# Patient Record
Sex: Male | Born: 1945 | Hispanic: No | Marital: Married | Smoking: Former smoker
Health system: Southern US, Community
[De-identification: ages and names within clinical notes are randomized; demographics above are authoritative.]

## PROBLEM LIST (undated history)

## (undated) DIAGNOSIS — K5792 Diverticulitis of intestine, part unspecified, without perforation or abscess without bleeding: Secondary | ICD-10-CM

## (undated) DIAGNOSIS — E785 Hyperlipidemia, unspecified: Secondary | ICD-10-CM

## (undated) DIAGNOSIS — M1711 Unilateral primary osteoarthritis, right knee: Secondary | ICD-10-CM

## (undated) DIAGNOSIS — Z974 Presence of external hearing-aid: Secondary | ICD-10-CM

## (undated) DIAGNOSIS — I10 Essential (primary) hypertension: Secondary | ICD-10-CM

## (undated) DIAGNOSIS — F17200 Nicotine dependence, unspecified, uncomplicated: Secondary | ICD-10-CM

## (undated) DIAGNOSIS — C801 Malignant (primary) neoplasm, unspecified: Secondary | ICD-10-CM

## (undated) DIAGNOSIS — Z973 Presence of spectacles and contact lenses: Secondary | ICD-10-CM

## (undated) DIAGNOSIS — H269 Unspecified cataract: Secondary | ICD-10-CM

## (undated) DIAGNOSIS — I4891 Unspecified atrial fibrillation: Secondary | ICD-10-CM

## (undated) DIAGNOSIS — S0291XA Unspecified fracture of skull, initial encounter for closed fracture: Secondary | ICD-10-CM

## (undated) HISTORY — PX: CATARACT EXTRACTION W/ INTRAOCULAR LENS  IMPLANT, BILATERAL: SHX1307

## (undated) HISTORY — DX: Unspecified cataract: H26.9

## (undated) HISTORY — DX: Hyperlipidemia, unspecified: E78.5

## (undated) HISTORY — DX: Malignant (primary) neoplasm, unspecified: C80.1

## (undated) HISTORY — DX: Essential (primary) hypertension: I10

## (undated) HISTORY — PX: COLONOSCOPY: SHX174

## (undated) HISTORY — PX: EYE SURGERY: SHX253

## (undated) HISTORY — DX: Nicotine dependence, unspecified, uncomplicated: F17.200

---

## 1999-04-29 ENCOUNTER — Observation Stay (HOSPITAL_COMMUNITY): Admission: EM | Admit: 1999-04-29 | Discharge: 1999-04-30 | Payer: Self-pay | Admitting: *Deleted

## 1999-04-29 ENCOUNTER — Encounter: Payer: Self-pay | Admitting: Ophthalmology

## 1999-05-29 ENCOUNTER — Ambulatory Visit (HOSPITAL_COMMUNITY): Admission: RE | Admit: 1999-05-29 | Discharge: 1999-05-30 | Payer: Self-pay | Admitting: Ophthalmology

## 2001-02-25 ENCOUNTER — Other Ambulatory Visit: Admission: RE | Admit: 2001-02-25 | Discharge: 2001-02-25 | Payer: Self-pay | Admitting: Gastroenterology

## 2007-02-05 ENCOUNTER — Ambulatory Visit: Payer: Self-pay | Admitting: Gastroenterology

## 2007-02-17 ENCOUNTER — Ambulatory Visit: Payer: Self-pay | Admitting: Gastroenterology

## 2012-01-23 ENCOUNTER — Other Ambulatory Visit: Payer: Self-pay | Admitting: Family Medicine

## 2012-01-23 ENCOUNTER — Ambulatory Visit
Admission: RE | Admit: 2012-01-23 | Discharge: 2012-01-23 | Disposition: A | Discharge status: Home / Self Care | Payer: 59 | Source: Ambulatory Visit | Attending: Family Medicine | Admitting: Family Medicine

## 2012-01-23 DIAGNOSIS — M25569 Pain in unspecified knee: Secondary | ICD-10-CM

## 2012-02-06 ENCOUNTER — Encounter: Payer: Self-pay | Admitting: Gastroenterology

## 2012-02-10 ENCOUNTER — Other Ambulatory Visit: Payer: Self-pay | Admitting: Family Medicine

## 2012-02-10 DIAGNOSIS — M25562 Pain in left knee: Secondary | ICD-10-CM

## 2012-02-12 ENCOUNTER — Ambulatory Visit
Admission: RE | Admit: 2012-02-12 | Discharge: 2012-02-12 | Disposition: A | Discharge status: Home / Self Care | Payer: Self-pay | Source: Ambulatory Visit | Attending: Family Medicine | Admitting: Family Medicine

## 2012-02-12 DIAGNOSIS — M25562 Pain in left knee: Secondary | ICD-10-CM

## 2012-02-27 DIAGNOSIS — M25569 Pain in unspecified knee: Secondary | ICD-10-CM | POA: Diagnosis not present

## 2012-02-27 DIAGNOSIS — IMO0002 Reserved for concepts with insufficient information to code with codable children: Secondary | ICD-10-CM | POA: Diagnosis not present

## 2012-03-09 HISTORY — PX: KNEE ARTHROSCOPY: SUR90

## 2012-03-18 DIAGNOSIS — M9420 Chondromalacia, unspecified site: Secondary | ICD-10-CM | POA: Diagnosis not present

## 2012-03-18 DIAGNOSIS — S83289A Other tear of lateral meniscus, current injury, unspecified knee, initial encounter: Secondary | ICD-10-CM | POA: Diagnosis not present

## 2012-03-18 DIAGNOSIS — M2240 Chondromalacia patellae, unspecified knee: Secondary | ICD-10-CM | POA: Diagnosis not present

## 2012-03-18 DIAGNOSIS — M2390 Unspecified internal derangement of unspecified knee: Secondary | ICD-10-CM | POA: Diagnosis not present

## 2012-03-18 DIAGNOSIS — X58XXXA Exposure to other specified factors, initial encounter: Secondary | ICD-10-CM | POA: Diagnosis not present

## 2012-03-18 DIAGNOSIS — IMO0002 Reserved for concepts with insufficient information to code with codable children: Secondary | ICD-10-CM | POA: Diagnosis not present

## 2012-03-18 DIAGNOSIS — Y929 Unspecified place or not applicable: Secondary | ICD-10-CM | POA: Diagnosis not present

## 2012-07-07 DIAGNOSIS — I10 Essential (primary) hypertension: Secondary | ICD-10-CM | POA: Diagnosis not present

## 2012-07-07 DIAGNOSIS — E785 Hyperlipidemia, unspecified: Secondary | ICD-10-CM | POA: Diagnosis not present

## 2012-07-08 DIAGNOSIS — Z125 Encounter for screening for malignant neoplasm of prostate: Secondary | ICD-10-CM | POA: Diagnosis not present

## 2012-07-08 DIAGNOSIS — I10 Essential (primary) hypertension: Secondary | ICD-10-CM | POA: Diagnosis not present

## 2012-07-08 DIAGNOSIS — E559 Vitamin D deficiency, unspecified: Secondary | ICD-10-CM | POA: Diagnosis not present

## 2012-07-08 DIAGNOSIS — E785 Hyperlipidemia, unspecified: Secondary | ICD-10-CM | POA: Diagnosis not present

## 2012-07-21 ENCOUNTER — Encounter: Payer: Self-pay | Admitting: Gastroenterology

## 2012-08-27 ENCOUNTER — Ambulatory Visit (AMBULATORY_SURGERY_CENTER): Payer: Medicare Other | Admitting: *Deleted

## 2012-08-27 VITALS — Ht 66.0 in | Wt 177.9 lb

## 2012-08-27 DIAGNOSIS — Z1211 Encounter for screening for malignant neoplasm of colon: Secondary | ICD-10-CM

## 2012-08-27 MED ORDER — MOVIPREP 100 G PO SOLR: ORAL | Status: DC

## 2012-09-10 ENCOUNTER — Ambulatory Visit (AMBULATORY_SURGERY_CENTER): Payer: Medicare Other | Admitting: Gastroenterology

## 2012-09-10 ENCOUNTER — Encounter: Payer: Self-pay | Admitting: Gastroenterology

## 2012-09-10 VITALS — BP 142/76 | HR 65 | Temp 97.3°F | Resp 18 | Ht 66.0 in | Wt 177.0 lb

## 2012-09-10 DIAGNOSIS — Z1211 Encounter for screening for malignant neoplasm of colon: Secondary | ICD-10-CM

## 2012-09-10 DIAGNOSIS — I10 Essential (primary) hypertension: Secondary | ICD-10-CM | POA: Diagnosis not present

## 2012-09-10 DIAGNOSIS — E785 Hyperlipidemia, unspecified: Secondary | ICD-10-CM | POA: Diagnosis not present

## 2012-09-10 DIAGNOSIS — D126 Benign neoplasm of colon, unspecified: Secondary | ICD-10-CM

## 2012-09-10 DIAGNOSIS — Z860100 Personal history of colon polyps, unspecified: Secondary | ICD-10-CM

## 2012-09-10 MED ORDER — SODIUM CHLORIDE 0.9 % IV SOLN: 500.0000 mL | INTRAVENOUS | Status: DC

## 2012-09-10 NOTE — Progress Notes (Signed)
B/p low once before procedure started.  Rechecked and back to baseline (malpositioning of cuff).

## 2012-09-10 NOTE — Patient Instructions (Addendum)

## 2012-09-10 NOTE — Op Note (Signed)
[facility] [address] [place], [address]   COLONOSCOPY PROCEDURE REPORT  PATIENT: Nicholas Huffman, Nicholas Huffman  MR#: [id number] BIRTHDATE: [birth year] , 66  yrs. old GENDER: Male ENDOSCOPIST: Meryl Dare, MD, [facility]  PROCEDURE DATE:  09/10/2012 PROCEDURE:   Colonoscopy with snare polypectomy ASA CLASS:   Class II INDICATIONS: patient's personal history of adenomatous colon polyps, 2002, 2008. MEDICATIONS: MAC sedation, administered by CRNA and propofol (Diprivan) 400mg  IV DESCRIPTION OF PROCEDURE:   After the risks benefits and alternatives of the procedure were thoroughly explained, informed consent was obtained.  A digital rectal exam revealed no abnormalities of the rectum.   The LB [id number]  endoscope was introduced through the anus and advanced to the cecum, which was identified by both the appendix and ileocecal valve. No adverse events experienced.   The quality of the prep was adequate, using MoviPrep  The instrument was then slowly withdrawn as the colon was fully examined.    COLON FINDINGS: A sessile polyp measuring 1.6 cm was found at the cecum.  A piecemeal polypectomy was performed using snare cautery. The resection appeared to be complete and the polyp tissue was completely retrieved.   A sessile polyp measuring 2 cm in size was found in the ascending colon.  A piecemeal polypectomy was performed using snare cautery.  The resection appeared to be complete and the polyp tissue was completely retrieved.   A sessile polyp measuring 1.5 cm in size was found in the proximal transverse colon.  A piecemeal polypectomy was performed using snare cautery. The resection appeared to be complete and the polyp tissue was completely retrieved.   A sessile polyp measuring 1.5 cm in size was found in the distal transverse colon.  A piecemeal polypectomy was performed using snare cautery. The resection appeared to be complete and the polyps tissue was  completely retrieved. 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   A small angiodysplastic lesion was found in the ascending colon. The colon was otherwise normal.  There was no diverticulosis, inflammation, polyps or cancers unless previously stated.  Mild diverticulosis was noted in the sigmoid colon.  Retroflexed views revealed no abnormalities. The time to cecum=3 minutes 57 seconds.  Withdrawal time=20 minutes 00 seconds.  The scope was withdrawn and the procedure completed.   COMPLICATIONS: There were no complications.  ENDOSCOPIC IMPRESSION: 1.   Sessile polyp was found at the cecum; polypectomy was performed using snare cautery 2.   Sessile polyp was found in the ascending colon; polypectomy was performed using snare cautery 3.   Sessile polyp was foundin the proximal transverse colon; polypectomy was performed using snare cautery 4.   Sessile polyp was found in the distal transverse colon; polypectomy was performed using snare cautery 5.   3 medium sized angiodysplastic lesion at the cecum 6.   Small sized angiodysplastic lesion in the ascending colon 7.   Mild diverticulosis was noted in the sigmoid colon  RECOMMENDATIONS: 1.  Hold aspirin, aspirin products, and anti-inflammatory medication for 2 weeks. 2.  Await pathology results 3.  Repeat Colonoscopy in 6 months pending pathology review.   eSigned:  Meryl Dare, MD, [facility] 09/10/2012 9:51 AM   cc: Lynnea Ferrier, MD   PATIENT NAME:  Nicholas Huffman, Nicholas Huffman MR#: [id number]

## 2012-09-11 ENCOUNTER — Telehealth: Payer: Self-pay | Admitting: *Deleted

## 2012-09-11 NOTE — Telephone Encounter (Signed)
  Follow up Call-  Call back number 09/10/2012  Post procedure Call Back phone  # [phone]  Permission to leave phone message Yes   Spoke with pt's wife  Patient questions:  Do you have a fever, pain , or abdominal swelling? no Pain Score  0 *  Have you tolerated food without any problems? yes  Have you been able to return to your normal activities? yes  Do you have any questions about your discharge instructions: Diet   no Medications  no Follow up visit  no  Do you have questions or concerns about your Care? no  Actions: * If pain score is 4 or above: No action needed, pain <4.

## 2012-09-16 ENCOUNTER — Encounter: Payer: Self-pay | Admitting: Gastroenterology

## 2012-10-20 DIAGNOSIS — M25569 Pain in unspecified knee: Secondary | ICD-10-CM | POA: Diagnosis not present

## 2012-11-10 ENCOUNTER — Other Ambulatory Visit: Payer: Self-pay | Admitting: Family Medicine

## 2012-11-10 DIAGNOSIS — M25561 Pain in right knee: Secondary | ICD-10-CM

## 2012-11-14 ENCOUNTER — Ambulatory Visit
Admission: RE | Admit: 2012-11-14 | Discharge: 2012-11-14 | Disposition: A | Discharge status: Home / Self Care | Payer: Medicare Other | Source: Ambulatory Visit | Attending: Family Medicine | Admitting: Family Medicine

## 2012-11-14 DIAGNOSIS — M25469 Effusion, unspecified knee: Secondary | ICD-10-CM | POA: Diagnosis not present

## 2012-11-14 DIAGNOSIS — M7120 Synovial cyst of popliteal space [Baker], unspecified knee: Secondary | ICD-10-CM | POA: Diagnosis not present

## 2012-11-14 DIAGNOSIS — M1710 Unilateral primary osteoarthritis, unspecified knee: Secondary | ICD-10-CM | POA: Diagnosis not present

## 2012-11-14 DIAGNOSIS — M25561 Pain in right knee: Secondary | ICD-10-CM

## 2012-12-03 DIAGNOSIS — M2390 Unspecified internal derangement of unspecified knee: Secondary | ICD-10-CM | POA: Diagnosis not present

## 2012-12-07 DIAGNOSIS — M9420 Chondromalacia, unspecified site: Secondary | ICD-10-CM | POA: Diagnosis not present

## 2012-12-07 DIAGNOSIS — M2390 Unspecified internal derangement of unspecified knee: Secondary | ICD-10-CM | POA: Diagnosis not present

## 2012-12-07 DIAGNOSIS — M23305 Other meniscus derangements, unspecified medial meniscus, unspecified knee: Secondary | ICD-10-CM | POA: Diagnosis not present

## 2012-12-07 DIAGNOSIS — M23302 Other meniscus derangements, unspecified lateral meniscus, unspecified knee: Secondary | ICD-10-CM | POA: Diagnosis not present

## 2012-12-07 DIAGNOSIS — M2240 Chondromalacia patellae, unspecified knee: Secondary | ICD-10-CM | POA: Diagnosis not present

## 2013-01-08 DIAGNOSIS — Z961 Presence of intraocular lens: Secondary | ICD-10-CM | POA: Diagnosis not present

## 2013-01-18 DIAGNOSIS — J40 Bronchitis, not specified as acute or chronic: Secondary | ICD-10-CM | POA: Diagnosis not present

## 2013-01-18 DIAGNOSIS — J069 Acute upper respiratory infection, unspecified: Secondary | ICD-10-CM | POA: Diagnosis not present

## 2013-04-01 ENCOUNTER — Encounter: Payer: Self-pay | Admitting: Gastroenterology

## 2013-04-19 ENCOUNTER — Encounter: Payer: Self-pay | Admitting: Family Medicine

## 2013-04-19 ENCOUNTER — Ambulatory Visit: Payer: Self-pay | Admitting: Family Medicine

## 2013-04-19 ENCOUNTER — Ambulatory Visit (INDEPENDENT_AMBULATORY_CARE_PROVIDER_SITE_OTHER): Payer: Medicare Other | Admitting: Family Medicine

## 2013-04-19 VITALS — BP 120/64 | HR 68 | Temp 98.2°F | Resp 14 | Wt 176.0 lb

## 2013-04-19 DIAGNOSIS — M25561 Pain in right knee: Secondary | ICD-10-CM

## 2013-04-19 DIAGNOSIS — M25569 Pain in unspecified knee: Secondary | ICD-10-CM

## 2013-04-19 DIAGNOSIS — E785 Hyperlipidemia, unspecified: Secondary | ICD-10-CM | POA: Insufficient documentation

## 2013-04-19 DIAGNOSIS — I10 Essential (primary) hypertension: Secondary | ICD-10-CM | POA: Insufficient documentation

## 2013-04-19 DIAGNOSIS — F17200 Nicotine dependence, unspecified, uncomplicated: Secondary | ICD-10-CM | POA: Insufficient documentation

## 2013-04-19 NOTE — Progress Notes (Signed)
  Subjective:    Patient ID: Nicholas Huffman, male    DOB: 1946-11-25, 67 y.o.   MRN: [id number]  HPI  Patient is here with one month of progressively worsening right knee pain. The pain is located over his medial joint line. He is a history of a medial meniscal tear. This was discovered on an MRI in November of 2013.  He underwent arthroscopy and repair. He's done well with pain in that knee for up until one month ago.  Now he complains of medial joint line pain and tenderness. He reports effusion and warmth around the knee. The pain is worse with twisting motions. It is worse with prolonged walking and standing. It is worse rising from a seated position. He denies any specific injury. 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 Past Medical History  Diagnosis Date  . Hyperlipidemia   . Hypertension   . Smoker    Current Outpatient Prescriptions on File Prior to Visit  Medication Sig Dispense Refill  . amLODipine (NORVASC) 10 MG tablet Take 10 mg by mouth daily.       Marland Kitchen atorvastatin (LIPITOR) 40 MG tablet Take 40 mg by mouth daily.       Marland Kitchen losartan-hydrochlorothiazide (HYZAAR) 100-25 MG per tablet Take 1 tablet by mouth daily.        No current facility-administered medications on file prior to visit.   No Known Allergies   Review of Systems Review of systems is otherwise negative    Objective:   Physical Exam  Cardiovascular: Normal rate, regular rhythm and normal heart sounds.   No murmur heard. Pulmonary/Chest: Effort normal and breath sounds normal. No respiratory distress. He has no wheezes. He has no rales.   right knee is warm to the touch. He has a slight effusion. He has no laxity to varus or valgus stress. Has a negative anterior posterior drawer sign. Has a negative Apley grind test. However he has tenderness over the medial joint line.        Assessment & Plan:  1. Knee pain, acute, right I explained to the  patient that I feel this is likely due to osteoarthritis and not a repeat injury to his meniscus. However he has an orthopedist. Rather than perform a cortisone injection here, I deferred this to his orthopedist's expert opinion. I recommended the patient contact his orthopedist and arrange a followup appointment.  He has tried ibuprofen over-the-counter without success. Patient states he will cause orthopedist and arrange his followup.

## 2013-04-27 DIAGNOSIS — M25569 Pain in unspecified knee: Secondary | ICD-10-CM | POA: Diagnosis not present

## 2013-07-25 ENCOUNTER — Other Ambulatory Visit: Payer: Self-pay | Admitting: Family Medicine

## 2013-09-08 ENCOUNTER — Encounter: Payer: Self-pay | Admitting: Gastroenterology

## 2013-11-09 ENCOUNTER — Ambulatory Visit (AMBULATORY_SURGERY_CENTER): Payer: Self-pay

## 2013-11-09 VITALS — Ht 66.0 in | Wt 183.4 lb

## 2013-11-09 DIAGNOSIS — Z86010 Personal history of colonic polyps: Secondary | ICD-10-CM

## 2013-11-09 MED ORDER — MOVIPREP 100 G PO SOLR: ORAL | Status: DC

## 2013-11-23 ENCOUNTER — Encounter: Payer: Self-pay | Admitting: Gastroenterology

## 2013-11-23 ENCOUNTER — Ambulatory Visit (AMBULATORY_SURGERY_CENTER): Payer: Medicare Other | Admitting: Gastroenterology

## 2013-11-23 VITALS — BP 123/66 | HR 65 | Temp 97.3°F | Resp 21 | Ht 66.0 in | Wt 183.0 lb

## 2013-11-23 DIAGNOSIS — Z1211 Encounter for screening for malignant neoplasm of colon: Secondary | ICD-10-CM | POA: Diagnosis not present

## 2013-11-23 DIAGNOSIS — D126 Benign neoplasm of colon, unspecified: Secondary | ICD-10-CM | POA: Diagnosis not present

## 2013-11-23 DIAGNOSIS — I10 Essential (primary) hypertension: Secondary | ICD-10-CM | POA: Diagnosis not present

## 2013-11-23 DIAGNOSIS — Z86010 Personal history of colonic polyps: Secondary | ICD-10-CM | POA: Diagnosis not present

## 2013-11-23 MED ORDER — SODIUM CHLORIDE 0.9 % IV SOLN: 500.0000 mL | INTRAVENOUS | Status: DC

## 2013-11-23 NOTE — Progress Notes (Signed)
Called to room to assist during endoscopic procedure.  Patient ID and intended procedure confirmed with present staff. Received instructions for my participation in the procedure from the performing physician.  

## 2013-11-23 NOTE — Progress Notes (Signed)
Patient did not have preoperative order for IV antibiotic SSI prophylaxis. (G8918)  Patient did not experience any of the following events: a burn prior to discharge; a fall within the facility; wrong site/side/patient/procedure/implant event; or a hospital transfer or hospital admission upon discharge from the facility. (G8907)  

## 2013-11-23 NOTE — Op Note (Signed)
[facility] [address] [place], [address]   COLONOSCOPY PROCEDURE REPORT PATIENT: [name], [name]  MR#: [id number] BIRTHDATE: [birth year] , 67  yrs. old GENDER: Male ENDOSCOPIST: Meryl Dare, MD, [facility] PROCEDURE DATE:  11/23/2013 PROCEDURE:   Colonoscopy with snare polypectomy First Screening Colonoscopy - Avg.  risk and is 50 yrs.  old or older - No.  Prior Negative Screening - Now for repeat screening. N/A  History of Adenoma - Now for follow-up colonoscopy & has been > or = to 3 yrs.  Yes hx of adenoma.  Has been 3 or more years since last colonoscopy.  Polyps Removed Today? Yes. ASA CLASS:   Class II INDICATIONS:Patient's personal history of adenomatous colon polyps.  MEDICATIONS: MAC sedation, administered by CRNA and propofol (Diprivan) 350mg  IV DESCRIPTION OF PROCEDURE:   After the risks benefits and alternatives of the procedure were thoroughly explained, informed consent was obtained.  A digital rectal exam revealed no abnormalities of the rectum.   The LB [id number]  endoscope was introduced through the anus and advanced to the cecum, which was identified by both the appendix and ileocecal valve. No adverse events experienced.   The quality of the prep was adequate, using MoviPrep  The instrument was then slowly withdrawn as the colon was fully examined.  COLON FINDINGS: Multiple AVMs were noted at the cecum and in the ascending colon.  Two sessile polyps measuring 5-6 mm in size were found at the hepatic flexure.  A polypectomy was performed with a cold snare.  The resection was complete and the polyp tissue was completely retrieved.   Two sessile polyps measuring 5-8 mm in size were found in the transverse colon.  A polypectomy was performed using snare cautery and with a cold snare.  The resection was complete and the polyp tissue was completely retrieved.   A sessile polyp measuring 5 mm in size was found in the descending colon.   A polypectomy was performed with a cold snare.  The resection was complete and the polyp tissue was completely retrieved.   The colon was otherwise normal.  There was no diverticulosis, inflammation, polyps or cancers unless previously stated.  Retroflexed views revealed moderate internal hemorrhoids and a hypertrophed anal papillae. The time to cecum=3 minutes 47 seconds.  Withdrawal time=12 minutes 12 seconds.  The scope was withdrawn and the procedure completed. COMPLICATIONS: There were no complications.  ENDOSCOPIC IMPRESSION: 1.   Multiple AVMs at the cecum and in the ascending colon 2.   Two sessile polyps measuring 5-6 mm at the hepatic flexure; polypectomy performed with a cold snare 3.   Two sessile polyps measuring 5-8 mm in the transverse colon; polypectomy performed using snare cautery and with a cold snare 4.   Sessile polyp measuring 5 mm in the descending colon; polypectomy performed with a cold snare 5.   Internal hemorrhoids and hypertrophied anal papillae  RECOMMENDATIONS: 1.  Await pathology results 2.  Repeat Colonoscopy in 3 years if 3 or more polyps adenomatous; otherwise 5 years.   eSigned:  Meryl Dare, MD, [facility] 11/23/2013 9:38 AM   cc: Lynnea Ferrier, MD   PATIENT NAME:  Nicholas Huffman, Nicholas Huffman MR#: [id number]

## 2013-11-23 NOTE — Patient Instructions (Signed)

## 2013-11-24 ENCOUNTER — Telehealth: Payer: Self-pay | Admitting: *Deleted

## 2013-11-24 NOTE — Telephone Encounter (Signed)
  Follow up Call-  Call back number 11/23/2013 09/10/2012  Post procedure Call Back phone  # [phone]  Permission to leave phone message Yes Yes     Patient questions:  Do you have a fever, pain , or abdominal swelling? no Pain Score  0 *  Have you tolerated food without any problems? yes  Have you been able to return to your normal activities? yes  Do you have any questions about your discharge instructions: Diet   no Medications  no Follow up visit  no  Do you have questions or concerns about your Care? no  Actions: * If pain score is 4 or above: No action needed, pain <4.  Stated everything was great.

## 2013-11-30 ENCOUNTER — Encounter: Payer: Self-pay | Admitting: Gastroenterology

## 2014-01-04 ENCOUNTER — Other Ambulatory Visit: Payer: Self-pay | Admitting: Family Medicine

## 2014-02-07 ENCOUNTER — Other Ambulatory Visit: Payer: Self-pay | Admitting: Family Medicine

## 2014-02-08 NOTE — Telephone Encounter (Signed)
Medication filled x1 with no refills.   Requires office visit before any further refills can be given.   Letter sent.  

## 2014-02-21 ENCOUNTER — Encounter: Payer: Self-pay | Admitting: Family Medicine

## 2014-02-21 ENCOUNTER — Other Ambulatory Visit: Payer: Self-pay | Admitting: Family Medicine

## 2014-02-21 ENCOUNTER — Ambulatory Visit (INDEPENDENT_AMBULATORY_CARE_PROVIDER_SITE_OTHER): Payer: Medicare Other | Admitting: Family Medicine

## 2014-02-21 VITALS — BP 118/60 | HR 76 | Temp 98.2°F | Resp 14 | Ht 67.0 in | Wt 184.0 lb

## 2014-02-21 DIAGNOSIS — I10 Essential (primary) hypertension: Secondary | ICD-10-CM | POA: Diagnosis not present

## 2014-02-21 DIAGNOSIS — E785 Hyperlipidemia, unspecified: Secondary | ICD-10-CM

## 2014-02-21 LAB — LIPID PANEL
CHOL/HDL RATIO: 2.7 ratio
CHOLESTEROL: 102 mg/dL (ref 0–200)
HDL: 38 mg/dL — ABNORMAL LOW (ref >39)
LDL CALC: 43 mg/dL (ref 0–99)
TRIGLYCERIDES: 103 mg/dL (ref <150)
VLDL: 21 mg/dL (ref 0–40)

## 2014-02-21 LAB — COMPLETE METABOLIC PANEL WITH GFR
ALK PHOS: 57 U/L (ref 39–117)
ALT: 22 U/L (ref 0–53)
AST: 21 U/L (ref 0–37)
Albumin: 4.4 g/dL (ref 3.5–5.2)
BUN: 23 mg/dL (ref 6–23)
CO2: 26 mEq/L (ref 19–32)
CREATININE: 1.17 mg/dL (ref 0.50–1.35)
Calcium: 9.7 mg/dL (ref 8.4–10.5)
Chloride: 102 mEq/L (ref 96–112)
GFR, Est African American: 74 mL/min
GFR, Est Non African American: 64 mL/min
Glucose, Bld: 103 mg/dL — ABNORMAL HIGH (ref 70–99)
POTASSIUM: 3.8 meq/L (ref 3.5–5.3)
Sodium: 136 mEq/L (ref 135–145)
Total Bilirubin: 0.7 mg/dL (ref 0.2–1.2)
Total Protein: 6.9 g/dL (ref 6.0–8.3)

## 2014-02-21 LAB — CBC WITH DIFFERENTIAL/PLATELET
BASOS ABS: 0.1 10*3/uL (ref 0.0–0.1)
Basophils Relative: 1 % (ref 0–1)
EOS ABS: 0.3 10*3/uL (ref 0.0–0.7)
Eosinophils Relative: 4 % (ref 0–5)
HCT: 48.2 % (ref 39.0–52.0)
HEMOGLOBIN: 16.7 g/dL (ref 13.0–17.0)
Lymphocytes Relative: 22 % (ref 12–46)
Lymphs Abs: 1.7 10*3/uL (ref 0.7–4.0)
MCH: 30.6 pg (ref 26.0–34.0)
MCHC: 34.6 g/dL (ref 30.0–36.0)
MCV: 88.4 fL (ref 78.0–100.0)
MONOS PCT: 7 % (ref 3–12)
Monocytes Absolute: 0.6 10*3/uL (ref 0.1–1.0)
Neutro Abs: 5.2 10*3/uL (ref 1.7–7.7)
Neutrophils Relative %: 66 % (ref 43–77)
PLATELETS: 300 10*3/uL (ref 150–400)
RBC: 5.45 MIL/uL (ref 4.22–5.81)
RDW: 14.1 % (ref 11.5–15.5)
WBC: 7.9 10*3/uL (ref 4.0–10.5)

## 2014-02-21 MED ORDER — LOSARTAN POTASSIUM-HCTZ 100-25 MG PO TABS: ORAL_TABLET | ORAL | Status: DC

## 2014-02-21 MED ORDER — ATORVASTATIN CALCIUM 40 MG PO TABS: ORAL_TABLET | ORAL | Status: DC

## 2014-02-21 MED ORDER — AMLODIPINE BESYLATE 10 MG PO TABS: 10.0000 mg | ORAL_TABLET | Freq: Every day | ORAL | Status: DC

## 2014-02-21 NOTE — Progress Notes (Signed)
   Subjective:    Patient ID: Nicholas Huffman, male    DOB: 1946/07/29, 68 y.o.   MRN: [id number]  HPI  Patient is a very pleasant 68 year old white male who comes in today for followup of his hypertension and hyperlipidemia. His medication list is reviewed. He denies any chest pain shortness of breath or dyspnea on exertion. He denies any myalgia right upper quadrant pain. The patient had Pneumovax 23. He is due for the shingles vaccine as well as Prevnar. His colonoscopy is up to date. His prostate cancer screening is up to date. He has no concerns today. He continues to smoke and is in the pre-contemplative phase to smoking cessation.  He is not interested in quitting strategies at this time. Past Medical History  Diagnosis Date  . Hyperlipidemia   . Hypertension   . Smoker    No current outpatient prescriptions on file prior to visit.   No current facility-administered medications on file prior to visit.   No Known Allergies History   Social History  . Marital Status: Married    Spouse Name: N/A    Number of Children: N/A  . Years of Education: N/A   Occupational History  . Not on file.   Social History Main Topics  . Smoking status: Current Every Day Smoker -- 0.80 packs/day    Types: Cigarettes  . Smokeless tobacco: Never Used  . Alcohol Use: No  . Drug Use: No  . Sexual Activity: Not on file   Other Topics Concern  . Not on file   Social History Narrative  . No narrative on file     Review of Systems  All other systems reviewed and are negative.       Objective:   Physical Exam  Vitals reviewed. Constitutional: He appears well-developed and well-nourished.  Neck: Neck supple. No JVD present. No thyromegaly present.  Cardiovascular: Normal rate, regular rhythm, normal heart sounds and intact distal pulses.  Exam reveals no gallop and no friction rub.   No murmur heard. Pulmonary/Chest: Effort normal and breath sounds normal. No respiratory distress. He has  no wheezes. He has no rales. He exhibits no tenderness.  Abdominal: Soft. Bowel sounds are normal. He exhibits no distension and no mass. There is no tenderness. There is no rebound and no guarding.  Musculoskeletal: He exhibits no edema.  Lymphadenopathy:    He has no cervical adenopathy.          Assessment & Plan:  1. HTN (hypertension) Patient's blood pressure is outstanding. Continue current medications at the present dosages. - COMPLETE METABOLIC PANEL WITH GFR - Lipid panel - CBC with Differential  2. HLD (hyperlipidemia) Check fasting lipid panel. Given the patient's age, his blood pressure, and his smoking, I recommend a goal LDL less than 100. I also recommended Prevnar 13. I also recommended the shingles vaccine. I did recommend smoking cessation. - COMPLETE METABOLIC PANEL WITH GFR - Lipid panel - CBC with Differential

## 2014-02-21 NOTE — Telephone Encounter (Signed)
Refill appropriate and filled per protocol. 

## 2014-02-24 DIAGNOSIS — H33059 Total retinal detachment, unspecified eye: Secondary | ICD-10-CM | POA: Diagnosis not present

## 2014-02-24 DIAGNOSIS — Z961 Presence of intraocular lens: Secondary | ICD-10-CM | POA: Diagnosis not present

## 2014-04-29 ENCOUNTER — Other Ambulatory Visit: Payer: Self-pay | Admitting: Family Medicine

## 2014-04-29 DIAGNOSIS — E785 Hyperlipidemia, unspecified: Secondary | ICD-10-CM

## 2014-04-29 DIAGNOSIS — I10 Essential (primary) hypertension: Secondary | ICD-10-CM

## 2014-04-29 NOTE — Telephone Encounter (Signed)
Medication refilled per protocol. 

## 2014-11-12 ENCOUNTER — Other Ambulatory Visit: Payer: Self-pay | Admitting: Family Medicine

## 2014-11-18 ENCOUNTER — Encounter: Payer: Self-pay | Admitting: Family Medicine

## 2014-11-18 ENCOUNTER — Ambulatory Visit (INDEPENDENT_AMBULATORY_CARE_PROVIDER_SITE_OTHER): Payer: Medicare Other | Admitting: Family Medicine

## 2014-11-18 VITALS — BP 126/60 | HR 76 | Temp 98.1°F | Resp 18 | Ht 65.5 in | Wt 182.0 lb

## 2014-11-18 DIAGNOSIS — Z23 Encounter for immunization: Secondary | ICD-10-CM | POA: Diagnosis not present

## 2014-11-18 DIAGNOSIS — Z125 Encounter for screening for malignant neoplasm of prostate: Secondary | ICD-10-CM

## 2014-11-18 DIAGNOSIS — E785 Hyperlipidemia, unspecified: Secondary | ICD-10-CM | POA: Diagnosis not present

## 2014-11-18 LAB — COMPLETE METABOLIC PANEL WITH GFR
ALT: 21 U/L (ref 0–53)
AST: 20 U/L (ref 0–37)
Albumin: 4.4 g/dL (ref 3.5–5.2)
Alkaline Phosphatase: 55 U/L (ref 39–117)
BILIRUBIN TOTAL: 0.6 mg/dL (ref 0.2–1.2)
BUN: 22 mg/dL (ref 6–23)
CO2: 26 mEq/L (ref 19–32)
CREATININE: 1.1 mg/dL (ref 0.50–1.35)
Calcium: 9.7 mg/dL (ref 8.4–10.5)
Chloride: 103 mEq/L (ref 96–112)
GFR, Est African American: 79 mL/min
GFR, Est Non African American: 69 mL/min
Glucose, Bld: 94 mg/dL (ref 70–99)
Potassium: 4 mEq/L (ref 3.5–5.3)
Sodium: 140 mEq/L (ref 135–145)
Total Protein: 6.8 g/dL (ref 6.0–8.3)

## 2014-11-18 LAB — LIPID PANEL
Cholesterol: 96 mg/dL (ref 0–200)
HDL: 37 mg/dL — AB (ref >39)
LDL CALC: 38 mg/dL (ref 0–99)
Total CHOL/HDL Ratio: 2.6 Ratio
Triglycerides: 103 mg/dL (ref <150)
VLDL: 21 mg/dL (ref 0–40)

## 2014-11-18 NOTE — Progress Notes (Signed)
   Subjective:    Patient ID: Nicholas Huffman, male    DOB: 04-27-1946, 68 y.o.   MRN: [id number]  HPI Patient is here today for follow-up of his hypertension and hyperlipidemia. Unfortunately he continues to smoke. He denies any chest pain shortness of breath or dyspnea on exertion. He denies any myalgias or right upper quadrant pain on his Lipitor. He is due for a booster on his Pneumovax 23. His blood pressures well controlled today at 126/60. Past Medical History  Diagnosis Date  . Hyperlipidemia   . Hypertension   . Smoker    Past Surgical History  Procedure Laterality Date  . Knee arthroscopy  April/2013    left  . Colonoscopy  2008, 2002    hx polyps/Dr. Fuller Plan   Current Outpatient Prescriptions on File Prior to Visit  Medication Sig Dispense Refill  . amLODipine (NORVASC) 10 MG tablet TAKE 1 TABLET BY MOUTH EVERY DAY 90 tablet 3  . aspirin 81 MG tablet Take 81 mg by mouth daily.    Marland Kitchen atorvastatin (LIPITOR) 40 MG tablet TAKE 1 TABLET DAILY AT 6 PM 90 tablet 0  . losartan-hydrochlorothiazide (HYZAAR) 100-25 MG per tablet TAKE 1 TABLET BY MOUTH ONCE DAILY 90 tablet 0  . Misc Natural Products (OSTEO BI-FLEX ADV DOUBLE ST PO) Take by mouth.     No current facility-administered medications on file prior to visit.   No Known Allergies History   Social History  . Marital Status: Married    Spouse Name: N/A    Number of Children: N/A  . Years of Education: N/A   Occupational History  . Not on file.   Social History Main Topics  . Smoking status: Current Every Day Smoker -- 0.80 packs/day    Types: Cigarettes  . Smokeless tobacco: Never Used  . Alcohol Use: No  . Drug Use: No  . Sexual Activity: Not on file   Other Topics Concern  . Not on file   Social History Narrative      Review of Systems  All other systems reviewed and are negative.      Objective:   Physical Exam  Cardiovascular: Normal rate, regular rhythm and normal heart sounds.   No murmur  heard. Pulmonary/Chest: Effort normal and breath sounds normal. No respiratory distress. He has no wheezes. He has no rales.  Abdominal: Soft. Bowel sounds are normal. He exhibits no distension. There is no tenderness. There is no rebound.  Musculoskeletal: He exhibits no edema.  Vitals reviewed.         Assessment & Plan:  HLD (hyperlipidemia) - Plan: COMPLETE METABOLIC PANEL WITH GFR, Lipid panel  Prostate cancer screening  Need for prophylactic vaccination against Streptococcus pneumoniae (pneumococcus) - Plan: Pneumococcal polysaccharide vaccine 23-valent greater than or equal to 2yo subcutaneous/IM  Patient's blood pressure is well controlled. I will check a fasting lipid panel. Goal LDL cholesterol is less than 100. I strongly encouraged the patient quits. Patient received a booster on Pneumovax 23 today.

## 2014-12-09 HISTORY — PX: BASAL CELL CARCINOMA EXCISION: SHX1214

## 2015-02-19 ENCOUNTER — Other Ambulatory Visit: Payer: Self-pay | Admitting: Family Medicine

## 2015-03-28 DIAGNOSIS — H2000 Unspecified acute and subacute iridocyclitis: Secondary | ICD-10-CM | POA: Diagnosis not present

## 2015-03-30 DIAGNOSIS — H20012 Primary iridocyclitis, left eye: Secondary | ICD-10-CM | POA: Diagnosis not present

## 2015-04-03 DIAGNOSIS — H20012 Primary iridocyclitis, left eye: Secondary | ICD-10-CM | POA: Diagnosis not present

## 2015-04-10 ENCOUNTER — Other Ambulatory Visit: Payer: Self-pay | Admitting: Family Medicine

## 2015-04-10 DIAGNOSIS — H20012 Primary iridocyclitis, left eye: Secondary | ICD-10-CM | POA: Diagnosis not present

## 2015-04-20 DIAGNOSIS — H20012 Primary iridocyclitis, left eye: Secondary | ICD-10-CM | POA: Diagnosis not present

## 2015-06-20 ENCOUNTER — Encounter: Payer: Self-pay | Admitting: Gastroenterology

## 2015-08-14 ENCOUNTER — Other Ambulatory Visit: Payer: Self-pay | Admitting: Family Medicine

## 2015-10-18 DIAGNOSIS — H833X3 Noise effects on inner ear, bilateral: Secondary | ICD-10-CM | POA: Diagnosis not present

## 2015-10-18 DIAGNOSIS — H903 Sensorineural hearing loss, bilateral: Secondary | ICD-10-CM | POA: Diagnosis not present

## 2015-10-30 DIAGNOSIS — H00025 Hordeolum internum left lower eyelid: Secondary | ICD-10-CM | POA: Diagnosis not present

## 2015-12-07 ENCOUNTER — Ambulatory Visit (INDEPENDENT_AMBULATORY_CARE_PROVIDER_SITE_OTHER): Payer: Medicare Other | Admitting: Physician Assistant

## 2015-12-07 ENCOUNTER — Encounter: Payer: Self-pay | Admitting: Physician Assistant

## 2015-12-07 VITALS — BP 136/74 | HR 80 | Temp 99.7°F | Resp 20 | Wt 188.0 lb

## 2015-12-07 DIAGNOSIS — B349 Viral infection, unspecified: Secondary | ICD-10-CM

## 2015-12-07 DIAGNOSIS — B9789 Other viral agents as the cause of diseases classified elsewhere: Secondary | ICD-10-CM

## 2015-12-07 DIAGNOSIS — J988 Other specified respiratory disorders: Principal | ICD-10-CM

## 2015-12-07 MED ORDER — AZITHROMYCIN 250 MG PO TABS: ORAL_TABLET | ORAL | Status: DC

## 2015-12-07 NOTE — Progress Notes (Signed)
    Patient ID: Nicholas Huffman MRN: [id number], DOB: [birth year], 69 y.o. Date of Encounter: 12/07/2015, 3:46 PM    Chief Complaint:  Chief Complaint  Patient presents with  . sick x 1 day    sinus infection,cough     HPI: 69 y.o. year old white male says the symptoms just started yesterday. Cough and runny nose. Taking NyQuil with minimal relief. No known fever. No significant sore throat. No known sick contacts.     Home Meds:   Outpatient Prescriptions Prior to Visit  Medication Sig Dispense Refill  . amLODipine (NORVASC) 10 MG tablet TAKE 1 TABLET BY MOUTH EVERY DAY 90 tablet 3  . aspirin 81 MG tablet Take 81 mg by mouth daily.    Marland Kitchen atorvastatin (LIPITOR) 40 MG tablet TAKE 1 TABLET DAILY AT 6 PM 90 tablet 1  . losartan-hydrochlorothiazide (HYZAAR) 100-25 MG per tablet TAKE 1 TABLET BY MOUTH ONCE DAILY 90 tablet 1  . Misc Natural Products (OSTEO BI-FLEX ADV DOUBLE ST PO) Take by mouth. Reported on 12/07/2015     No facility-administered medications prior to visit.    Allergies: No Known Allergies    Review of Systems: See HPI for pertinent ROS. All other ROS negative.    Physical Exam: Blood pressure 136/74, pulse 80, temperature 99.7 F (37.6 C), temperature source Oral, resp. rate 20, weight 188 lb (85.276 kg)., Body mass index is 30.8 kg/(m^2). General:  WNWD WM. Appears in no acute distress. HEENT: Normocephalic, atraumatic, eyes without discharge, sclera non-icteric, nares are without discharge. Bilateral auditory canals clear, TM's are without perforation, pearly grey and translucent with reflective cone of light bilaterally. Oral cavity moist, posterior pharynx without exudate, erythema, peritonsillar abscess. Minimal tenderness with percussion to maxillary sinuses bilaterally. No tenderness with percussion of frontal sinuses.  Neck: Supple. No thyromegaly. No lymphadenopathy. Lungs: Clear bilaterally to auscultation without wheezes, rales, or rhonchi. Breathing is  unlabored. Heart: Regular rhythm. No murmurs, rubs, or gallops. Msk:  Strength and tone normal for age. Extremities/Skin: Warm and dry. Neuro: Alert and oriented X 3. Moves all extremities spontaneously. Gait is normal. CNII-XII grossly in tact. Psych:  Responds to questions appropriately with a normal affect.     ASSESSMENT AND PLAN:  69 y.o. year old male with  1. Viral respiratory infection Discussed with patient that most likely this is a viral infection which means that it will run its course and resolve on its own. Discussed that he can continue using NyQuil and over-the-counter medications for symptom relief in the interim. He is not happy with this. Told him that he can go home and monitor symptoms and call us if symptoms worsen significantly or fever increases. Told him that I can go ahead and send in antibiotic. I did not recommend he start it immediately. Recommended he monitor symptoms and monitor temperature and only if develops increased fever or increased symptoms significantly then start antibiotics. He prefers this option. If he ends up having to take the antibiotic, he is to take all of it. If symptoms do not resolve within 1 week after completion of antibiotic,  he is to follow-up with [place]. - azithromycin (ZITHROMAX) 250 MG tablet; Day 1: Take 2 daily.  Days 2-5: Take 1 daily.  Dispense: 6 tablet; Refill: 0   Signed, [address] [place], [place], [facility] 12/07/2015 3:46 PM

## 2015-12-20 DIAGNOSIS — H903 Sensorineural hearing loss, bilateral: Secondary | ICD-10-CM | POA: Diagnosis not present

## 2016-03-03 ENCOUNTER — Other Ambulatory Visit: Payer: Self-pay | Admitting: Family Medicine

## 2016-03-07 ENCOUNTER — Other Ambulatory Visit: Payer: Self-pay | Admitting: *Deleted

## 2016-03-07 ENCOUNTER — Encounter: Payer: Self-pay | Admitting: Family Medicine

## 2016-03-07 ENCOUNTER — Ambulatory Visit (INDEPENDENT_AMBULATORY_CARE_PROVIDER_SITE_OTHER): Payer: Medicare Other | Admitting: Family Medicine

## 2016-03-07 VITALS — BP 118/60 | HR 80 | Temp 98.0°F | Resp 16 | Ht 67.0 in | Wt 182.0 lb

## 2016-03-07 DIAGNOSIS — Z125 Encounter for screening for malignant neoplasm of prostate: Secondary | ICD-10-CM

## 2016-03-07 DIAGNOSIS — I10 Essential (primary) hypertension: Secondary | ICD-10-CM

## 2016-03-07 DIAGNOSIS — Z23 Encounter for immunization: Secondary | ICD-10-CM

## 2016-03-07 DIAGNOSIS — F17210 Nicotine dependence, cigarettes, uncomplicated: Secondary | ICD-10-CM

## 2016-03-07 DIAGNOSIS — F17200 Nicotine dependence, unspecified, uncomplicated: Secondary | ICD-10-CM

## 2016-03-07 DIAGNOSIS — E785 Hyperlipidemia, unspecified: Secondary | ICD-10-CM | POA: Diagnosis not present

## 2016-03-07 LAB — CBC WITH DIFFERENTIAL/PLATELET
BASOS ABS: 0.1 10*3/uL (ref 0.0–0.1)
Basophils Relative: 1 % (ref 0–1)
EOS ABS: 0.2 10*3/uL (ref 0.0–0.7)
Eosinophils Relative: 3 % (ref 0–5)
HEMATOCRIT: 48.4 % (ref 39.0–52.0)
HEMOGLOBIN: 16.4 g/dL (ref 13.0–17.0)
LYMPHS ABS: 1.5 10*3/uL (ref 0.7–4.0)
Lymphocytes Relative: 22 % (ref 12–46)
MCH: 30.9 pg (ref 26.0–34.0)
MCHC: 33.9 g/dL (ref 30.0–36.0)
MCV: 91.3 fL (ref 78.0–100.0)
MONOS PCT: 6 % (ref 3–12)
MPV: 10.6 fL (ref 8.6–12.4)
Monocytes Absolute: 0.4 10*3/uL (ref 0.1–1.0)
NEUTROS ABS: 4.6 10*3/uL (ref 1.7–7.7)
NEUTROS PCT: 68 % (ref 43–77)
Platelets: 185 10*3/uL (ref 150–400)
RBC: 5.3 MIL/uL (ref 4.22–5.81)
RDW: 14 % (ref 11.5–15.5)
WBC: 6.7 10*3/uL (ref 4.0–10.5)

## 2016-03-07 LAB — COMPLETE METABOLIC PANEL WITH GFR
ALBUMIN: 4.4 g/dL (ref 3.6–5.1)
ALK PHOS: 53 U/L (ref 40–115)
ALT: 16 U/L (ref 9–46)
AST: 17 U/L (ref 10–35)
BUN: 22 mg/dL (ref 7–25)
CHLORIDE: 104 mmol/L (ref 98–110)
CO2: 25 mmol/L (ref 20–31)
Calcium: 9.5 mg/dL (ref 8.6–10.3)
Creat: 1.09 mg/dL (ref 0.70–1.25)
GFR, EST NON AFRICAN AMERICAN: 69 mL/min (ref >=60)
GFR, Est African American: 80 mL/min (ref >=60)
Glucose, Bld: 93 mg/dL (ref 70–99)
POTASSIUM: 3.5 mmol/L (ref 3.5–5.3)
Sodium: 138 mmol/L (ref 135–146)
TOTAL PROTEIN: 6.7 g/dL (ref 6.1–8.1)
Total Bilirubin: 0.7 mg/dL (ref 0.2–1.2)

## 2016-03-07 LAB — LIPID PANEL
CHOL/HDL RATIO: 2.5 ratio (ref <=5.0)
Cholesterol: 99 mg/dL — ABNORMAL LOW (ref 125–200)
HDL: 39 mg/dL — ABNORMAL LOW (ref >=40)
LDL Cholesterol: 40 mg/dL (ref <130)
Triglycerides: 100 mg/dL (ref <150)
VLDL: 20 mg/dL (ref <30)

## 2016-03-07 MED ORDER — ATORVASTATIN CALCIUM 40 MG PO TABS: 40.0000 mg | ORAL_TABLET | Freq: Every day | ORAL | Status: DC

## 2016-03-07 MED ORDER — AMLODIPINE BESYLATE 10 MG PO TABS: 10.0000 mg | ORAL_TABLET | Freq: Every day | ORAL | Status: DC

## 2016-03-07 MED ORDER — LOSARTAN POTASSIUM-HCTZ 100-25 MG PO TABS: 1.0000 | ORAL_TABLET | Freq: Every day | ORAL | Status: DC

## 2016-03-07 NOTE — Telephone Encounter (Signed)
Received call from patient requesting refill on routine medications.   Refill appropriate and filled per protocol.

## 2016-03-07 NOTE — Addendum Note (Signed)
Addended by: Shary Decamp B on: 03/07/2016 09:04 AM   Modules accepted: Orders

## 2016-03-07 NOTE — Progress Notes (Signed)
   Subjective:    Patient ID: Nicholas Huffman, male    DOB: 1946-02-10, 70 y.o.   MRN: [id number]  HPI  Patient is here today for follow-up of his hypertension and hyperlipidemia. Unfortunately he continues to smoke. He denies any chest pain shortness of breath or dyspnea on exertion. He denies any myalgias or right upper quadrant pain on his Lipitor. He is due for Prevnar 13. He is unmotivated to quit smoking. He declines hepatitis C screening. He is due for a colonoscopy later this year. He is also due for prostate cancer screen. Past Medical History  Diagnosis Date  . Hyperlipidemia   . Hypertension   . Smoker    Past Surgical History  Procedure Laterality Date  . Knee arthroscopy  April/2013    left  . Colonoscopy  2008, 2002    hx polyps/Dr. Fuller Plan   Current Outpatient Prescriptions on File Prior to Visit  Medication Sig Dispense Refill  . amLODipine (NORVASC) 10 MG tablet TAKE 1 TABLET BY MOUTH EVERY DAY 90 tablet 3  . aspirin 81 MG tablet Take 81 mg by mouth daily.    Marland Kitchen atorvastatin (LIPITOR) 40 MG tablet Take 1 tablet (40 mg total) by mouth daily at 6 PM. Needs office visit and labs before further refills 90 tablet 0  . losartan-hydrochlorothiazide (HYZAAR) 100-25 MG per tablet TAKE 1 TABLET BY MOUTH ONCE DAILY 90 tablet 1   No current facility-administered medications on file prior to visit.   No Known Allergies Social History   Social History  . Marital Status: Married    Spouse Name: N/A  . Number of Children: N/A  . Years of Education: N/A   Occupational History  . Not on file.   Social History Main Topics  . Smoking status: Current Every Day Smoker -- 0.80 packs/day    Types: Cigarettes  . Smokeless tobacco: Never Used  . Alcohol Use: No  . Drug Use: No  . Sexual Activity: Not on file   Other Topics Concern  . Not on file   Social History Narrative      Review of Systems  All other systems reviewed and are negative.      Objective:   Physical  Exam  Cardiovascular: Normal rate, regular rhythm and normal heart sounds.   No murmur heard. Pulmonary/Chest: Effort normal and breath sounds normal. No respiratory distress. He has no wheezes. He has no rales.  Abdominal: Soft. Bowel sounds are normal. He exhibits no distension. There is no tenderness. There is no rebound.  Musculoskeletal: He exhibits no edema.  Vitals reviewed.         Assessment & Plan:  Benign essential HTN - Plan: CBC with Differential/Platelet, COMPLETE METABOLIC PANEL WITH GFR, Lipid panel  Prostate cancer screening - Plan: PSA  HLD (hyperlipidemia)  Smoker unmotivated to quit  Patient's blood pressure is well controlled. I will check a fasting lipid panel. Goal LDL cholesterol is less than 100. I strongly encouraged the patient to quit smoking. Patient received Prevnar 13 today.  I will check a PSA while checking blood work

## 2016-03-08 LAB — PSA: PSA: 3.51 ng/mL (ref <=4.00)

## 2016-08-06 ENCOUNTER — Other Ambulatory Visit: Payer: Self-pay

## 2016-09-06 DIAGNOSIS — Z23 Encounter for immunization: Secondary | ICD-10-CM | POA: Diagnosis not present

## 2016-09-23 ENCOUNTER — Other Ambulatory Visit: Payer: Self-pay | Admitting: Plastic Surgery

## 2016-09-23 DIAGNOSIS — D485 Neoplasm of uncertain behavior of skin: Secondary | ICD-10-CM | POA: Diagnosis not present

## 2016-09-23 DIAGNOSIS — C44319 Basal cell carcinoma of skin of other parts of face: Secondary | ICD-10-CM | POA: Diagnosis not present

## 2016-09-30 DIAGNOSIS — C4491 Basal cell carcinoma of skin, unspecified: Secondary | ICD-10-CM | POA: Insufficient documentation

## 2016-10-07 ENCOUNTER — Other Ambulatory Visit: Payer: Self-pay | Admitting: Plastic Surgery

## 2016-10-07 DIAGNOSIS — C4491 Basal cell carcinoma of skin, unspecified: Secondary | ICD-10-CM | POA: Diagnosis not present

## 2016-10-07 DIAGNOSIS — C44311 Basal cell carcinoma of skin of nose: Secondary | ICD-10-CM | POA: Diagnosis not present

## 2016-10-07 DIAGNOSIS — C44319 Basal cell carcinoma of skin of other parts of face: Secondary | ICD-10-CM | POA: Diagnosis not present

## 2016-10-09 DIAGNOSIS — H25811 Combined forms of age-related cataract, right eye: Secondary | ICD-10-CM | POA: Diagnosis not present

## 2016-10-09 DIAGNOSIS — Z483 Aftercare following surgery for neoplasm: Secondary | ICD-10-CM | POA: Diagnosis not present

## 2016-10-09 DIAGNOSIS — H52221 Regular astigmatism, right eye: Secondary | ICD-10-CM | POA: Diagnosis not present

## 2016-10-09 DIAGNOSIS — Z481 Encounter for planned postprocedural wound closure: Secondary | ICD-10-CM | POA: Diagnosis not present

## 2016-10-09 DIAGNOSIS — Z85828 Personal history of other malignant neoplasm of skin: Secondary | ICD-10-CM | POA: Diagnosis not present

## 2016-10-09 DIAGNOSIS — C4491 Basal cell carcinoma of skin, unspecified: Secondary | ICD-10-CM | POA: Diagnosis not present

## 2016-10-11 ENCOUNTER — Encounter: Payer: Self-pay | Admitting: Gastroenterology

## 2017-02-13 ENCOUNTER — Ambulatory Visit (INDEPENDENT_AMBULATORY_CARE_PROVIDER_SITE_OTHER): Payer: Medicare Other | Admitting: Family Medicine

## 2017-02-13 ENCOUNTER — Encounter: Payer: Self-pay | Admitting: Family Medicine

## 2017-02-13 ENCOUNTER — Encounter: Payer: Self-pay | Admitting: Gastroenterology

## 2017-02-13 VITALS — BP 118/64 | HR 68 | Temp 98.3°F | Resp 14 | Ht 67.0 in | Wt 182.0 lb

## 2017-02-13 DIAGNOSIS — Z125 Encounter for screening for malignant neoplasm of prostate: Secondary | ICD-10-CM | POA: Diagnosis not present

## 2017-02-13 DIAGNOSIS — E7800 Pure hypercholesterolemia, unspecified: Secondary | ICD-10-CM

## 2017-02-13 DIAGNOSIS — I10 Essential (primary) hypertension: Secondary | ICD-10-CM

## 2017-02-13 DIAGNOSIS — Z1211 Encounter for screening for malignant neoplasm of colon: Secondary | ICD-10-CM

## 2017-02-13 DIAGNOSIS — F17200 Nicotine dependence, unspecified, uncomplicated: Secondary | ICD-10-CM

## 2017-02-13 LAB — CBC WITH DIFFERENTIAL/PLATELET
BASOS ABS: 79 {cells}/uL (ref 0–200)
Basophils Relative: 1 %
EOS ABS: 237 {cells}/uL (ref 15–500)
Eosinophils Relative: 3 %
HCT: 51.3 % — ABNORMAL HIGH (ref 38.5–50.0)
Hemoglobin: 17.2 g/dL — ABNORMAL HIGH (ref 13.0–17.0)
LYMPHS PCT: 21 %
Lymphs Abs: 1659 cells/uL (ref 850–3900)
MCH: 30.8 pg (ref 27.0–33.0)
MCHC: 33.5 g/dL (ref 32.0–36.0)
MCV: 91.9 fL (ref 80.0–100.0)
MONOS PCT: 6 %
MPV: 9.6 fL (ref 7.5–12.5)
Monocytes Absolute: 474 cells/uL (ref 200–950)
Neutro Abs: 5451 cells/uL (ref 1500–7800)
Neutrophils Relative %: 69 %
PLATELETS: 268 10*3/uL (ref 140–400)
RBC: 5.58 MIL/uL (ref 4.20–5.80)
RDW: 13.9 % (ref 11.0–15.0)
WBC: 7.9 10*3/uL (ref 3.8–10.8)

## 2017-02-13 LAB — LIPID PANEL
CHOL/HDL RATIO: 2.6 ratio (ref <5.0)
Cholesterol: 102 mg/dL (ref <200)
HDL: 39 mg/dL — AB (ref >40)
LDL CALC: 39 mg/dL (ref <100)
TRIGLYCERIDES: 122 mg/dL (ref <150)
VLDL: 24 mg/dL (ref <30)

## 2017-02-13 LAB — COMPLETE METABOLIC PANEL WITH GFR
ALT: 20 U/L (ref 9–46)
AST: 17 U/L (ref 10–35)
Albumin: 4.4 g/dL (ref 3.6–5.1)
Alkaline Phosphatase: 62 U/L (ref 40–115)
BILIRUBIN TOTAL: 0.6 mg/dL (ref 0.2–1.2)
BUN: 22 mg/dL (ref 7–25)
CHLORIDE: 102 mmol/L (ref 98–110)
CO2: 23 mmol/L (ref 20–31)
Calcium: 9.6 mg/dL (ref 8.6–10.3)
Creat: 1.16 mg/dL (ref 0.70–1.18)
GFR, EST NON AFRICAN AMERICAN: 63 mL/min (ref >=60)
GFR, Est African American: 73 mL/min (ref >=60)
Glucose, Bld: 91 mg/dL (ref 70–99)
POTASSIUM: 4.3 mmol/L (ref 3.5–5.3)
SODIUM: 138 mmol/L (ref 135–146)
Total Protein: 7.1 g/dL (ref 6.1–8.1)

## 2017-02-13 LAB — PSA: PSA: 2.9 ng/mL (ref <=4.0)

## 2017-02-13 MED ORDER — AMLODIPINE BESYLATE 10 MG PO TABS: 10.0000 mg | ORAL_TABLET | Freq: Every day | ORAL | 3 refills | Status: DC

## 2017-02-13 MED ORDER — LOSARTAN POTASSIUM-HCTZ 100-25 MG PO TABS: 1.0000 | ORAL_TABLET | Freq: Every day | ORAL | 3 refills | Status: DC

## 2017-02-13 MED ORDER — ATORVASTATIN CALCIUM 40 MG PO TABS: 40.0000 mg | ORAL_TABLET | Freq: Every day | ORAL | 3 refills | Status: DC

## 2017-02-13 MED ORDER — VARENICLINE TARTRATE 0.5 MG X 11 & 1 MG X 42 PO MISC
ORAL | 0 refills | Status: DC
Start: 2017-02-13 — End: 2017-04-01

## 2017-02-13 NOTE — Progress Notes (Signed)
Subjective:    Patient ID: Nicholas Huffman, male    DOB: Oct 06, 1946, 71 y.o.   MRN: [id number]  HPI Patient is here today for follow-up of his hypertension and hyperlipidemia. Unfortunately he continues to smoke. He denies any chest pain shortness of breath or dyspnea on exertion. He denies any myalgias or right upper quadrant pain on his Lipitor. He has had Pneumovax 23. He has had Prevnar 13. He is due for colonoscopy. I will get this scheduled. He is also due for a PSA for cancer screening for his prostate. His blood pressure today is well controlled Past Medical History:  Diagnosis Date  . Hyperlipidemia   . Hypertension   . Smoker    Past Surgical History:  Procedure Laterality Date  . COLONOSCOPY  2008, 2002   hx polyps/Dr. Fuller Plan  . KNEE ARTHROSCOPY  April/2013   left   Current Outpatient Prescriptions on File Prior to Visit  Medication Sig Dispense Refill  . amLODipine (NORVASC) 10 MG tablet Take 1 tablet (10 mg total) by mouth daily. 90 tablet 3  . aspirin 81 MG tablet Take 81 mg by mouth daily.    Marland Kitchen atorvastatin (LIPITOR) 40 MG tablet Take 1 tablet (40 mg total) by mouth daily at 6 PM. 90 tablet 3  . losartan-hydrochlorothiazide (HYZAAR) 100-25 MG tablet Take 1 tablet by mouth daily. 90 tablet 3   No current facility-administered medications on file prior to visit.    No Known Allergies Social History   Social History  . Marital status: Married    Spouse name: N/A  . Number of children: N/A  . Years of education: N/A   Occupational History  . Not on file.   Social History Main Topics  . Smoking status: Current Every Day Smoker    Packs/day: 0.80    Types: Cigarettes  . Smokeless tobacco: Never Used  . Alcohol use No  . Drug use: No  . Sexual activity: Not on file   Other Topics Concern  . Not on file   Social History Narrative  . No narrative on file      Review of Systems  All other systems reviewed and are negative.      Objective:   Physical  Exam  Constitutional: He appears well-developed and well-nourished. No distress.  HENT:  Head: Normocephalic and atraumatic.  Right Ear: External ear normal.  Left Ear: External ear normal.  Nose: Nose normal.  Mouth/Throat: Oropharynx is clear and moist. No oropharyngeal exudate.  Eyes: Conjunctivae are normal.  Neck: Neck supple.  Cardiovascular: Normal rate, regular rhythm and normal heart sounds.   No murmur heard. Pulmonary/Chest: Effort normal and breath sounds normal. No respiratory distress. He has no wheezes. He has no rales.  Abdominal: Soft. Bowel sounds are normal. He exhibits no distension. There is no tenderness. There is no rebound.  Musculoskeletal: He exhibits no edema.  Lymphadenopathy:    He has no cervical adenopathy.  Skin: No rash noted. He is not diaphoretic. No erythema.  Vitals reviewed.         Assessment & Plan:  Benign essential HTN - Plan: CBC with Differential/Platelet, COMPLETE METABOLIC PANEL WITH GFR, Lipid panel  Prostate cancer screening - Plan: PSA  Pure hypercholesterolemia  Smoker unmotivated to quit  Colon cancer screening - Plan: Ambulatory referral to Gastroenterology  Patient's blood pressure is well controlled. I will check a fasting lipid panel. Goal LDL cholesterol is less than 100. I strongly encouraged the patient to quit  smoking. The patient is willing to try Chantix starter pack. I prescribed this for him. We discussed side effects and how to initiate quitting. We also discussed lung cancer screening. He will consider low-dose annual CT scans of the lungs but at the present time is not sure if he was to this. Immunizations are up-to-date. I will check a PSA. I will also schedule a colonoscopy.

## 2017-02-13 NOTE — Addendum Note (Signed)
Addended by: Shary Decamp B on: 02/13/2017 09:55 AM   Modules accepted: Orders

## 2017-03-10 ENCOUNTER — Other Ambulatory Visit: Payer: Self-pay | Admitting: Family Medicine

## 2017-03-19 ENCOUNTER — Ambulatory Visit: Payer: Medicare Other | Admitting: Physician Assistant

## 2017-03-21 ENCOUNTER — Ambulatory Visit (INDEPENDENT_AMBULATORY_CARE_PROVIDER_SITE_OTHER): Payer: Medicare Other | Admitting: Family Medicine

## 2017-03-21 ENCOUNTER — Encounter: Payer: Self-pay | Admitting: Family Medicine

## 2017-03-21 VITALS — BP 120/60 | HR 80 | Temp 98.4°F | Resp 16 | Ht 67.0 in | Wt 180.0 lb

## 2017-03-21 DIAGNOSIS — J0100 Acute maxillary sinusitis, unspecified: Secondary | ICD-10-CM

## 2017-03-21 MED ORDER — HYDROCODONE-HOMATROPINE 5-1.5 MG/5ML PO SYRP: 5.0000 mL | ORAL_SOLUTION | Freq: Three times a day (TID) | ORAL | 0 refills | Status: DC | PRN

## 2017-03-21 MED ORDER — AMOXICILLIN-POT CLAVULANATE 875-125 MG PO TABS: 1.0000 | ORAL_TABLET | Freq: Two times a day (BID) | ORAL | 0 refills | Status: DC

## 2017-03-21 MED ORDER — PREDNISONE 20 MG PO TABS: ORAL_TABLET | ORAL | 0 refills | Status: DC

## 2017-03-21 NOTE — Progress Notes (Signed)
Subjective:    Patient ID: Nicholas Huffman, male    DOB: [birth year], 71 y.o.   MRN: [id number]  HPI Symptoms began 1 week ago. Symptoms consist of a cough that is mainly worse at night when he lays down, constant postnasal drip, pressure behind his eyes, pressure in his maxillary sinuses, rhinorrhea. He denies any fever. He denies any sharp sinus pain. He does complain of sinus pressure. He denies any hemoptysis or pleurisy or shortness of breath. He denies any chest pain, orthopnea, or paroxysmal nocturnal dyspnea Past Medical History:  Diagnosis Date  . Hyperlipidemia   . Hypertension   . Smoker    Past Surgical History:  Procedure Laterality Date  . COLONOSCOPY  2008, 2002   hx polyps/Dr. Fuller Plan  . KNEE ARTHROSCOPY  April/2013   left   Current Outpatient Prescriptions on File Prior to Visit  Medication Sig Dispense Refill  . amLODipine (NORVASC) 10 MG tablet Take 1 tablet (10 mg total) by mouth daily. 90 tablet 3  . aspirin 81 MG tablet Take 81 mg by mouth daily.    Marland Kitchen atorvastatin (LIPITOR) 40 MG tablet Take 1 tablet (40 mg total) by mouth daily at 6 PM. 90 tablet 3  . losartan-hydrochlorothiazide (HYZAAR) 100-25 MG tablet Take 1 tablet by mouth daily. 90 tablet 3  . varenicline (CHANTIX STARTING MONTH PAK) 0.5 MG X 11 & 1 MG X 42 tablet Take one 0.5 mg tablet by mouth once daily for 3 days, then increase to one 0.5 mg tablet twice daily for 4 days, then increase to one 1 mg tablet twice daily. 53 tablet 0   No current facility-administered medications on file prior to visit.    No Known Allergies Social History   Social History  . Marital status: Married    Spouse name: N/A  . Number of children: N/A  . Years of education: N/A   Occupational History  . Not on file.   Social History Main Topics  . Smoking status: Current Every Day Smoker    Packs/day: 0.80    Types: Cigarettes  . Smokeless tobacco: Never Used  . Alcohol use No  . Drug use: No  . Sexual activity: Not  on file   Other Topics Concern  . Not on file   Social History Narrative  . No narrative on file      Review of Systems  All other systems reviewed and are negative.      Objective:   Physical Exam  Constitutional: He appears well-developed and well-nourished.  HENT:  Right Ear: External ear normal.  Left Ear: External ear normal.  Nose: Mucosal edema and rhinorrhea present. Right sinus exhibits maxillary sinus tenderness. Left sinus exhibits maxillary sinus tenderness.  Mouth/Throat: Oropharynx is clear and moist.  Cardiovascular: Normal rate, regular rhythm and normal heart sounds.   Pulmonary/Chest: Effort normal. He has decreased breath sounds. He has no wheezes.  Vitals reviewed.         Assessment & Plan:  Acute non-recurrent maxillary sinusitis - Plan: predniSONE (DELTASONE) 20 MG tablet, HYDROcodone-homatropine (HYCODAN) 5-1.5 MG/5ML syrup, amoxicillin-clavulanate (AUGMENTIN) 875-125 MG tablet  Believe the patient is having postnasal drip secondary to sinusitis brought on by allergies. Begin prednisone taper pack. Given the patient's decreased breath sounds and mild expiratory wheezing and sinus pressure. He can use Hycodan 1 teaspoon every 8 hours as needed for cough at night. Should he develop pain and fever in his sinuses, I want him to add Augmentin. He will  not get the antibiotic unless his sinus pressure worsens

## 2017-04-01 ENCOUNTER — Encounter: Payer: Self-pay | Admitting: Gastroenterology

## 2017-04-01 ENCOUNTER — Ambulatory Visit (AMBULATORY_SURGERY_CENTER): Payer: Self-pay | Admitting: *Deleted

## 2017-04-01 VITALS — Ht 66.0 in | Wt 180.4 lb

## 2017-04-01 DIAGNOSIS — Z86010 Personal history of colonic polyps: Secondary | ICD-10-CM

## 2017-04-01 MED ORDER — NA SULFATE-K SULFATE-MG SULF 17.5-3.13-1.6 GM/177ML PO SOLN: ORAL | 0 refills | Status: DC

## 2017-04-01 NOTE — Progress Notes (Signed)
Pt denies allergies to eggs or soy products. Denies difficulty with sedation or anesthesia. Denies any diet or weight loss medications. Denies use of supplemental oxygen.  Emmi instructions given for procedure.  

## 2017-04-15 ENCOUNTER — Encounter: Payer: Self-pay | Admitting: Gastroenterology

## 2017-04-15 ENCOUNTER — Ambulatory Visit (AMBULATORY_SURGERY_CENTER): Payer: Medicare Other | Admitting: Gastroenterology

## 2017-04-15 VITALS — BP 119/71 | HR 65 | Temp 98.0°F | Resp 17 | Ht 66.0 in | Wt 180.0 lb

## 2017-04-15 DIAGNOSIS — Z86010 Personal history of colonic polyps: Secondary | ICD-10-CM

## 2017-04-15 DIAGNOSIS — D123 Benign neoplasm of transverse colon: Secondary | ICD-10-CM

## 2017-04-15 DIAGNOSIS — I10 Essential (primary) hypertension: Secondary | ICD-10-CM | POA: Diagnosis not present

## 2017-04-15 DIAGNOSIS — D122 Benign neoplasm of ascending colon: Secondary | ICD-10-CM

## 2017-04-15 DIAGNOSIS — E669 Obesity, unspecified: Secondary | ICD-10-CM | POA: Diagnosis not present

## 2017-04-15 MED ORDER — SODIUM CHLORIDE 0.9 % IV SOLN: 500.0000 mL | INTRAVENOUS | Status: DC

## 2017-04-15 NOTE — Progress Notes (Signed)
Called to room to assist during endoscopic procedure.  Patient ID and intended procedure confirmed with present staff. Received instructions for my participation in the procedure from the performing physician.  

## 2017-04-15 NOTE — Op Note (Signed)
[facility] Patient Name: Nicholas Huffman Procedure Date: 04/15/2017 10:09 AM MRN: [id number] Endoscopist: Ladene Artist , MD Age: 71 Referring MD:  Date of Birth: 09-25-1946 Gender: Male Account #: [id number] Procedure:                Colonoscopy Indications:              Surveillance: Personal history of adenomatous                            polyps on last colonoscopy > 3 years ago Medicines:                Monitored Anesthesia Care Procedure:                Pre-Anesthesia Assessment:                           - Prior to the procedure, a History and Physical                            was performed, and patient medications and                            allergies were reviewed. The patient's tolerance of                            previous anesthesia was also reviewed. The risks                            and benefits of the procedure and the sedation                            options and risks were discussed with the patient.                            All questions were answered, and informed consent                            was obtained. Prior Anticoagulants: The patient has                            taken no previous anticoagulant or antiplatelet                            agents. ASA Grade Assessment: II - A patient with                            mild systemic disease. After reviewing the risks                            and benefits, the patient was deemed in                            satisfactory condition to undergo the procedure.  After obtaining informed consent, the colonoscope                            was passed under direct vision. Throughout the                            procedure, the patient's blood pressure, pulse, and                            oxygen saturations were monitored continuously. The                            Colonoscope was introduced through the anus and                            advanced to the the cecum,  identified by                            appendiceal orifice and ileocecal valve. The                            ileocecal valve, appendiceal orifice, and rectum                            were photographed. The quality of the bowel                            preparation was adequate after extensive lavage and                            suctioning. The colonoscopy was performed without                            difficulty. The patient tolerated the procedure                            well. Scope In: 10:18:14 AM Scope Out: 10:44:10 AM Scope Withdrawal Time: 0 hours 23 minutes 52 seconds  Total Procedure Duration: 0 hours 25 minutes 56 seconds  Findings:                 The perianal and digital rectal examinations were                            normal.                           A 30 mm polyp was found in the ascending colon. The                            polyp was sessile and was wrapping around a fold                            which made removal techincally challenging The  polyp was removed with a piecemeal technique using                            a hot snare. Resection appeared complete but may                            not have been. Retrieval were complete.                           Four sessile polyps were found in the transverse                            colon. The polyps were 8 to 16 mm in size. These                            polyps were removed with a hot snare. Resection and                            retrieval were complete.                           Normal mucosa was found in the ascending colon. 2                            areas were tattooed with injections of 2 mL of Spot                            (carbon black): 1 just proximal to the polyp site                            and 1 site directly across the lumen from the polyp                            site.                           Multiple small- and medium-sized localized                             angiodysplastic lesions without bleeding were found                            in the cecum.                           Many medium-mouthed diverticula were found in the                            left colon. There was no evidence of diverticular                            bleeding.  Anal papilla(e) were hypertrophied.                           The exam was otherwise without abnormality on                            direct and retroflexion views. Complications:            No immediate complications. Estimated blood loss:                            None. Estimated Blood Loss:     Estimated blood loss: none. Impression:               - One 30 mm polyp in the ascending colon, removed                            piecemeal using a hot snare. Resected and retrieved.                           - Four 8 to 16 mm polyps in the transverse colon,                            removed with a hot snare. Resected and retrieved.                           - Normal mucosa in the ascending colon. Tattooed.                           - Multiple non-bleeding colonic angiodysplastic                            lesions.                           - Mild diverticulosis in the left colon.                           - Anal papilla(e) were hypertrophied.                           - The examination was otherwise normal on direct                            and retroflexion views. Recommendation:           - Repeat colonoscopy in 6 months for surveillance                            after piecemeal polypectomy.                           - Patient has a contact number available for                            emergencies. The signs and symptoms of potential  delayed complications were discussed with the                            patient. Return to normal activities tomorrow.                            Written discharge instructions were provided to the                             patient.                           - Resume previous diet.                           - Continue present medications.                           - Await pathology results.                           - No aspirin, ibuprofen, naproxen, or other                            non-steroidal anti-inflammatory drugs for 3 weeks                            after polyp removal. Ladene Artist, MD 04/15/2017 10:56:23 AM This report has been signed electronically.

## 2017-04-15 NOTE — Patient Instructions (Signed)
YOU HAD AN ENDOSCOPIC PROCEDURE TODAY AT [facility]:   Refer to the procedure report that was given to you for any specific questions about what was found during the examination.  If the procedure report does not answer your questions, please call your gastroenterologist to clarify.  If you requested that your care partner not be given the details of your procedure findings, then the procedure report has been included in a sealed envelope for you to review at your convenience later.  YOU SHOULD EXPECT: Some feelings of bloating in the abdomen. Passage of more gas than usual.  Walking can help get rid of the air that was put into your GI tract during the procedure and reduce the bloating. If you had a lower endoscopy (such as a colonoscopy or flexible sigmoidoscopy) you may notice spotting of blood in your stool or on the toilet paper. If you underwent a bowel prep for your procedure, you may not have a normal bowel movement for a few days.  Please Note:  You might notice some irritation and congestion in your nose or some drainage.  This is from the oxygen used during your procedure.  There is no need for concern and it should clear up in a day or so.  SYMPTOMS TO REPORT IMMEDIATELY:   Following lower endoscopy (colonoscopy or flexible sigmoidoscopy):  Excessive amounts of blood in the stool  Significant tenderness or worsening of abdominal pains  Swelling of the abdomen that is new, acute  Fever of 100F or higher   For urgent or emergent issues, a gastroenterologist can be reached at any hour by calling [phone].   DIET:  We do recommend a small meal at first, but then you may proceed to your regular diet.  Drink plenty of fluids but you should avoid alcoholic beverages for 24 hours.  ACTIVITY:  You should plan to take it easy for the rest of today and you should NOT DRIVE or use heavy machinery until tomorrow (because of the sedation medicines used during the test).     FOLLOW UP: Our staff will call the number listed on your records the next business day following your procedure to check on you and address any questions or concerns that you may have regarding the information given to you following your procedure. If we do not reach you, we will leave a message.  However, if you are feeling well and you are not experiencing any problems, there is no need to return our call.  We will assume that you have returned to your regular daily activities without incident.  If any biopsies were taken you will be contacted by phone or by letter within the next 1-3 weeks.  Please call us at [phone] if you have not heard about the biopsies in 3 weeks.    SIGNATURES/CONFIDENTIALITY: You and/or your care partner have signed paperwork which will be entered into your electronic medical record.  These signatures attest to the fact that that the information above on your After Visit Summary has been reviewed and is understood.  Full responsibility of the confidentiality of this discharge information lies with you and/or your care-partner.  Recall colonoscopy 6 months.  No aspirin, ibuprofen, naproxen, or other non steroidal anti inflammatory meds for 3 weeks.  Diverticulosis and polyp information given.

## 2017-04-15 NOTE — Progress Notes (Signed)
Report to PACU, RN, vss, BBS= Clear.  

## 2017-04-16 ENCOUNTER — Telehealth: Payer: Self-pay | Admitting: *Deleted

## 2017-04-16 NOTE — Telephone Encounter (Signed)
  Follow up Call-  Call back number 04/15/2017  Post procedure Call Back phone  # [phone]  Permission to leave phone message Yes  Some recent data might be hidden     Patient questions:  Message left to call us if necessary.

## 2017-04-17 ENCOUNTER — Telehealth: Payer: Self-pay | Admitting: *Deleted

## 2017-04-17 NOTE — Telephone Encounter (Signed)
  Follow up Call-  Call back number 04/15/2017  Post procedure Call Back phone  # [phone]  Permission to leave phone message Yes  Some recent data might be hidden     Patient questions:  Do you have a fever, pain , or abdominal swelling? No. Pain Score  0 *  Have you tolerated food without any problems? Yes.    Have you been able to return to your normal activities? Yes.    Do you have any questions about your discharge instructions: Diet   No. Medications  No. Follow up visit  No.  Do you have questions or concerns about your Care? No.  Actions: * If pain score is 4 or above: No action needed, pain <4.

## 2017-04-21 ENCOUNTER — Encounter: Payer: Self-pay | Admitting: Gastroenterology

## 2017-08-14 DIAGNOSIS — H59811 Chorioretinal scars after surgery for detachment, right eye: Secondary | ICD-10-CM | POA: Diagnosis not present

## 2017-09-15 DIAGNOSIS — Z23 Encounter for immunization: Secondary | ICD-10-CM | POA: Diagnosis not present

## 2017-11-11 ENCOUNTER — Encounter: Payer: Self-pay | Admitting: Gastroenterology

## 2018-01-15 ENCOUNTER — Encounter: Payer: Self-pay | Admitting: Family Medicine

## 2018-01-15 ENCOUNTER — Ambulatory Visit (INDEPENDENT_AMBULATORY_CARE_PROVIDER_SITE_OTHER): Payer: Medicare Other | Admitting: Family Medicine

## 2018-01-15 VITALS — BP 130/70 | HR 78 | Temp 98.2°F | Resp 18 | Ht 67.0 in | Wt 179.0 lb

## 2018-01-15 DIAGNOSIS — M25561 Pain in right knee: Secondary | ICD-10-CM | POA: Diagnosis not present

## 2018-01-15 DIAGNOSIS — G8929 Other chronic pain: Secondary | ICD-10-CM | POA: Diagnosis not present

## 2018-01-15 DIAGNOSIS — M25562 Pain in left knee: Secondary | ICD-10-CM | POA: Diagnosis not present

## 2018-01-15 DIAGNOSIS — I10 Essential (primary) hypertension: Secondary | ICD-10-CM | POA: Diagnosis not present

## 2018-01-15 LAB — CBC WITH DIFFERENTIAL/PLATELET
BASOS PCT: 1.3 %
Basophils Absolute: 109 cells/uL (ref 0–200)
EOS ABS: 252 {cells}/uL (ref 15–500)
Eosinophils Relative: 3 %
HEMATOCRIT: 47.6 % (ref 38.5–50.0)
Hemoglobin: 16.3 g/dL (ref 13.2–17.1)
LYMPHS ABS: 1747 {cells}/uL (ref 850–3900)
MCH: 30.1 pg (ref 27.0–33.0)
MCHC: 34.2 g/dL (ref 32.0–36.0)
MCV: 87.8 fL (ref 80.0–100.0)
MPV: 9.7 fL (ref 7.5–12.5)
Monocytes Relative: 6.3 %
Neutro Abs: 5762 cells/uL (ref 1500–7800)
Neutrophils Relative %: 68.6 %
Platelets: 351 10*3/uL (ref 140–400)
RBC: 5.42 10*6/uL (ref 4.20–5.80)
RDW: 13 % (ref 11.0–15.0)
Total Lymphocyte: 20.8 %
WBC: 8.4 10*3/uL (ref 3.8–10.8)
WBCMIX: 529 {cells}/uL (ref 200–950)

## 2018-01-15 LAB — COMPLETE METABOLIC PANEL WITH GFR
AG RATIO: 1.8 (calc) (ref 1.0–2.5)
ALBUMIN MSPROF: 4.4 g/dL (ref 3.6–5.1)
ALT: 27 U/L (ref 9–46)
AST: 20 U/L (ref 10–35)
Alkaline phosphatase (APISO): 63 U/L (ref 40–115)
BUN: 22 mg/dL (ref 7–25)
CALCIUM: 10 mg/dL (ref 8.6–10.3)
CO2: 25 mmol/L (ref 20–32)
Chloride: 102 mmol/L (ref 98–110)
Creat: 1.17 mg/dL (ref 0.70–1.18)
GFR, EST AFRICAN AMERICAN: 72 mL/min/{1.73_m2} (ref >=60)
GFR, EST NON AFRICAN AMERICAN: 62 mL/min/{1.73_m2} (ref >=60)
GLOBULIN: 2.5 g/dL (ref 1.9–3.7)
Glucose, Bld: 103 mg/dL — ABNORMAL HIGH (ref 65–99)
POTASSIUM: 3.8 mmol/L (ref 3.5–5.3)
SODIUM: 137 mmol/L (ref 135–146)
TOTAL PROTEIN: 6.9 g/dL (ref 6.1–8.1)
Total Bilirubin: 1 mg/dL (ref 0.2–1.2)

## 2018-01-15 LAB — LIPID PANEL
Cholesterol: 112 mg/dL (ref <200)
HDL: 43 mg/dL (ref >40)
LDL Cholesterol (Calc): 49 mg/dL (calc)
Non-HDL Cholesterol (Calc): 69 mg/dL (calc) (ref <130)
TRIGLYCERIDES: 116 mg/dL (ref <150)
Total CHOL/HDL Ratio: 2.6 (calc) (ref <5.0)

## 2018-01-15 MED ORDER — MELOXICAM 15 MG PO TABS: 15.0000 mg | ORAL_TABLET | Freq: Every day | ORAL | 0 refills | Status: DC

## 2018-01-15 NOTE — Progress Notes (Signed)
Subjective:    Patient ID: Nicholas Huffman, male    DOB: 09-11-1946, 72 y.o.   MRN: [id number]  Medication Refill    Patient is here today for follow-up of his hypertension and hyperlipidemia. Unfortunately he continues to smoke. He denies any chest pain shortness of breath or dyspnea on exertion. He denies any myalgias or right upper quadrant pain on his Lipitor.  Patient is here today complaining of left knee pain.  Has a history of a meniscal tear and left knee arthroscopy approximately 2 years ago.  Ever since that time, he has had pain in both knees right as well as left.  Pain is centered around both joint lines.  It hurts to stand and walk.  He is not taking any arthritis medication.  He has not tried any cortisone injections.  He has not tried Visco supplementation.  He denies any erythema or effusion.  He denies any falls or injury. Past Medical History:  Diagnosis Date  . Cancer ([place])    basal cell  . Cataract   . Hyperlipidemia   . Hypertension   . Smoker    Past Surgical History:  Procedure Laterality Date  . BASAL CELL CARCINOMA EXCISION  2016   face  . COLONOSCOPY  2008, 2002   hx polyps/Dr. Fuller Plan  . KNEE ARTHROSCOPY  April/2013   left   Current Outpatient Medications on File Prior to Visit  Medication Sig Dispense Refill  . amLODipine (NORVASC) 10 MG tablet Take 1 tablet (10 mg total) by mouth daily. 90 tablet 3  . aspirin 81 MG tablet Take 81 mg by mouth daily.    Marland Kitchen atorvastatin (LIPITOR) 40 MG tablet Take 1 tablet (40 mg total) by mouth daily at 6 PM. 90 tablet 3  . losartan-hydrochlorothiazide (HYZAAR) 100-25 MG tablet Take 1 tablet by mouth daily. 90 tablet 3   Current Facility-Administered Medications on File Prior to Visit  Medication Dose Route Frequency Provider Last Rate Last Dose  . 0.9 %  sodium chloride infusion  500 mL Intravenous Continuous Ladene Artist, MD       No Known Allergies Social History   Socioeconomic History  . Marital status:  Married    Spouse name: Not on file  . Number of children: Not on file  . Years of education: Not on file  . Highest education level: Not on file  Social Needs  . Financial resource strain: Not on file  . Food insecurity - worry: Not on file  . Food insecurity - inability: Not on file  . Transportation needs - medical: Not on file  . Transportation needs - non-medical: Not on file  Occupational History  . Not on file  Tobacco Use  . Smoking status: Current Every Day Smoker    Packs/day: 0.80    Types: Cigarettes  . Smokeless tobacco: Never Used  Substance and Sexual Activity  . Alcohol use: No  . Drug use: No  . Sexual activity: Not on file  Other Topics Concern  . Not on file  Social History Narrative  . Not on file      Review of Systems  All other systems reviewed and are negative.      Objective:   Physical Exam  Constitutional: He appears well-developed and well-nourished. No distress.  HENT:  Head: Normocephalic and atraumatic.  Right Ear: External ear normal.  Left Ear: External ear normal.  Nose: Nose normal.  Mouth/Throat: Oropharynx is clear and moist. No oropharyngeal exudate.  Eyes: Conjunctivae are normal.  Neck: Neck supple.  Cardiovascular: Normal rate, regular rhythm and normal heart sounds.  No murmur heard. Pulmonary/Chest: Effort normal and breath sounds normal. No respiratory distress. He has no wheezes. He has no rales.  Abdominal: Soft. Bowel sounds are normal. He exhibits no distension. There is no tenderness. There is no rebound.  Musculoskeletal: He exhibits no edema.  Lymphadenopathy:    He has no cervical adenopathy.  Skin: No rash noted. He is not diaphoretic. No erythema.  Vitals reviewed.         Assessment & Plan:  Benign essential HTN - Plan: CBC with Differential/Platelet, COMPLETE METABOLIC PANEL WITH GFR, Lipid panel  Chronic pain of both knees   Patient's blood pressure is well controlled. I will check a fasting  lipid panel. Goal LDL cholesterol is less than 100. I strongly encouraged the patient to quit smoking.  Recommended try meloxicam 15 mg a day for the next 2-3 weeks to see if knee pain will improve on daily NSAID.  Reassess in 2-3 weeks.  If no better will try repeat cortisone injection.  If no benefit is seen from that, will refer back to orthopedics for possible Visco supplementation

## 2018-01-20 ENCOUNTER — Encounter: Payer: Self-pay | Admitting: Family Medicine

## 2018-02-06 ENCOUNTER — Ambulatory Visit (INDEPENDENT_AMBULATORY_CARE_PROVIDER_SITE_OTHER): Payer: Medicare Other | Admitting: Family Medicine

## 2018-02-06 ENCOUNTER — Encounter: Payer: Self-pay | Admitting: Family Medicine

## 2018-02-06 VITALS — BP 132/60 | HR 62 | Temp 98.5°F | Resp 16 | Wt 181.0 lb

## 2018-02-06 DIAGNOSIS — M25561 Pain in right knee: Secondary | ICD-10-CM

## 2018-02-06 DIAGNOSIS — G8929 Other chronic pain: Secondary | ICD-10-CM

## 2018-02-06 NOTE — Progress Notes (Signed)
Subjective:    Patient ID: Nicholas Huffman, male    DOB: 1946/07/27, 72 y.o.   MRN: [id number]  HPI  Patient presents for follow-up of his right knee pain.  He is seen no benefit since starting meloxicam.  His lab work was excellent aside from a mild elevated fasting sugar of 103.  His blood pressure today is well controlled at 132/60.  We discussed options for managing his knee pain including a referral to orthopedic surgery, Visco supplementation, cortisone injections.  Patient elects to start with a basic cortisone injection today in the right knee for pain relief.  If pain does not improve at that point, he would then request a referral to orthopedics to discuss his next option. Past Medical History:  Diagnosis Date  . Cancer ([place])    basal cell  . Cataract   . Hyperlipidemia   . Hypertension   . Smoker    Past Surgical History:  Procedure Laterality Date  . BASAL CELL CARCINOMA EXCISION  2016   face  . COLONOSCOPY  2008, 2002   hx polyps/Dr. Fuller Plan  . KNEE ARTHROSCOPY  April/2013   left   Current Outpatient Medications on File Prior to Visit  Medication Sig Dispense Refill  . amLODipine (NORVASC) 10 MG tablet Take 1 tablet (10 mg total) by mouth daily. 90 tablet 3  . aspirin 81 MG tablet Take 81 mg by mouth daily.    Marland Kitchen atorvastatin (LIPITOR) 40 MG tablet Take 1 tablet (40 mg total) by mouth daily at 6 PM. 90 tablet 3  . losartan-hydrochlorothiazide (HYZAAR) 100-25 MG tablet Take 1 tablet by mouth daily. 90 tablet 3  . meloxicam (MOBIC) 15 MG tablet Take 1 tablet (15 mg total) by mouth daily. 30 tablet 0   Current Facility-Administered Medications on File Prior to Visit  Medication Dose Route Frequency Provider Last Rate Last Dose  . 0.9 %  sodium chloride infusion  500 mL Intravenous Continuous Ladene Artist, MD       No Known Allergies Social History   Socioeconomic History  . Marital status: Married    Spouse name: Not on file  . Number of children: Not on file  .  Years of education: Not on file  . Highest education level: Not on file  Social Needs  . Financial resource strain: Not on file  . Food insecurity - worry: Not on file  . Food insecurity - inability: Not on file  . Transportation needs - medical: Not on file  . Transportation needs - non-medical: Not on file  Occupational History  . Not on file  Tobacco Use  . Smoking status: Current Every Day Smoker    Packs/day: 0.80    Types: Cigarettes  . Smokeless tobacco: Never Used  Substance and Sexual Activity  . Alcohol use: No  . Drug use: No  . Sexual activity: Not on file  Other Topics Concern  . Not on file  Social History Narrative  . Not on file     Review of Systems  All other systems reviewed and are negative.      Objective:   Physical Exam  Cardiovascular: Normal rate, regular rhythm and normal heart sounds.  Pulmonary/Chest: Effort normal and breath sounds normal. No respiratory distress. He has no wheezes.  Musculoskeletal:       Right knee: He exhibits decreased range of motion. Tenderness found. Medial joint line and lateral joint line tenderness noted.  Vitals reviewed.  Assessment & Plan:  Chronic pain of right knee - Plan: DG Knee Complete 4 Views Right  I requested the patient get a basic x-ray of the right knee to ensure that all we are dealing with his osteoarthritis and to rule out underlying skeletal pathology.  Using sterile technique, I injected the right knee with a mixture of 2 cc of lidocaine, 2 cc of Marcaine, and 2 cc of 40 mg/mL Kenalog.  The patient tolerated the procedure well without complication.

## 2018-02-09 ENCOUNTER — Ambulatory Visit
Admission: RE | Admit: 2018-02-09 | Discharge: 2018-02-09 | Disposition: A | Discharge status: Home / Self Care | Payer: Medicare Other | Source: Ambulatory Visit | Attending: Family Medicine | Admitting: Family Medicine

## 2018-02-09 DIAGNOSIS — M1711 Unilateral primary osteoarthritis, right knee: Secondary | ICD-10-CM | POA: Diagnosis not present

## 2018-02-09 DIAGNOSIS — G8929 Other chronic pain: Secondary | ICD-10-CM

## 2018-02-09 DIAGNOSIS — M25561 Pain in right knee: Principal | ICD-10-CM

## 2018-02-11 ENCOUNTER — Other Ambulatory Visit: Payer: Self-pay | Admitting: Family Medicine

## 2018-02-11 DIAGNOSIS — G8929 Other chronic pain: Secondary | ICD-10-CM

## 2018-02-11 DIAGNOSIS — M25561 Pain in right knee: Principal | ICD-10-CM

## 2018-02-11 DIAGNOSIS — M1711 Unilateral primary osteoarthritis, right knee: Secondary | ICD-10-CM

## 2018-02-17 ENCOUNTER — Encounter (INDEPENDENT_AMBULATORY_CARE_PROVIDER_SITE_OTHER): Payer: Self-pay | Admitting: Orthopaedic Surgery

## 2018-02-17 ENCOUNTER — Ambulatory Visit (INDEPENDENT_AMBULATORY_CARE_PROVIDER_SITE_OTHER): Payer: Medicare Other | Admitting: Orthopaedic Surgery

## 2018-02-17 DIAGNOSIS — M1711 Unilateral primary osteoarthritis, right knee: Secondary | ICD-10-CM | POA: Diagnosis not present

## 2018-02-17 NOTE — Progress Notes (Signed)
Office Visit Note   Patient: Nicholas Huffman           Date of Birth: 08-30-1946           MRN: [id number] Visit Date: 02/17/2018              Requested by: Susy Frizzle, MD [address] [place], [place] [address] PCP: Susy Frizzle, MD   Assessment & Plan: Visit Diagnoses:  1. Primary localized osteoarthritis of right knee     Plan: Impression is 72 year old gentleman with end-stage degenerative joint disease right knee.  At this point he has failed conservative treatment.  He has significant difficulty with ADLs and chronic night pain.  He wishes to pursue a right total knee replacement after a thorough discussion of risk benefits alternatives to surgery.  He did recently receive a cortisone injection on March 1 so the soonest he can have his surgery is 6 weeks from then.  He recently saw his PCP and he had a normal physical.  We will get preoperative medical clearance from his PCP prior to surgery.  Patient denies history of DVT.  Follow-Up Instructions: Return for 2 week postop visit.   Orders:  No orders of the defined types were placed in this encounter.  No orders of the defined types were placed in this encounter.     Procedures: No procedures performed   Clinical Data: No additional findings.   Subjective: Chief Complaint  Patient presents with  . Right Knee - Pain    HPI Nicholas Huffman is a pleasant 72 year old new patient who presents to our clinic today with right knee pain.  This is been ongoing since 2013.  He had a right knee arthroscopic debridement medial meniscus tear.  He states that this helps very temporarily, but soon after his pain returned.  His pain is steadily worsened over the past few years.  He described this as a global toothache worse with any movement of the knee.  He does note increased weakness and instability but no mechanical symptoms.  Approximately 3 weeks ago he was seen by his primary care provider where he had an intra-articular  cortisone injection.  He notes that he had no relief from this.  Previous Visco supplementation injections.  At this point, he is ready for definitive treatment of a total knee replacement.  Review of Systems as detailed next Urology and are negative.   Objective: Vital Signs: There were no vitals taken for this visit.  Physical Exam well-developed well-nourished gentleman no acute distress.  Alert and oriented x3.  Ortho Exam examination of the right knee reveals a trace effusion.  Range of motion from 2 to 115 degrees.  Minimal patellofemoral crepitus.  Medial joint line tenderness.  He is stable to valgus and varus stress.  He is neurovascular intact distally.  Specialty Comments:  No specialty comments available.  Imaging: Right knee images reviewed by me in canopy reveal bone-on-bone medial compartments with decreased space patellofemoral compartment.   PMFS History: Patient Active Problem List   Diagnosis Date Noted  . Primary localized osteoarthritis of right knee 02/17/2018  . Hyperlipidemia   . Hypertension   . Smoker    Past Medical History:  Diagnosis Date  . Cancer ([place])    basal cell  . Cataract   . Hyperlipidemia   . Hypertension   . Smoker     Family History  Problem Relation Age of Onset  . Breast cancer Mother   .  Colon cancer Neg Hx     Past Surgical History:  Procedure Laterality Date  . BASAL CELL CARCINOMA EXCISION  2016   face  . COLONOSCOPY  2008, 2002   hx polyps/Dr. Fuller Plan  . KNEE ARTHROSCOPY  April/2013   left   Social History   Occupational History  . Not on file  Tobacco Use  . Smoking status: Current Every Day Smoker    Packs/day: 0.80    Types: Cigarettes  . Smokeless tobacco: Never Used  Substance and Sexual Activity  . Alcohol use: No  . Drug use: No  . Sexual activity: Not on file

## 2018-03-03 ENCOUNTER — Telehealth (INDEPENDENT_AMBULATORY_CARE_PROVIDER_SITE_OTHER): Payer: Self-pay | Admitting: Orthopaedic Surgery

## 2018-03-03 NOTE — Telephone Encounter (Signed)
Patient came into the clinic wanting to get his surgery with Dr. Erlinda Hong scheduled.  CB#[phone].  Thank you.

## 2018-03-06 ENCOUNTER — Other Ambulatory Visit: Payer: Self-pay | Admitting: Family Medicine

## 2018-03-17 ENCOUNTER — Other Ambulatory Visit: Payer: Self-pay

## 2018-03-17 ENCOUNTER — Encounter (HOSPITAL_COMMUNITY)
Admission: RE | Admit: 2018-03-17 | Discharge: 2018-03-17 | Disposition: A | Discharge status: Home / Self Care | Payer: Medicare Other | Source: Ambulatory Visit | Attending: Orthopaedic Surgery | Admitting: Orthopaedic Surgery

## 2018-03-17 ENCOUNTER — Encounter (HOSPITAL_COMMUNITY): Payer: Self-pay

## 2018-03-17 DIAGNOSIS — Z0183 Encounter for blood typing: Secondary | ICD-10-CM | POA: Diagnosis not present

## 2018-03-17 DIAGNOSIS — Z01812 Encounter for preprocedural laboratory examination: Secondary | ICD-10-CM | POA: Diagnosis not present

## 2018-03-17 DIAGNOSIS — Z01818 Encounter for other preprocedural examination: Secondary | ICD-10-CM | POA: Diagnosis not present

## 2018-03-17 DIAGNOSIS — M1711 Unilateral primary osteoarthritis, right knee: Secondary | ICD-10-CM | POA: Diagnosis not present

## 2018-03-17 HISTORY — DX: Unilateral primary osteoarthritis, right knee: M17.11

## 2018-03-17 HISTORY — DX: Unspecified fracture of skull, initial encounter for closed fracture: S02.91XA

## 2018-03-17 HISTORY — DX: Presence of spectacles and contact lenses: Z97.3

## 2018-03-17 HISTORY — DX: Presence of external hearing-aid: Z97.4

## 2018-03-17 LAB — CBC WITH DIFFERENTIAL/PLATELET
Basophils Absolute: 0.1 10*3/uL (ref 0.0–0.1)
Basophils Relative: 1 %
EOS ABS: 0.2 10*3/uL (ref 0.0–0.7)
Eosinophils Relative: 2 %
HCT: 48.4 % (ref 39.0–52.0)
HEMOGLOBIN: 16.3 g/dL (ref 13.0–17.0)
LYMPHS ABS: 1.6 10*3/uL (ref 0.7–4.0)
LYMPHS PCT: 18 %
MCH: 31.3 pg (ref 26.0–34.0)
MCHC: 33.7 g/dL (ref 30.0–36.0)
MCV: 92.9 fL (ref 78.0–100.0)
Monocytes Absolute: 0.5 10*3/uL (ref 0.1–1.0)
Monocytes Relative: 6 %
NEUTROS ABS: 6.3 10*3/uL (ref 1.7–7.7)
NEUTROS PCT: 73 %
Platelets: 277 10*3/uL (ref 150–400)
RBC: 5.21 MIL/uL (ref 4.22–5.81)
RDW: 14.6 % (ref 11.5–15.5)
WBC: 8.5 10*3/uL (ref 4.0–10.5)

## 2018-03-17 LAB — COMPREHENSIVE METABOLIC PANEL
ALT: 22 U/L (ref 17–63)
AST: 22 U/L (ref 15–41)
Albumin: 4.1 g/dL (ref 3.5–5.0)
Alkaline Phosphatase: 67 U/L (ref 38–126)
Anion gap: 9 (ref 5–15)
BUN: 26 mg/dL — ABNORMAL HIGH (ref 6–20)
CO2: 24 mmol/L (ref 22–32)
CREATININE: 1.17 mg/dL (ref 0.61–1.24)
Calcium: 9.5 mg/dL (ref 8.9–10.3)
Chloride: 102 mmol/L (ref 101–111)
GFR calc non Af Amer: >60 mL/min (ref >60)
Glucose, Bld: 149 mg/dL — ABNORMAL HIGH (ref 65–99)
Potassium: 3.6 mmol/L (ref 3.5–5.1)
SODIUM: 135 mmol/L (ref 135–145)
Total Bilirubin: 0.5 mg/dL (ref 0.3–1.2)
Total Protein: 6.6 g/dL (ref 6.5–8.1)

## 2018-03-17 LAB — TYPE AND SCREEN
ABO/RH(D): O POS
Antibody Screen: NEGATIVE

## 2018-03-17 LAB — SURGICAL PCR SCREEN
MRSA, PCR: NEGATIVE
Staphylococcus aureus: NEGATIVE

## 2018-03-17 LAB — PROTIME-INR
INR: 0.95
Prothrombin Time: 12.6 seconds (ref 11.4–15.2)

## 2018-03-17 LAB — APTT: aPTT: 29 seconds (ref 24–36)

## 2018-03-17 LAB — ABO/RH: ABO/RH(D): O POS

## 2018-03-17 NOTE — Progress Notes (Signed)
Pt denies SOB, chest pain, and being under the care of a cardiologist. Pt denies having a stress test, echo and cardiac cath. Pt denies having an EKG and chest x ray within the last year. Pt denies recent labs. Pt made aware to stop taking Aspirin 5 days prior to surgery (per [facility], Surgical Coordinator). Pt verbalized understanding of all pre-op instructions.

## 2018-03-17 NOTE — Pre-Procedure Instructions (Signed)
    AKSHAR STARNES  03/17/2018      [id number] - [place], [place] [address] [place] [address] Phone: [phone] Fax: [phone]    Your procedure is scheduled on Thursday, March 26, 2018  Report to [facility] Admitting at 10:15 A.M.  Call this number if you have problems the morning of surgery:  [phone]   Remember: Follow doctors instructions regarding Aspirin  Do not eat food or drink liquids after midnight Wednesday, March 25, 2018  Take these medicines the morning of surgery with A SIP OF WATER : amLODipine (NORVASC)  Stop taking vitamins, fish oil and herbal medications. Do not take any NSAIDs ie: Ibuprofen, Advil, Naproxen (Aleve), Motrin, BC and Goody Powder; stop Thursday, March 19, 2018.  Do not wear jewelry, make-up or nail polish.  Do not wear lotions, powders, or perfumes, or deodorant.  Do not shave 48 hours prior to surgery.  Men may shave face and neck.  Do not bring valuables to the hospital.  [facility] is not responsible for any belongings or valuables.  Contacts, dentures or bridgework may not be worn into surgery.  Leave your suitcase in the car.  After surgery it may be brought to your room. For patients admitted to the hospital, discharge time will be determined by your treatment team. Special instructions: See " [facility] Preparing For Surgery " sheet Please read over the following fact sheets that you were given. Pain Booklet, Coughing and Deep Breathing, Total Joint Packet and MRSA Information

## 2018-03-18 ENCOUNTER — Other Ambulatory Visit: Payer: Self-pay | Admitting: Family Medicine

## 2018-03-23 ENCOUNTER — Other Ambulatory Visit (INDEPENDENT_AMBULATORY_CARE_PROVIDER_SITE_OTHER): Payer: Self-pay

## 2018-03-25 MED ORDER — TRANEXAMIC ACID 1000 MG/10ML IV SOLN
2000.0000 mg | INTRAVENOUS | Status: AC
  Administered 2018-03-26: 2000 mg via TOPICAL
  Filled 2018-03-25: qty 20

## 2018-03-25 MED ORDER — TRANEXAMIC ACID 1000 MG/10ML IV SOLN
1000.0000 mg | INTRAVENOUS | Status: AC
  Administered 2018-03-26: 1000 mg via INTRAVENOUS
  Filled 2018-03-25: qty 10

## 2018-03-26 ENCOUNTER — Ambulatory Visit (HOSPITAL_COMMUNITY): Payer: Medicare Other | Admitting: Anesthesiology

## 2018-03-26 ENCOUNTER — Encounter (HOSPITAL_COMMUNITY): Payer: Self-pay | Admitting: Anesthesiology

## 2018-03-26 ENCOUNTER — Encounter (HOSPITAL_COMMUNITY)
Admission: RE | Disposition: A | Discharge status: Home Health | Payer: Self-pay | Source: Ambulatory Visit | Attending: Orthopaedic Surgery

## 2018-03-26 ENCOUNTER — Inpatient Hospital Stay (HOSPITAL_COMMUNITY)
Admission: RE | Admit: 2018-03-26 | Discharge: 2018-03-27 | DRG: 470 | Disposition: A | Discharge status: Home Health | Payer: Medicare Other | Source: Ambulatory Visit | Attending: Orthopaedic Surgery | Admitting: Orthopaedic Surgery

## 2018-03-26 ENCOUNTER — Other Ambulatory Visit: Payer: Self-pay

## 2018-03-26 DIAGNOSIS — F17210 Nicotine dependence, cigarettes, uncomplicated: Secondary | ICD-10-CM | POA: Diagnosis present

## 2018-03-26 DIAGNOSIS — Z6828 Body mass index (BMI) 28.0-28.9, adult: Secondary | ICD-10-CM

## 2018-03-26 DIAGNOSIS — I10 Essential (primary) hypertension: Secondary | ICD-10-CM | POA: Diagnosis present

## 2018-03-26 DIAGNOSIS — G8918 Other acute postprocedural pain: Secondary | ICD-10-CM | POA: Diagnosis not present

## 2018-03-26 DIAGNOSIS — E785 Hyperlipidemia, unspecified: Secondary | ICD-10-CM | POA: Diagnosis present

## 2018-03-26 DIAGNOSIS — Z79899 Other long term (current) drug therapy: Secondary | ICD-10-CM | POA: Diagnosis not present

## 2018-03-26 DIAGNOSIS — E669 Obesity, unspecified: Secondary | ICD-10-CM | POA: Diagnosis present

## 2018-03-26 DIAGNOSIS — Z96651 Presence of right artificial knee joint: Secondary | ICD-10-CM

## 2018-03-26 DIAGNOSIS — Z7982 Long term (current) use of aspirin: Secondary | ICD-10-CM | POA: Diagnosis not present

## 2018-03-26 DIAGNOSIS — M1711 Unilateral primary osteoarthritis, right knee: Principal | ICD-10-CM | POA: Diagnosis present

## 2018-03-26 DIAGNOSIS — D62 Acute posthemorrhagic anemia: Secondary | ICD-10-CM | POA: Diagnosis not present

## 2018-03-26 HISTORY — PX: TOTAL KNEE ARTHROPLASTY: SHX125

## 2018-03-26 SURGERY — ARTHROPLASTY, KNEE, TOTAL
Anesthesia: Monitor Anesthesia Care | Site: Knee | Laterality: Right

## 2018-03-26 MED ORDER — LOSARTAN POTASSIUM-HCTZ 100-25 MG PO TABS: 1.0000 | ORAL_TABLET | Freq: Every day | ORAL | Status: DC

## 2018-03-26 MED ORDER — HYDROMORPHONE HCL 2 MG/ML IJ SOLN: 0.5000 mg | INTRAMUSCULAR | Status: DC | PRN

## 2018-03-26 MED ORDER — PROPOFOL 500 MG/50ML IV EMUL
INTRAVENOUS | Status: DC | PRN
  Administered 2018-03-26: 50 ug/kg/min via INTRAVENOUS
  Administered 2018-03-26: 75 ug/kg/min via INTRAVENOUS

## 2018-03-26 MED ORDER — POLYETHYLENE GLYCOL 3350 17 G PO PACK: 17.0000 g | PACK | Freq: Every day | ORAL | Status: DC | PRN

## 2018-03-26 MED ORDER — ASPIRIN EC 81 MG PO TBEC: 81.0000 mg | DELAYED_RELEASE_TABLET | Freq: Two times a day (BID) | ORAL | 0 refills | Status: DC

## 2018-03-26 MED ORDER — ONDANSETRON HCL 4 MG PO TABS: 4.0000 mg | ORAL_TABLET | Freq: Three times a day (TID) | ORAL | 0 refills | Status: DC | PRN

## 2018-03-26 MED ORDER — MIDAZOLAM HCL 2 MG/2ML IJ SOLN
INTRAMUSCULAR | Status: AC
  Filled 2018-03-26: qty 2

## 2018-03-26 MED ORDER — ACETAMINOPHEN 500 MG PO TABS
ORAL_TABLET | ORAL | Status: AC
  Administered 2018-03-26: 1000 mg via ORAL
  Filled 2018-03-26: qty 2

## 2018-03-26 MED ORDER — DEXAMETHASONE SODIUM PHOSPHATE 10 MG/ML IJ SOLN
10.0000 mg | Freq: Once | INTRAMUSCULAR | Status: AC
  Administered 2018-03-27: 10 mg via INTRAVENOUS
  Filled 2018-03-26: qty 1

## 2018-03-26 MED ORDER — FENTANYL CITRATE (PF) 250 MCG/5ML IJ SOLN
INTRAMUSCULAR | Status: AC
  Filled 2018-03-26: qty 5

## 2018-03-26 MED ORDER — FENTANYL CITRATE (PF) 100 MCG/2ML IJ SOLN
INTRAMUSCULAR | Status: AC
  Filled 2018-03-26: qty 2

## 2018-03-26 MED ORDER — METHOCARBAMOL 750 MG PO TABS: 750.0000 mg | ORAL_TABLET | Freq: Two times a day (BID) | ORAL | 0 refills | Status: DC | PRN

## 2018-03-26 MED ORDER — VANCOMYCIN HCL 1 G IV SOLR
INTRAVENOUS | Status: DC | PRN
  Administered 2018-03-26: 1000 mg via TOPICAL

## 2018-03-26 MED ORDER — SODIUM CHLORIDE 0.9 % IV SOLN
INTRAVENOUS | Status: DC
  Administered 2018-03-26: 19:00:00 via INTRAVENOUS

## 2018-03-26 MED ORDER — DEXMEDETOMIDINE HCL 200 MCG/2ML IV SOLN
INTRAVENOUS | Status: DC | PRN
  Administered 2018-03-26: 28 ug via INTRAVENOUS
  Administered 2018-03-26: 12 ug via INTRAVENOUS
  Administered 2018-03-26: 8 ug via INTRAVENOUS
  Administered 2018-03-26: 32 ug via INTRAVENOUS

## 2018-03-26 MED ORDER — MEPERIDINE HCL 50 MG/ML IJ SOLN: 6.2500 mg | INTRAMUSCULAR | Status: DC | PRN

## 2018-03-26 MED ORDER — SODIUM CHLORIDE 0.9% FLUSH
INTRAVENOUS | Status: DC | PRN
  Administered 2018-03-26: 40 mL

## 2018-03-26 MED ORDER — ASPIRIN 81 MG PO CHEW
81.0000 mg | CHEWABLE_TABLET | Freq: Two times a day (BID) | ORAL | Status: DC
  Administered 2018-03-27: 81 mg via ORAL
  Filled 2018-03-26: qty 1

## 2018-03-26 MED ORDER — SORBITOL 70 % SOLN: 30.0000 mL | Freq: Every day | Status: DC | PRN

## 2018-03-26 MED ORDER — AMLODIPINE BESYLATE 10 MG PO TABS
10.0000 mg | ORAL_TABLET | Freq: Every day | ORAL | Status: DC
  Administered 2018-03-27: 10 mg via ORAL
  Filled 2018-03-26: qty 1

## 2018-03-26 MED ORDER — BUPIVACAINE LIPOSOME 1.3 % IJ SUSP
20.0000 mL | Freq: Once | INTRAMUSCULAR | Status: DC
  Filled 2018-03-26: qty 20

## 2018-03-26 MED ORDER — METOCLOPRAMIDE HCL 5 MG/ML IJ SOLN: 5.0000 mg | Freq: Three times a day (TID) | INTRAMUSCULAR | Status: DC | PRN

## 2018-03-26 MED ORDER — HYDROCHLOROTHIAZIDE 25 MG PO TABS
25.0000 mg | ORAL_TABLET | Freq: Every day | ORAL | Status: DC
  Filled 2018-03-26 (×2): qty 1

## 2018-03-26 MED ORDER — ALUM & MAG HYDROXIDE-SIMETH 200-200-20 MG/5ML PO SUSP: 30.0000 mL | ORAL | Status: DC | PRN

## 2018-03-26 MED ORDER — DIPHENHYDRAMINE HCL 12.5 MG/5ML PO ELIX: 25.0000 mg | ORAL_SOLUTION | ORAL | Status: DC | PRN

## 2018-03-26 MED ORDER — OXYCODONE HCL 5 MG PO TABS: 5.0000 mg | ORAL_TABLET | ORAL | Status: DC | PRN

## 2018-03-26 MED ORDER — CHLORHEXIDINE GLUCONATE 4 % EX LIQD: 60.0000 mL | Freq: Once | CUTANEOUS | Status: DC

## 2018-03-26 MED ORDER — DEXAMETHASONE SODIUM PHOSPHATE 10 MG/ML IJ SOLN
INTRAMUSCULAR | Status: AC
  Filled 2018-03-26: qty 3

## 2018-03-26 MED ORDER — SENNOSIDES-DOCUSATE SODIUM 8.6-50 MG PO TABS: 1.0000 | ORAL_TABLET | Freq: Every evening | ORAL | 1 refills | Status: DC | PRN

## 2018-03-26 MED ORDER — METOCLOPRAMIDE HCL 5 MG PO TABS: 5.0000 mg | ORAL_TABLET | Freq: Three times a day (TID) | ORAL | Status: DC | PRN

## 2018-03-26 MED ORDER — ONDANSETRON HCL 4 MG PO TABS: 4.0000 mg | ORAL_TABLET | Freq: Four times a day (QID) | ORAL | Status: DC | PRN

## 2018-03-26 MED ORDER — SODIUM CHLORIDE 0.9 % IR SOLN
Status: DC | PRN
  Administered 2018-03-26: 3000 mL

## 2018-03-26 MED ORDER — CELECOXIB 200 MG PO CAPS
ORAL_CAPSULE | ORAL | Status: AC
  Filled 2018-03-26: qty 1

## 2018-03-26 MED ORDER — ROPIVACAINE HCL 7.5 MG/ML IJ SOLN
INTRAMUSCULAR | Status: DC | PRN
  Administered 2018-03-26: 25 mL via PERINEURAL

## 2018-03-26 MED ORDER — OXYCODONE HCL 5 MG PO TABS: 5.0000 mg | ORAL_TABLET | ORAL | 0 refills | Status: DC | PRN

## 2018-03-26 MED ORDER — LIDOCAINE HCL (CARDIAC) PF 100 MG/5ML IV SOSY
PREFILLED_SYRINGE | INTRAVENOUS | Status: DC | PRN
  Administered 2018-03-26: 20 mg via INTRATRACHEAL

## 2018-03-26 MED ORDER — ACETAMINOPHEN 500 MG PO TABS
1000.0000 mg | ORAL_TABLET | Freq: Four times a day (QID) | ORAL | Status: DC
  Administered 2018-03-26 – 2018-03-27 (×3): 1000 mg via ORAL
  Filled 2018-03-26 (×3): qty 2

## 2018-03-26 MED ORDER — BUPIVACAINE IN DEXTROSE 0.75-8.25 % IT SOLN
INTRATHECAL | Status: DC | PRN
  Administered 2018-03-26: 12 mg via INTRATHECAL

## 2018-03-26 MED ORDER — 0.9 % SODIUM CHLORIDE (POUR BTL) OPTIME
TOPICAL | Status: DC | PRN
  Administered 2018-03-26: 1000 mL

## 2018-03-26 MED ORDER — OXYCODONE HCL ER 10 MG PO T12A
10.0000 mg | EXTENDED_RELEASE_TABLET | Freq: Two times a day (BID) | ORAL | Status: DC
  Administered 2018-03-27 (×2): 10 mg via ORAL
  Filled 2018-03-26 (×2): qty 1

## 2018-03-26 MED ORDER — ACETAMINOPHEN 325 MG PO TABS: 325.0000 mg | ORAL_TABLET | Freq: Four times a day (QID) | ORAL | Status: DC | PRN

## 2018-03-26 MED ORDER — OXYCODONE HCL 5 MG PO TABS: 10.0000 mg | ORAL_TABLET | ORAL | Status: DC | PRN

## 2018-03-26 MED ORDER — DOCUSATE SODIUM 100 MG PO CAPS
100.0000 mg | ORAL_CAPSULE | Freq: Two times a day (BID) | ORAL | Status: DC
  Administered 2018-03-27: 100 mg via ORAL
  Filled 2018-03-26: qty 1

## 2018-03-26 MED ORDER — ONDANSETRON HCL 4 MG/2ML IJ SOLN
INTRAMUSCULAR | Status: AC
  Filled 2018-03-26: qty 6

## 2018-03-26 MED ORDER — LACTATED RINGERS IV SOLN
INTRAVENOUS | Status: DC | PRN
  Administered 2018-03-26 (×2): via INTRAVENOUS

## 2018-03-26 MED ORDER — FENTANYL CITRATE (PF) 100 MCG/2ML IJ SOLN
INTRAMUSCULAR | Status: AC
  Administered 2018-03-26: 50 ug via INTRAVENOUS
  Filled 2018-03-26: qty 2

## 2018-03-26 MED ORDER — MENTHOL 3 MG MT LOZG: 1.0000 | LOZENGE | OROMUCOSAL | Status: DC | PRN

## 2018-03-26 MED ORDER — FENTANYL CITRATE (PF) 100 MCG/2ML IJ SOLN
INTRAMUSCULAR | Status: DC | PRN
  Administered 2018-03-26: 50 ug via INTRAVENOUS

## 2018-03-26 MED ORDER — METHOCARBAMOL 1000 MG/10ML IJ SOLN
500.0000 mg | Freq: Four times a day (QID) | INTRAMUSCULAR | Status: DC | PRN
  Filled 2018-03-26: qty 5

## 2018-03-26 MED ORDER — PHENOL 1.4 % MT LIQD: 1.0000 | OROMUCOSAL | Status: DC | PRN

## 2018-03-26 MED ORDER — DEXMEDETOMIDINE HCL IN NACL 200 MCG/50ML IV SOLN
INTRAVENOUS | Status: AC
  Filled 2018-03-26: qty 50

## 2018-03-26 MED ORDER — OXYCODONE HCL ER 10 MG PO T12A: 10.0000 mg | EXTENDED_RELEASE_TABLET | Freq: Two times a day (BID) | ORAL | 0 refills | Status: AC

## 2018-03-26 MED ORDER — CEFAZOLIN SODIUM-DEXTROSE 2-4 GM/100ML-% IV SOLN
2.0000 g | Freq: Four times a day (QID) | INTRAVENOUS | Status: AC
  Administered 2018-03-27 (×2): 2 g via INTRAVENOUS
  Filled 2018-03-26 (×3): qty 100

## 2018-03-26 MED ORDER — GABAPENTIN 300 MG PO CAPS
300.0000 mg | ORAL_CAPSULE | Freq: Three times a day (TID) | ORAL | Status: DC
  Administered 2018-03-27 (×2): 300 mg via ORAL
  Filled 2018-03-26 (×2): qty 1

## 2018-03-26 MED ORDER — LIDOCAINE 2% (20 MG/ML) 5 ML SYRINGE
INTRAMUSCULAR | Status: AC
  Filled 2018-03-26: qty 15

## 2018-03-26 MED ORDER — HYDROCHLOROTHIAZIDE 25 MG PO TABS: 25.0000 mg | ORAL_TABLET | Freq: Every day | ORAL | Status: DC

## 2018-03-26 MED ORDER — LOSARTAN POTASSIUM 50 MG PO TABS
100.0000 mg | ORAL_TABLET | Freq: Every day | ORAL | Status: DC
  Administered 2018-03-27: 100 mg via ORAL
  Filled 2018-03-26 (×2): qty 2

## 2018-03-26 MED ORDER — PROMETHAZINE HCL 25 MG PO TABS: 25.0000 mg | ORAL_TABLET | Freq: Four times a day (QID) | ORAL | 1 refills | Status: DC | PRN

## 2018-03-26 MED ORDER — PHENYLEPHRINE HCL 10 MG/ML IJ SOLN
INTRAVENOUS | Status: DC | PRN
  Administered 2018-03-26: 40 ug/min via INTRAVENOUS

## 2018-03-26 MED ORDER — MIDAZOLAM HCL 5 MG/5ML IJ SOLN
INTRAMUSCULAR | Status: DC | PRN
  Administered 2018-03-26: 2 mg via INTRAVENOUS

## 2018-03-26 MED ORDER — CEFAZOLIN SODIUM-DEXTROSE 2-4 GM/100ML-% IV SOLN
2.0000 g | INTRAVENOUS | Status: AC
  Administered 2018-03-26 (×2): 2 g via INTRAVENOUS
  Filled 2018-03-26: qty 100

## 2018-03-26 MED ORDER — TRANEXAMIC ACID 1000 MG/10ML IV SOLN
1000.0000 mg | INTRAVENOUS | Status: AC
  Filled 2018-03-26: qty 10

## 2018-03-26 MED ORDER — BUPIVACAINE LIPOSOME 1.3 % IJ SUSP
INTRAMUSCULAR | Status: DC | PRN
  Administered 2018-03-26: 20 mL

## 2018-03-26 MED ORDER — ONDANSETRON HCL 4 MG/2ML IJ SOLN: 4.0000 mg | Freq: Four times a day (QID) | INTRAMUSCULAR | Status: DC | PRN

## 2018-03-26 MED ORDER — ONDANSETRON HCL 4 MG/2ML IJ SOLN
INTRAMUSCULAR | Status: DC | PRN
  Administered 2018-03-26: 4 mg via INTRAVENOUS

## 2018-03-26 MED ORDER — TRANEXAMIC ACID 1000 MG/10ML IV SOLN
1000.0000 mg | Freq: Once | INTRAVENOUS | Status: AC
  Administered 2018-03-26: 1000 mg via INTRAVENOUS
  Filled 2018-03-26: qty 10

## 2018-03-26 MED ORDER — METHOCARBAMOL 500 MG PO TABS
500.0000 mg | ORAL_TABLET | Freq: Four times a day (QID) | ORAL | Status: DC | PRN
  Administered 2018-03-26 – 2018-03-27 (×2): 500 mg via ORAL
  Filled 2018-03-26 (×2): qty 1

## 2018-03-26 MED ORDER — ATORVASTATIN CALCIUM 40 MG PO TABS
40.0000 mg | ORAL_TABLET | Freq: Every day | ORAL | Status: DC
  Administered 2018-03-26: 40 mg via ORAL
  Filled 2018-03-26: qty 1

## 2018-03-26 MED ORDER — PROPOFOL 10 MG/ML IV BOLUS
INTRAVENOUS | Status: AC
  Filled 2018-03-26: qty 40

## 2018-03-26 MED ORDER — LACTATED RINGERS IV SOLN
INTRAVENOUS | Status: DC
  Administered 2018-03-26: 10:00:00 via INTRAVENOUS

## 2018-03-26 MED ORDER — SUGAMMADEX SODIUM 200 MG/2ML IV SOLN
INTRAVENOUS | Status: AC
  Filled 2018-03-26: qty 2

## 2018-03-26 MED ORDER — CELECOXIB 200 MG PO CAPS
200.0000 mg | ORAL_CAPSULE | Freq: Two times a day (BID) | ORAL | Status: DC
  Administered 2018-03-26 – 2018-03-27 (×2): 200 mg via ORAL
  Filled 2018-03-26: qty 1

## 2018-03-26 MED ORDER — ROCURONIUM BROMIDE 10 MG/ML (PF) SYRINGE
PREFILLED_SYRINGE | INTRAVENOUS | Status: AC
  Filled 2018-03-26: qty 10

## 2018-03-26 MED ORDER — MAGNESIUM CITRATE PO SOLN: 1.0000 | Freq: Once | ORAL | Status: DC | PRN

## 2018-03-26 MED ORDER — KETOROLAC TROMETHAMINE 15 MG/ML IJ SOLN
30.0000 mg | Freq: Four times a day (QID) | INTRAMUSCULAR | Status: DC
  Administered 2018-03-26 – 2018-03-27 (×3): 30 mg via INTRAVENOUS
  Filled 2018-03-26 (×3): qty 2

## 2018-03-26 MED ORDER — FENTANYL CITRATE (PF) 100 MCG/2ML IJ SOLN
25.0000 ug | INTRAMUSCULAR | Status: DC | PRN
  Administered 2018-03-26 (×2): 50 ug via INTRAVENOUS

## 2018-03-26 SURGICAL SUPPLY — 81 items
ALCOHOL ISOPROPYL (RUBBING) (MISCELLANEOUS) ×3 IMPLANT
APL SKNCLS STERI-STRIP NONHPOA (GAUZE/BANDAGES/DRESSINGS) ×1
BAG DECANTER FOR FLEXI CONT (MISCELLANEOUS) ×3 IMPLANT
BANDAGE ACE 6X5 VEL STRL LF (GAUZE/BANDAGES/DRESSINGS) ×3 IMPLANT
BANDAGE ESMARK 6X9 LF (GAUZE/BANDAGES/DRESSINGS) ×1 IMPLANT
BASEPLATE TIBIAL SZ 6 (Knees) ×1 IMPLANT
BENZOIN TINCTURE PRP APPL 2/3 (GAUZE/BANDAGES/DRESSINGS) ×3 IMPLANT
BLADE SAW SGTL 13.0X1.19X90.0M (BLADE) ×3 IMPLANT
BNDG CMPR 9X6 STRL LF SNTH (GAUZE/BANDAGES/DRESSINGS) ×1
BNDG CMPR MED 10X6 ELC LF (GAUZE/BANDAGES/DRESSINGS) ×1
BNDG ELASTIC 6X10 VLCR STRL LF (GAUZE/BANDAGES/DRESSINGS) ×3 IMPLANT
BNDG ESMARK 6X9 LF (GAUZE/BANDAGES/DRESSINGS) ×3
BONE CEMENT PALACOS R-G (Cement) ×6 IMPLANT
BOWL SMART MIX CTS (DISPOSABLE) ×3 IMPLANT
CEMENT BONE PALACOS R-G (Cement) ×2 IMPLANT
CLOSURE STERI-STRIP 1/2X4 (GAUZE/BANDAGES/DRESSINGS) ×2
CLSR STERI-STRIP ANTIMIC 1/2X4 (GAUZE/BANDAGES/DRESSINGS) ×4 IMPLANT
COVER SURGICAL LIGHT HANDLE (MISCELLANEOUS) ×3 IMPLANT
CUFF TOURNIQUET SINGLE 34IN LL (TOURNIQUET CUFF) ×3 IMPLANT
CUFF TOURNIQUET SINGLE 44IN (TOURNIQUET CUFF) IMPLANT
DRAPE EXTREMITY T 121X128X90 (DRAPE) ×3 IMPLANT
DRAPE HALF SHEET 40X57 (DRAPES) ×3 IMPLANT
DRAPE INCISE IOBAN 66X45 STRL (DRAPES) IMPLANT
DRAPE ORTHO SPLIT 77X108 STRL (DRAPES) ×4
DRAPE POUCH INSTRU U-SHP 10X18 (DRAPES) ×3 IMPLANT
DRAPE SURG 17X11 SM STRL (DRAPES) ×6 IMPLANT
DRAPE SURG ORHT 6 SPLT 77X108 (DRAPES) ×2 IMPLANT
DRSG AQUACEL AG ADV 3.5X10 (GAUZE/BANDAGES/DRESSINGS) ×3 IMPLANT
DRSG AQUACEL AG ADV 3.5X14 (GAUZE/BANDAGES/DRESSINGS) ×3 IMPLANT
DURAPREP 26ML APPLICATOR (WOUND CARE) ×6 IMPLANT
ELECT CAUTERY BLADE 6.4 (BLADE) ×3 IMPLANT
ELECT REM PT RETURN 9FT ADLT (ELECTROSURGICAL) ×3
ELECTRODE REM PT RTRN 9FT ADLT (ELECTROSURGICAL) ×1 IMPLANT
FEM COMP OXINIUM RT KNEE SZ 6 (Hips) ×3 IMPLANT
FEMORAL COMP OXINIUM RT KN SZ6 (Hips) ×1 IMPLANT
GAUZE SPONGE 4X4 12PLY STRL (GAUZE/BANDAGES/DRESSINGS) ×3 IMPLANT
GAUZE XEROFORM 1X8 LF (GAUZE/BANDAGES/DRESSINGS) ×3 IMPLANT
GLOVE BIOGEL PI IND STRL 7.0 (GLOVE) ×1 IMPLANT
GLOVE BIOGEL PI INDICATOR 7.0 (GLOVE) ×2
GLOVE ECLIPSE 7.0 STRL STRAW (GLOVE) ×3 IMPLANT
GLOVE SKINSENSE NS SZ7.5 (GLOVE) ×2
GLOVE SKINSENSE STRL SZ7.5 (GLOVE) ×1 IMPLANT
GLOVE SURG SYN 7.5  E (GLOVE) ×8
GLOVE SURG SYN 7.5 E (GLOVE) ×4 IMPLANT
GOWN STRL REIN XL XLG (GOWN DISPOSABLE) ×3 IMPLANT
GOWN STRL REUS W/ TWL LRG LVL3 (GOWN DISPOSABLE) ×4 IMPLANT
GOWN STRL REUS W/TWL LRG LVL3 (GOWN DISPOSABLE) ×12
HANDPIECE INTERPULSE COAX TIP (DISPOSABLE) ×3
HOOD PEEL AWAY FLYTE STAYCOOL (MISCELLANEOUS) ×6 IMPLANT
INSERT SZ 5-6 KNEE (Knees) ×3 IMPLANT
KIT BASIN OR (CUSTOM PROCEDURE TRAY) ×3 IMPLANT
KIT TURNOVER KIT B (KITS) ×3 IMPLANT
MANIFOLD NEPTUNE II (INSTRUMENTS) ×3 IMPLANT
MARKER SKIN DUAL TIP RULER LAB (MISCELLANEOUS) ×3 IMPLANT
NEEDLE SPNL 18GX3.5 QUINCKE PK (NEEDLE) ×3 IMPLANT
NS IRRIG 1000ML POUR BTL (IV SOLUTION) ×3 IMPLANT
PACK TOTAL JOINT (CUSTOM PROCEDURE TRAY) ×3 IMPLANT
PAD ABD 8X10 STRL (GAUZE/BANDAGES/DRESSINGS) ×3 IMPLANT
PAD ARMBOARD 7.5X6 YLW CONV (MISCELLANEOUS) ×6 IMPLANT
PADDING CAST COTTON 6X4 STRL (CAST SUPPLIES) ×3 IMPLANT
PATELLA RESURF 35MM (Knees) ×3 IMPLANT
SAW OSC TIP CART 19.5X105X1.3 (SAW) ×3 IMPLANT
SET HNDPC FAN SPRY TIP SCT (DISPOSABLE) ×1 IMPLANT
STAPLER VISISTAT 35W (STAPLE) IMPLANT
SUCTION FRAZIER HANDLE 10FR (MISCELLANEOUS) ×2
SUCTION TUBE FRAZIER 10FR DISP (MISCELLANEOUS) ×1 IMPLANT
SUT ETHILON 2 0 FS 18 (SUTURE) IMPLANT
SUT MNCRL AB 4-0 PS2 18 (SUTURE) IMPLANT
SUT VIC AB 0 CT1 27 (SUTURE) ×6
SUT VIC AB 0 CT1 27XBRD ANBCTR (SUTURE) ×2 IMPLANT
SUT VIC AB 1 CTX 27 (SUTURE) ×9 IMPLANT
SUT VIC AB 2-0 CT1 27 (SUTURE) ×9
SUT VIC AB 2-0 CT1 TAPERPNT 27 (SUTURE) ×3 IMPLANT
SYR 50ML LL SCALE MARK (SYRINGE) ×6 IMPLANT
TIBIAL BASEPLATE SZ 6 (Knees) ×3 IMPLANT
TOWEL OR 17X24 6PK STRL BLUE (TOWEL DISPOSABLE) ×3 IMPLANT
TOWEL OR 17X26 10 PK STRL BLUE (TOWEL DISPOSABLE) ×3 IMPLANT
TRAY CATH 16FR W/PLASTIC CATH (SET/KITS/TRAYS/PACK) IMPLANT
UNDERPAD 30X30 (UNDERPADS AND DIAPERS) ×3 IMPLANT
WRAP KNEE MAXI GEL POST OP (GAUZE/BANDAGES/DRESSINGS) ×3 IMPLANT
YANKAUER SUCT BULB TIP NO VENT (SUCTIONS) ×3 IMPLANT

## 2018-03-26 NOTE — Anesthesia Procedure Notes (Addendum)
Anesthesia Regional Block: Adductor canal block   Pre-Anesthetic Checklist: ,, timeout performed, Correct Patient, Correct Site, Correct Laterality, Correct Procedure, Correct Position, site marked, Risks and benefits discussed,  Surgical consent,  Pre-op evaluation,  At surgeon's request and post-op pain management  Laterality: Right  Prep: chloraprep       Needles:  Injection technique: Single-shot  Needle Type: Echogenic Stimulator Needle     Needle Length: 5cm  Needle Gauge: 22     Additional Needles:   Procedures:, nerve stimulator,,, ultrasound used (permanent image in chart),,,,  Narrative:  Start time: 03/26/2018 11:52 AM End time: 03/26/2018 11:55 AM Injection made incrementally with aspirations every 5 mL.  Performed by: Personally   Additional Notes: Functioning IV was confirmed and monitors were applied.  A 18mm 22ga Arrow echogenic stimulator needle was used. Sterile prep and drape,hand hygiene and sterile gloves were used. Ultrasound guidance: relevant anatomy identified, needle position confirmed, local anesthetic spread visualized around nerve(s)., vascular puncture avoided.  Image printed for medical record. Negative aspiration and negative test dose prior to incremental administration of local anesthetic. The patient tolerated the procedure well.

## 2018-03-26 NOTE — Anesthesia Postprocedure Evaluation (Signed)
Anesthesia Post Note  Patient: Nicholas Huffman  Procedure(s) Performed: RIGHT TOTAL KNEE ARTHROPLASTY (Right Knee)     Patient location during evaluation: PACU Anesthesia Type: Regional Level of consciousness: awake and alert Pain management: pain level controlled Vital Signs Assessment: post-procedure vital signs reviewed and stable Respiratory status: spontaneous breathing, nonlabored ventilation, respiratory function stable and patient connected to nasal cannula oxygen Cardiovascular status: blood pressure returned to baseline and stable Postop Assessment: no apparent nausea or vomiting Anesthetic complications: no    Last Vitals:  Vitals:   03/26/18 1500 03/26/18 1515  BP: (!) 100/57 (!) 99/56  Pulse: (!) 49 (!) 49  Resp: (!) 21 (!) 21  Temp:    SpO2: 99% 96%    Last Pain:  Vitals:   03/26/18 1520  PainSc: 5                  Beuford Garcilazo

## 2018-03-26 NOTE — Progress Notes (Signed)
Orthopedic Tech Progress Note Patient Details:  Nicholas Huffman 01-24-1946 [id number]  CPM Right Knee CPM Right Knee: On Right Knee Flexion (Degrees): 90 Right Knee Extension (Degrees): 0  Post Interventions Patient Tolerated: Well Instructions Provided: Care of device  Braulio Bosch 03/26/2018, 3:21 PM

## 2018-03-26 NOTE — Discharge Instructions (Signed)

## 2018-03-26 NOTE — Transfer of Care (Signed)
Immediate Anesthesia Transfer of Care Note  Patient: Nicholas Huffman  Procedure(s) Performed: RIGHT TOTAL KNEE ARTHROPLASTY (Right Knee)  Patient Location: PACU  Anesthesia Type:MAC, Regional and Spinal  Level of Consciousness: sedated, drowsy and patient cooperative  Airway & Oxygen Therapy: Patient Spontanous Breathing and Patient connected to face mask oxygen  Post-op Assessment: Report given to RN and Post -op Vital signs reviewed and stable  Post vital signs: Reviewed and stable  Last Vitals:  Vitals Value Taken Time  BP 105/64 03/26/2018  2:45 PM  Temp    Pulse 50 03/26/2018  2:46 PM  Resp 26 03/26/2018  2:46 PM  SpO2 100 % 03/26/2018  2:46 PM  Vitals shown include unvalidated device data.  Last Pain:  Vitals:   03/26/18 1021  PainSc: 0-No pain         Complications: No apparent anesthesia complications

## 2018-03-26 NOTE — Anesthesia Procedure Notes (Signed)
Procedure Name: MAC Date/Time: 03/26/2018 12:28 PM Performed by: Neldon Newport, CRNA Pre-anesthesia Checklist: Timeout performed, Patient being monitored, Suction available, Emergency Drugs available and Patient identified Patient Re-evaluated:Patient Re-evaluated prior to induction Oxygen Delivery Method: Simple face mask Placement Confirmation: positive ETCO2

## 2018-03-26 NOTE — Op Note (Addendum)
Total Knee Arthroplasty Procedure Note  Preoperative diagnosis: Right knee osteoarthritis  Postoperative diagnosis:same  Operative procedure: Right total knee arthroplasty. CPT [phone]  Surgeon: N. Eduard Roux, MD  Assist: Laure Kidney, RNFA  Anesthesia: Spinal, regional  Tourniquet time: 60 mins  Implants used: Smith and Nephew Femur: PS 6 Tibia: 6 Patella: 35 mm Polyethylene: 11 mm  Indication: Nicholas Huffman is a 72 y.o. year old male with a history of knee pain. Having failed conservative management, the patient elected to proceed with a total knee arthroplasty.  We have reviewed the risk and benefits of the surgery and they elected to proceed after voicing understanding.  Procedure:  After informed consent was obtained and understanding of the risk were voiced including but not limited to bleeding, infection, damage to surrounding structures including nerves and vessels, blood clots, leg length inequality and the failure to achieve desired results, the operative extremity was marked with verbal confirmation of the patient in the holding area.   The patient was then brought to the operating room and transported to the operating room table in the supine position.  A tourniquet was applied to the operative extremity around the upper thigh. The operative limb was then prepped and draped in the usual sterile fashion and preoperative antibiotics were administered.  A time out was performed prior to the start of surgery confirming the correct extremity, preoperative antibiotic administration, as well as team members, implants and instruments available for the case. Correct surgical site was also confirmed with preoperative radiographs. The limb was then elevated for exsanguination and the tourniquet was inflated. A midline incision was made and a standard medial parapatellar approach was performed.  The patella was prepared and sized to a 35 mm.  A cover was placed on the patella for  protection from retractors.  We then turned our attention to the femur. Posterior cruciate ligament was sacrificed. Start site was drilled in the femur and the intramedullary distal femoral cutting guide was placed, set at 5 degrees valgus, taking 9 mm of distal resection. The distal cut was made. Osteophytes were then removed. Next, the proximal tibial cutting guide was placed with appropriate slope, varus/valgus alignment and depth of resection. The proximal tibial cut was made. Gap blocks were then used to assess the extension gap and alignment, and appropriate soft tissue releases were performed. Attention was turned back to the femur, which was sized using the sizing guide to a size 6. Appropriate rotation of the femoral component was determined using epicondylar axis, Whiteside's line, and assessing the flexion gap under ligament tension. The appropriate size 4-in-1 cutting block was placed and cuts were made. Posterior femoral osteophytes and uncapped bone were then removed with the curved osteotome. The tibia was sized for a size 6 component. The femoral box-cutting guide was placed and prepared for a PS femoral component. Trial components were placed, and stability was checked in full extension, mid-flexion, and deep flexion. Proper tibial rotation was determined and marked.  The patella tracked well without a lateral release. Trial components were then removed and tibial preparation performed. A posterior capsular injection comprising of 20 cc of 1.3% exparel and 40 cc of normal saline was performed for postoperative pain control. The bony surfaces were irrigated with a pulse lavage and then dried. Bone cement was vacuum mixed on the back table, and the final components sized above were cemented into place. After cement had finished curing, excess cement was removed. The stability of the construct was re-evaluated throughout  a range of motion and found to be acceptable. The trial liner was removed, the  knee was copiously irrigated, and the knee was re-evaluated for any excess bone debris. The real polyethylene liner, 11 mm thick, was inserted and checked to ensure the locking mechanism had engaged appropriately. The tourniquet was deflated and hemostasis was achieved. The wound was irrigated with normal saline.  One gram of vancomycin powder was placed in the surgical bed. A drain was not placed. Capsular closure was performed with a #1 vicryl, subcutaneous fat closed with a 0 vicryl suture, then subcutaneous tissue closed with interrupted 2.0 vicryl suture. The skin was then closed with a 3.0 monocryl. A sterile dressing was applied.  The patient was awakened in the operating room and taken to recovery in stable condition. All sponge, needle, and instrument counts were correct at the end of the case.  Position: supine  Complications: none.  Time Out: performed   Drains/Packing: none  Estimated blood loss: minimal  Returned to Recovery Room: in good condition.   Antibiotics: yes   Mechanical VTE (DVT) Prophylaxis: sequential compression devices, TED thigh-high  Chemical VTE (DVT) Prophylaxis: aspirin  Fluid Replacement  Crystalloid: see anesthesia record Blood: none  FFP: none   Specimens Removed: 1 to pathology   Sponge and Instrument Count Correct? yes   PACU: portable radiograph - knee AP and Lateral   Admission: inpatient status  Plan/RTC: Return in 2 weeks for wound check.   Weight Bearing/Load Lower Extremity: full   N. Eduard Roux, MD Long 2:02 PM

## 2018-03-26 NOTE — Anesthesia Preprocedure Evaluation (Signed)
Anesthesia Evaluation  Patient identified by MRN, date of birth, ID band Patient awake    Reviewed: Allergy & Precautions, H&P , NPO status , Patient's Chart, lab work & pertinent test results, reviewed documented beta blocker date and time   Airway Mallampati: II  TM Distance: >3 FB Neck ROM: full    Dental no notable dental hx.    Pulmonary Current Smoker,    Pulmonary exam normal breath sounds clear to auscultation       Cardiovascular Exercise Tolerance: Good hypertension, Pt. on medications  Rhythm:regular Rate:Normal     Neuro/Psych negative neurological ROS  negative psych ROS   GI/Hepatic negative GI ROS, Neg liver ROS,   Endo/Other  negative endocrine ROS  Renal/GU negative Renal ROS  negative genitourinary   Musculoskeletal  (+) Arthritis , Osteoarthritis,    Abdominal   Peds  Hematology negative hematology ROS (+)   Anesthesia Other Findings   Reproductive/Obstetrics negative OB ROS                             Anesthesia Physical Anesthesia Plan  ASA: II  Anesthesia Plan: MAC and Spinal   Post-op Pain Management:  Regional for Post-op pain   Induction:   PONV Risk Score and Plan: 2 and Ondansetron and Treatment may vary due to age or medical condition  Airway Management Planned: Nasal Cannula, Natural Airway and Mask  Additional Equipment:   Intra-op Plan:   Post-operative Plan:   Informed Consent: I have reviewed the patients History and Physical, chart, labs and discussed the procedure including the risks, benefits and alternatives for the proposed anesthesia with the patient or authorized representative who has indicated his/her understanding and acceptance.   Dental Advisory Given  Plan Discussed with: CRNA and Anesthesiologist  Anesthesia Plan Comments: (  )        Anesthesia Quick Evaluation

## 2018-03-26 NOTE — H&P (Addendum)
PREOPERATIVE H&P  Chief Complaint: right knee degenerative joint disease  HPI: Nicholas Huffman is a 72 y.o. male who presents for surgical treatment of right knee degenerative joint disease.  He denies any changes in medical history.  Past Medical History:  Diagnosis Date  . Cancer ([place])    basal cell  . Cataract   . Fractured skull ([place])    as a child  . Hyperlipidemia   . Hypertension   . Primary localized osteoarthritis of right knee   . Smoker   . Wears glasses   . Wears hearing aid in both ears    Past Surgical History:  Procedure Laterality Date  . BASAL CELL CARCINOMA EXCISION  2016   face  . CATARACT EXTRACTION W/ INTRAOCULAR LENS  IMPLANT, BILATERAL    . COLONOSCOPY  2008, 2002   hx polyps/Dr. Fuller Plan  . EYE SURGERY     retina detachment repair x 2  . KNEE ARTHROSCOPY  April/2013   left   Social History   Socioeconomic History  . Marital status: Married    Spouse name: Not on file  . Number of children: Not on file  . Years of education: Not on file  . Highest education level: Not on file  Occupational History  . Not on file  Social Needs  . Financial resource strain: Not on file  . Food insecurity:    Worry: Not on file    Inability: Not on file  . Transportation needs:    Medical: Not on file    Non-medical: Not on file  Tobacco Use  . Smoking status: Current Every Day Smoker    Packs/day: 1.00    Types: Cigarettes  . Smokeless tobacco: Never Used  . Tobacco comment: pt to call PCP for patch  Substance and Sexual Activity  . Alcohol use: No  . Drug use: No  . Sexual activity: Not on file  Lifestyle  . Physical activity:    Days per week: Not on file    Minutes per session: Not on file  . Stress: Not on file  Relationships  . Social connections:    Talks on phone: Not on file    Gets together: Not on file    Attends religious service: Not on file    Active member of club or organization: Not on file    Attends meetings of clubs or  organizations: Not on file    Relationship status: Not on file  Other Topics Concern  . Not on file  Social History Narrative  . Not on file   Family History  Problem Relation Age of Onset  . Breast cancer Mother   . Colon cancer Neg Hx    No Known Allergies Prior to Admission medications   Medication Sig Start Date End Date Taking? Authorizing Provider  amLODipine (NORVASC) 10 MG tablet TAKE 1 TABLET (10 MG TOTAL) BY MOUTH DAILY. 03/06/18  Yes Susy Frizzle, MD  aspirin EC 81 MG tablet Take 81 mg by mouth daily.   Yes [provider]  atorvastatin (LIPITOR) 40 MG tablet TAKE 1 TABLET BY MOUTH ONCE DAILY AT 6PM 03/06/18  Yes Susy Frizzle, MD  hydrochlorothiazide (HYDRODIURIL) 25 MG tablet Take 25 mg by mouth daily.   Yes [provider]  losartan-hydrochlorothiazide (HYZAAR) 100-25 MG tablet TAKE 1 TABLET BY MOUTH DAILY. 03/18/18  Yes Susy Frizzle, MD  meloxicam (MOBIC) 15 MG tablet Take 1 tablet (15 mg total) by mouth  daily. Patient not taking: Reported on 03/16/2018 01/15/18   Susy Frizzle, MD     Positive ROS: All other systems have been reviewed and were otherwise negative with the exception of those mentioned in the HPI and as above.  Physical Exam: General: Alert, no acute distress Cardiovascular: No pedal edema Respiratory: No cyanosis, no use of accessory musculature GI: abdomen soft Skin: No lesions in the area of chief complaint Neurologic: Sensation intact distally Psychiatric: Patient is competent for consent with normal mood and affect Lymphatic: no lymphedema  MUSCULOSKELETAL: exam stable  Assessment: right knee degenerative joint disease  Plan: Plan for Procedure(s): RIGHT TOTAL KNEE ARTHROPLASTY  The risks benefits and alternatives were discussed with the patient including but not limited to the risks of nonoperative treatment, versus surgical intervention including infection, bleeding, nerve injury,  blood clots,  cardiopulmonary complications, morbidity, mortality, among others, and they were willing to proceed.   Anticipated LOS equal to or greater than 2 midnights due to - Age 12 and older with one or more of the following:  - Obesity  - Expected need for hospital services (PT, OT, Nursing) required for safe discharge  - Anticipated need for postoperative skilled nursing care or inpatient rehab  - Active co-morbidities: None   Eduard Roux, MD   03/26/2018 11:01 AM

## 2018-03-26 NOTE — Anesthesia Procedure Notes (Signed)
Spinal  Patient location during procedure: OR Start time: 03/26/2018 12:19 PM End time: 03/26/2018 12:22 PM Staffing Anesthesiologist: Janeece Riggers, MD Preanesthetic Checklist Completed: patient identified, site marked, surgical consent, pre-op evaluation, timeout performed, IV checked, risks and benefits discussed and monitors and equipment checked Spinal Block Patient position: sitting Prep: DuraPrep Patient monitoring: heart rate, cardiac monitor, continuous pulse ox and blood pressure Approach: midline Location: L4-5 Injection technique: single-shot Needle Needle type: Sprotte  Needle gauge: 24 G Needle length: 9 cm Assessment Sensory level: T4

## 2018-03-27 DIAGNOSIS — E669 Obesity, unspecified: Secondary | ICD-10-CM | POA: Diagnosis not present

## 2018-03-27 DIAGNOSIS — D62 Acute posthemorrhagic anemia: Secondary | ICD-10-CM | POA: Diagnosis not present

## 2018-03-27 DIAGNOSIS — E785 Hyperlipidemia, unspecified: Secondary | ICD-10-CM | POA: Diagnosis not present

## 2018-03-27 DIAGNOSIS — I10 Essential (primary) hypertension: Secondary | ICD-10-CM | POA: Diagnosis not present

## 2018-03-27 DIAGNOSIS — F17210 Nicotine dependence, cigarettes, uncomplicated: Secondary | ICD-10-CM | POA: Diagnosis not present

## 2018-03-27 DIAGNOSIS — M1711 Unilateral primary osteoarthritis, right knee: Secondary | ICD-10-CM | POA: Diagnosis not present

## 2018-03-27 LAB — BASIC METABOLIC PANEL
Anion gap: 10 (ref 5–15)
BUN: 20 mg/dL (ref 6–20)
CALCIUM: 8.4 mg/dL — AB (ref 8.9–10.3)
CO2: 23 mmol/L (ref 22–32)
Chloride: 104 mmol/L (ref 101–111)
Creatinine, Ser: 1.11 mg/dL (ref 0.61–1.24)
Glucose, Bld: 94 mg/dL (ref 65–99)
Potassium: 3.4 mmol/L — ABNORMAL LOW (ref 3.5–5.1)
SODIUM: 137 mmol/L (ref 135–145)

## 2018-03-27 LAB — CBC
HCT: 41.6 % (ref 39.0–52.0)
Hemoglobin: 13.9 g/dL (ref 13.0–17.0)
MCH: 30.8 pg (ref 26.0–34.0)
MCHC: 33.4 g/dL (ref 30.0–36.0)
MCV: 92 fL (ref 78.0–100.0)
PLATELETS: 220 10*3/uL (ref 150–400)
RBC: 4.52 MIL/uL (ref 4.22–5.81)
RDW: 14.3 % (ref 11.5–15.5)
WBC: 8 10*3/uL (ref 4.0–10.5)

## 2018-03-27 NOTE — Progress Notes (Signed)
   Subjective:  Patient reports pain as mild.  Did very well with PT  Objective:   VITALS:   Vitals:   03/26/18 1900 03/26/18 2019 03/27/18 0025 03/27/18 0537  BP:  134/66 125/60 (!) 150/74  Pulse:  63 66 60  Resp:  16 14 17   Temp:  97.9 F (36.6 C) 98 F (36.7 C) 97.6 F (36.4 C)  TempSrc:  Oral Oral Axillary  SpO2: 94% 93% 90% 98%  Weight:      Height:        Neurologically intact Neurovascular intact Sensation intact distally Intact pulses distally Dorsiflexion/Plantar flexion intact Incision: dressing C/D/I and no drainage No cellulitis present Compartment soft   Lab Results  Component Value Date   WBC 8.0 03/27/2018   HGB 13.9 03/27/2018   HCT 41.6 03/27/2018   MCV 92.0 03/27/2018   PLT 220 03/27/2018     Assessment/Plan:  1 Day Post-Op   - Expected postop acute blood loss anemia - will monitor for symptoms - Up with PT/OT - progressing very well, cleared stairs - DVT ppx - SCDs, ambulation, aspirin - WBAT operative extremity - Pain control - Discharge planning - dc today after equipment delivered   Patient's anticipated LOS is less than 2 midnights, meeting these requirements: - Younger than 56 - Lives within 1 hour of care - Has a competent adult at home to recover with post-op recover - NO history of  - Chronic pain requiring opiods  - Diabetes  - Coronary Artery Disease  - Heart failure  - Heart attack  - Stroke  - DVT/VTE  - Cardiac arrhythmia  - Respiratory Failure/COPD  - Renal failure  - Anemia  - Advanced Liver disease      Eduard Roux 03/27/2018, 10:11 AM [phone]

## 2018-03-27 NOTE — Care Management Note (Signed)
Case Management Note  Patient Details  Name: Nicholas Huffman MRN: [id number] Date of Birth: 16-Dec-1945  Subjective/Objective: 72 yr old gentleman s/p right total knee arthroplasty.                   Action/Plan: Case manager spoke with patient and his wife concerning discharge plan. Patient was preoperatively setup with [facility], no changes. He will have family support at discharge.    Expected Discharge Date:  03/27/18               Expected Discharge Plan:  [place]  In-House Referral:  NA  Discharge planning Services  CM Consult  Post Acute Care Choice:  Durable Medical Equipment, Home Health Choice offered to:  Patient, Spouse  DME Arranged:  3-N-1, Walker rolling DME Agency:  [place]:  [place]:  [facility] (formerly [facility])  Status of Service:  Completed, signed off  If discussed at [employer], dates discussed:    Additional Comments:  Ninfa Meeker, RN 03/27/2018, 11:30 AM

## 2018-03-27 NOTE — Progress Notes (Signed)
AVS given and reviewed with pt and family. All questions answered to satisfaction. Printed prescriptions provided. Pt escorted off the unit via wheelchair by this RN.

## 2018-03-27 NOTE — Discharge Summary (Signed)
Physician Discharge Summary      Patient ID: Nicholas Huffman MRN: [id number] DOB/AGE: 72-Dec-1947 72 y.o.  Admit date: 03/26/2018 Discharge date: 03/27/2018  Admission Diagnoses:  <principal problem not specified>  Discharge Diagnoses:  Active Problems:   Total knee replacement status   Past Medical History:  Diagnosis Date  . Cancer ([place])    basal cell  . Cataract   . Fractured skull ([place])    as a child  . Hyperlipidemia   . Hypertension   . Primary localized osteoarthritis of right knee   . Smoker   . Wears glasses   . Wears hearing aid in both ears     Surgeries: Procedure(s): RIGHT TOTAL KNEE ARTHROPLASTY on 03/26/2018   Consultants (if any):   Discharged Condition: Improved  Hospital Course: FARZAD TIBBETTS is an 72 y.o. male who was admitted 03/26/2018 with a diagnosis of <principal problem not specified> and went to the operating room on 03/26/2018 and underwent the above named procedures.    He was given perioperative antibiotics:  Anti-infectives (From admission, onward)   Start     Dose/Rate Route Frequency Ordered Stop   03/26/18 1830  ceFAZolin (ANCEF) IVPB 2g/100 mL premix     2 g 200 mL/hr over 30 Minutes Intravenous Every 6 hours 03/26/18 1803 03/27/18 1229   03/26/18 1301  vancomycin (VANCOCIN) powder  Status:  Discontinued       As needed 03/26/18 1301 03/26/18 1443   03/26/18 0919  ceFAZolin (ANCEF) IVPB 2g/100 mL premix     2 g 200 mL/hr over 30 Minutes Intravenous On call to O.R. 03/26/18 0919 03/26/18 1218    .  He was given sequential compression devices, early ambulation, and aspirin for DVT prophylaxis.  He benefited maximally from the hospital stay and there were no complications.    Recent vital signs:  Vitals:   03/27/18 0025 03/27/18 0537  BP: 125/60 (!) 150/74  Pulse: 66 60  Resp: 14 17  Temp: 98 F (36.7 C) 97.6 F (36.4 C)  SpO2: 90% 98%    Recent laboratory studies:  Lab Results  Component Value Date   HGB 13.9  03/27/2018   HGB 16.3 03/17/2018   HGB 16.3 01/15/2018   Lab Results  Component Value Date   WBC 8.0 03/27/2018   PLT 220 03/27/2018   Lab Results  Component Value Date   INR 0.95 03/17/2018   Lab Results  Component Value Date   NA 137 03/27/2018   K 3.4 (L) 03/27/2018   CL 104 03/27/2018   CO2 23 03/27/2018   BUN 20 03/27/2018   CREATININE 1.11 03/27/2018   GLUCOSE 94 03/27/2018    Discharge Medications:   Allergies as of 03/27/2018   No Known Allergies     Medication List    TAKE these medications   amLODipine 10 MG tablet Commonly known as:  NORVASC TAKE 1 TABLET (10 MG TOTAL) BY MOUTH DAILY.   aspirin EC 81 MG tablet Take 1 tablet (81 mg total) by mouth 2 (two) times daily. What changed:  when to take this   atorvastatin 40 MG tablet Commonly known as:  LIPITOR TAKE 1 TABLET BY MOUTH ONCE DAILY AT 6PM   hydrochlorothiazide 25 MG tablet Commonly known as:  HYDRODIURIL Take 25 mg by mouth daily.   losartan-hydrochlorothiazide 100-25 MG tablet Commonly known as:  HYZAAR TAKE 1 TABLET BY MOUTH DAILY.   meloxicam 15 MG tablet Commonly known as:  MOBIC Take 1 tablet (15  mg total) by mouth daily.   methocarbamol 750 MG tablet Commonly known as:  ROBAXIN Take 1 tablet (750 mg total) by mouth 2 (two) times daily as needed for muscle spasms.   ondansetron 4 MG tablet Commonly known as:  ZOFRAN Take 1-2 tablets (4-8 mg total) by mouth every 8 (eight) hours as needed for nausea or vomiting.   oxyCODONE 10 mg 12 hr tablet Commonly known as:  OXYCONTIN Take 1 tablet (10 mg total) by mouth every 12 (twelve) hours for 3 days.   oxyCODONE 5 MG immediate release tablet Commonly known as:  Oxy IR/ROXICODONE Take 1-3 tablets (5-15 mg total) by mouth every 4 (four) hours as needed.   promethazine 25 MG tablet Commonly known as:  PHENERGAN Take 1 tablet (25 mg total) by mouth every 6 (six) hours as needed for nausea.   senna-docusate 8.6-50 MG tablet Commonly  known as:  SENOKOT S Take 1 tablet by mouth at bedtime as needed.            Durable Medical Equipment  (From admission, onward)        Start     Ordered   03/26/18 1801  DME Walker rolling  Once    Question:  Patient needs a walker to treat with the following condition  Answer:  Total knee replacement status   03/26/18 1803   03/26/18 1801  DME 3 n 1  Once     03/26/18 1803   03/26/18 1801  DME Bedside commode  Once    Question:  Patient needs a bedside commode to treat with the following condition  Answer:  Total knee replacement status   03/26/18 1803      Diagnostic Studies: No results found.  Disposition: Discharge disposition: 01-Home or Self Care       Discharge Instructions    Call MD / Call 911   Complete by:  As directed    If you experience chest pain or shortness of breath, CALL 911 and be transported to the hospital emergency room.  If you develope a fever above 101.5 F, pus (white drainage) or increased drainage or redness at the wound, or calf pain, call your surgeon's office.   Constipation Prevention   Complete by:  As directed    Drink plenty of fluids.  Prune juice may be helpful.  You may use a stool softener, such as Colace (over the counter) 100 mg twice a day.  Use MiraLax (over the counter) for constipation as needed.   Driving restrictions   Complete by:  As directed    No driving while taking narcotic pain meds.   Increase activity slowly as tolerated   Complete by:  As directed       Follow-up Information    Leandrew Koyanagi, MD In 2 weeks.   Specialty:  Orthopedic Surgery Why:  For suture removal, For wound re-check Contact information: [place] [address] [phone]            Signed: Eduard Roux 03/27/2018, 10:12 AM

## 2018-03-27 NOTE — Evaluation (Signed)
Physical Therapy Evaluation and Discharge Patient Details Name: Nicholas Huffman MRN: [id number] DOB: [birth year] Today's Date: 03/27/2018   History of Present Illness  72 y.o. male s/p R TKA 03/26/18. PMH includes: HTN, basal cell CA  Clinical Impression  Patient evaluated by Physical Therapy with no further acute PT needs identified. All education has been completed and the patient has no further questions. PTA, pt living with wife in 1 story home with 2 steps to enter, independent with mobility. Upon eval pt presents with mild post op pain and weakness. Pt is supervision level for all mobility including stairs. Ambulating long distances without increase in pain or fatigue.  Pt reports confidence in returning home today if medically cleared for d/c. Reviewed therex and safety considerations for home, no concerns at this time for safe return home from PT standpoint.  See below for any follow-up Physical Therapy or equipment needs. PT is signing off. Thank you for this referral.     Follow Up Recommendations Follow surgeon's recommendation for DC plan and follow-up therapies;Supervision for mobility/OOB;Outpatient PT    Equipment Recommendations  Rolling walker with 5" wheels;3in1 (PT)    Recommendations for Other Services       Precautions / Restrictions Precautions Precautions: Knee Precaution Booklet Issued: Yes (comment) Precaution Comments: reveiwed supine therex and no pillow under knee Restrictions Weight Bearing Restrictions: Yes RLE Weight Bearing: Weight bearing as tolerated      Mobility  Bed Mobility Overal bed mobility: Modified Independent                Transfers Overall transfer level: Modified independent Equipment used: Rolling walker (2 wheeled);None                Ambulation/Gait Ambulation/Gait assistance: Supervision Ambulation Distance (Feet): 300 Feet Assistive device: Rolling walker (2 wheeled) Gait Pattern/deviations: Step-through  pattern Gait velocity: slight decrease   General Gait Details: patient with step through pattern and good velocity. cues for heel strike. no LOB, supervision for safety.   Stairs Stairs: Yes Stairs assistance: Supervision Stair Management: One rail Left;Forwards;Step to pattern Number of Stairs: 4 General stair comments: step to pattern, cues for sequencing and use of UE for support. patient with no LOB or concerns this visit.   Wheelchair Mobility    Modified Rankin (Stroke Patients Only)       Balance Overall balance assessment: Mild deficits observed, not formally tested                                           Pertinent Vitals/Pain Pain Assessment: Faces Faces Pain Scale: Hurts a little bit Pain Location: R knee Pain Descriptors / Indicators: Discomfort Pain Intervention(s): Limited activity within patient's tolerance;Monitored during session;Premedicated before session;Repositioned    Home Living Family/patient expects to be discharged to:: Private residence Living Arrangements: Spouse/significant other Available Help at Discharge: Family;Available 24 hours/day Type of Home: House Home Access: Stairs to enter Entrance Stairs-Rails: Left Entrance Stairs-Number of Steps: 2 Home Layout: One level        Prior Function Level of Independence: Independent               Hand Dominance        Extremity/Trunk Assessment   Upper Extremity Assessment Upper Extremity Assessment: Overall WFL for tasks assessed    Lower Extremity Assessment Lower Extremity Assessment: Overall WFL for tasks assessed(RLE 3/5 post op  pain/weakness consistent w/ above procedure)    Cervical / Trunk Assessment Cervical / Trunk Assessment: Normal  Communication      Cognition Arousal/Alertness: Awake/alert Behavior During Therapy: WFL for tasks assessed/performed Overall Cognitive Status: Within Functional Limits for tasks assessed                                         General Comments      Exercises Total Joint Exercises Ankle Circles/Pumps: 20 reps Quad Sets: 10 reps Heel Slides: 10 reps Hip ABduction/ADduction: 10 reps Long Arc Quad: 10 reps Knee Flexion: 10 reps   Assessment/Plan    PT Assessment All further PT needs can be met in the next venue of care  PT Problem List Decreased strength;Decreased activity tolerance;Decreased range of motion;Decreased balance;Decreased mobility;Pain       PT Treatment Interventions      PT Goals (Current goals can be found in the Care Plan section)  Acute Rehab PT Goals Patient Stated Goal: return home today PT Goal Formulation: With patient Time For Goal Achievement: 04/03/18 Potential to Achieve Goals: Good    Frequency     Barriers to discharge        Co-evaluation               AM-PAC PT "6 Clicks" Daily Activity  Outcome Measure Difficulty turning over in bed (including adjusting bedclothes, sheets and blankets)?: None Difficulty moving from lying on back to sitting on the side of the bed? : None Difficulty sitting down on and standing up from a chair with arms (e.g., wheelchair, bedside commode, etc,.)?: None Help needed moving to and from a bed to chair (including a wheelchair)?: None Help needed walking in hospital room?: A Little Help needed climbing 3-5 steps with a railing? : A Little 6 Click Score: 22    End of Session Equipment Utilized During Treatment: Gait belt Activity Tolerance: Patient tolerated treatment well Patient left: in chair;with call bell/phone within reach Nurse Communication: Mobility status PT Visit Diagnosis: Pain;Other abnormalities of gait and mobility (R26.89) Pain - Right/Left: Right Pain - part of body: Knee    Time: 0851-0920 PT Time Calculation (min) (ACUTE ONLY): 29 min   Charges:   PT Evaluation $PT Eval Low Complexity: 1 Low PT Treatments $Gait Training: 8-22 mins   PT G Codes:        Reinaldo Berber, PT, DPT Acute Rehab Services Pager: [phone]    Reinaldo Berber 03/27/2018, 9:29 AM

## 2018-03-28 DIAGNOSIS — I10 Essential (primary) hypertension: Secondary | ICD-10-CM | POA: Diagnosis not present

## 2018-03-28 DIAGNOSIS — F17210 Nicotine dependence, cigarettes, uncomplicated: Secondary | ICD-10-CM | POA: Diagnosis not present

## 2018-03-28 DIAGNOSIS — Z96651 Presence of right artificial knee joint: Secondary | ICD-10-CM | POA: Diagnosis not present

## 2018-03-28 DIAGNOSIS — Z471 Aftercare following joint replacement surgery: Secondary | ICD-10-CM | POA: Diagnosis not present

## 2018-03-28 DIAGNOSIS — Z85828 Personal history of other malignant neoplasm of skin: Secondary | ICD-10-CM | POA: Diagnosis not present

## 2018-03-29 ENCOUNTER — Encounter (HOSPITAL_COMMUNITY): Payer: Self-pay | Admitting: Orthopaedic Surgery

## 2018-03-30 DIAGNOSIS — F17210 Nicotine dependence, cigarettes, uncomplicated: Secondary | ICD-10-CM | POA: Diagnosis not present

## 2018-03-30 DIAGNOSIS — Z471 Aftercare following joint replacement surgery: Secondary | ICD-10-CM | POA: Diagnosis not present

## 2018-03-30 DIAGNOSIS — Z85828 Personal history of other malignant neoplasm of skin: Secondary | ICD-10-CM | POA: Diagnosis not present

## 2018-03-30 DIAGNOSIS — I10 Essential (primary) hypertension: Secondary | ICD-10-CM | POA: Diagnosis not present

## 2018-03-30 DIAGNOSIS — Z96651 Presence of right artificial knee joint: Secondary | ICD-10-CM | POA: Diagnosis not present

## 2018-03-31 DIAGNOSIS — Z471 Aftercare following joint replacement surgery: Secondary | ICD-10-CM | POA: Diagnosis not present

## 2018-03-31 DIAGNOSIS — Z85828 Personal history of other malignant neoplasm of skin: Secondary | ICD-10-CM | POA: Diagnosis not present

## 2018-03-31 DIAGNOSIS — F17210 Nicotine dependence, cigarettes, uncomplicated: Secondary | ICD-10-CM | POA: Diagnosis not present

## 2018-03-31 DIAGNOSIS — I10 Essential (primary) hypertension: Secondary | ICD-10-CM | POA: Diagnosis not present

## 2018-03-31 DIAGNOSIS — Z96651 Presence of right artificial knee joint: Secondary | ICD-10-CM | POA: Diagnosis not present

## 2018-04-01 DIAGNOSIS — I10 Essential (primary) hypertension: Secondary | ICD-10-CM | POA: Diagnosis not present

## 2018-04-01 DIAGNOSIS — Z85828 Personal history of other malignant neoplasm of skin: Secondary | ICD-10-CM | POA: Diagnosis not present

## 2018-04-01 DIAGNOSIS — Z471 Aftercare following joint replacement surgery: Secondary | ICD-10-CM | POA: Diagnosis not present

## 2018-04-01 DIAGNOSIS — Z96651 Presence of right artificial knee joint: Secondary | ICD-10-CM | POA: Diagnosis not present

## 2018-04-01 DIAGNOSIS — F17210 Nicotine dependence, cigarettes, uncomplicated: Secondary | ICD-10-CM | POA: Diagnosis not present

## 2018-04-03 DIAGNOSIS — Z85828 Personal history of other malignant neoplasm of skin: Secondary | ICD-10-CM | POA: Diagnosis not present

## 2018-04-03 DIAGNOSIS — F17210 Nicotine dependence, cigarettes, uncomplicated: Secondary | ICD-10-CM | POA: Diagnosis not present

## 2018-04-03 DIAGNOSIS — Z471 Aftercare following joint replacement surgery: Secondary | ICD-10-CM | POA: Diagnosis not present

## 2018-04-03 DIAGNOSIS — I10 Essential (primary) hypertension: Secondary | ICD-10-CM | POA: Diagnosis not present

## 2018-04-03 DIAGNOSIS — Z96651 Presence of right artificial knee joint: Secondary | ICD-10-CM | POA: Diagnosis not present

## 2018-04-06 DIAGNOSIS — M25661 Stiffness of right knee, not elsewhere classified: Secondary | ICD-10-CM | POA: Diagnosis not present

## 2018-04-06 DIAGNOSIS — Z96651 Presence of right artificial knee joint: Secondary | ICD-10-CM | POA: Diagnosis not present

## 2018-04-06 DIAGNOSIS — M6281 Muscle weakness (generalized): Secondary | ICD-10-CM | POA: Diagnosis not present

## 2018-04-06 DIAGNOSIS — M25562 Pain in left knee: Secondary | ICD-10-CM | POA: Diagnosis not present

## 2018-04-06 DIAGNOSIS — Z471 Aftercare following joint replacement surgery: Secondary | ICD-10-CM | POA: Diagnosis not present

## 2018-04-06 DIAGNOSIS — R2689 Other abnormalities of gait and mobility: Secondary | ICD-10-CM | POA: Diagnosis not present

## 2018-04-08 DIAGNOSIS — R2689 Other abnormalities of gait and mobility: Secondary | ICD-10-CM | POA: Diagnosis not present

## 2018-04-08 DIAGNOSIS — M6281 Muscle weakness (generalized): Secondary | ICD-10-CM | POA: Diagnosis not present

## 2018-04-08 DIAGNOSIS — Z96651 Presence of right artificial knee joint: Secondary | ICD-10-CM | POA: Diagnosis not present

## 2018-04-08 DIAGNOSIS — Z471 Aftercare following joint replacement surgery: Secondary | ICD-10-CM | POA: Diagnosis not present

## 2018-04-08 DIAGNOSIS — M25562 Pain in left knee: Secondary | ICD-10-CM | POA: Diagnosis not present

## 2018-04-08 DIAGNOSIS — M25661 Stiffness of right knee, not elsewhere classified: Secondary | ICD-10-CM | POA: Diagnosis not present

## 2018-04-09 ENCOUNTER — Ambulatory Visit (INDEPENDENT_AMBULATORY_CARE_PROVIDER_SITE_OTHER): Payer: Medicare Other | Admitting: Orthopaedic Surgery

## 2018-04-09 ENCOUNTER — Encounter (INDEPENDENT_AMBULATORY_CARE_PROVIDER_SITE_OTHER): Payer: Self-pay | Admitting: Orthopaedic Surgery

## 2018-04-09 DIAGNOSIS — Z96651 Presence of right artificial knee joint: Secondary | ICD-10-CM

## 2018-04-09 NOTE — Progress Notes (Signed)
Post-Op Visit Note   Patient: Nicholas Huffman           Date of Birth: 1946-11-06           MRN: [id number] Visit Date: 04/09/2018 PCP: Susy Frizzle, MD   Assessment & Plan:  Chief Complaint:  Chief Complaint  Patient presents with  . Right Knee - Routine Post Op   Visit Diagnoses:  1. History of total knee replacement, right     Plan: Patient is a pleasant 72 year old gentleman who presents to our clinic today 14 days status post right total knee replacement, date of surgery 03/26/2018.  He has been doing excellent.  He is taking Robaxin for pain but nothing more.  He started outpatient physical therapy this past Monday.  He is ambulating with a walker.  No fevers, chills or any other systemic symptoms.  Examination of his right knee reveals a well-healing surgical incision without evidence of infection.  Calves are soft and nontender.  He is neurovascularly intact distally.  At this point, we will have patient continue with formal physical therapy.  He will follow-up with [place] in 4 weeks time for repeat evaluation and x-ray      Procedure Note  Patient: Nicholas Huffman             Date of Birth: March 01, 1946           MRN: [id number]             Visit Date: 04/09/2018  Procedures: Visit Diagnoses: History of total knee replacement, right  No procedures performed    Follow-Up Instructions: Return in about 1 month (around 05/07/2018).   Orders:  No orders of the defined types were placed in this encounter.  No orders of the defined types were placed in this encounter.   Imaging: No results found.  PMFS History: Patient Active Problem List   Diagnosis Date Noted  . History of total knee replacement, right 03/26/2018  . Primary localized osteoarthritis of right knee 02/17/2018  . Hyperlipidemia   . Hypertension   . Smoker    Past Medical History:  Diagnosis Date  . Cancer ([place])    basal cell  . Cataract   . Fractured skull ([place])    as a child  .  Hyperlipidemia   . Hypertension   . Primary localized osteoarthritis of right knee   . Smoker   . Wears glasses   . Wears hearing aid in both ears     Family History  Problem Relation Age of Onset  . Breast cancer Mother   . Colon cancer Neg Hx     Past Surgical History:  Procedure Laterality Date  . BASAL CELL CARCINOMA EXCISION  2016   face  . CATARACT EXTRACTION W/ INTRAOCULAR LENS  IMPLANT, BILATERAL    . COLONOSCOPY  2008, 2002   hx polyps/Dr. Fuller Plan  . EYE SURGERY     retina detachment repair x 2  . KNEE ARTHROSCOPY  April/2013   left  . TOTAL KNEE ARTHROPLASTY Right 03/26/2018   Procedure: RIGHT TOTAL KNEE ARTHROPLASTY;  Surgeon: Leandrew Koyanagi, MD;  Location: [facility];  Service: Orthopedics;  Laterality: Right;   Social History   Occupational History  . Not on file  Tobacco Use  . Smoking status: Current Every Day Smoker    Packs/day: 1.00    Types: Cigarettes  . Smokeless tobacco: Never Used  . Tobacco comment: pt to call PCP for patch  Substance  and Sexual Activity  . Alcohol use: No  . Drug use: No  . Sexual activity: Not on file

## 2018-04-10 DIAGNOSIS — Z471 Aftercare following joint replacement surgery: Secondary | ICD-10-CM | POA: Diagnosis not present

## 2018-04-10 DIAGNOSIS — M25562 Pain in left knee: Secondary | ICD-10-CM | POA: Diagnosis not present

## 2018-04-10 DIAGNOSIS — Z96651 Presence of right artificial knee joint: Secondary | ICD-10-CM | POA: Diagnosis not present

## 2018-04-10 DIAGNOSIS — M25661 Stiffness of right knee, not elsewhere classified: Secondary | ICD-10-CM | POA: Diagnosis not present

## 2018-04-10 DIAGNOSIS — R2689 Other abnormalities of gait and mobility: Secondary | ICD-10-CM | POA: Diagnosis not present

## 2018-04-10 DIAGNOSIS — M6281 Muscle weakness (generalized): Secondary | ICD-10-CM | POA: Diagnosis not present

## 2018-04-13 DIAGNOSIS — Z471 Aftercare following joint replacement surgery: Secondary | ICD-10-CM | POA: Diagnosis not present

## 2018-04-13 DIAGNOSIS — Z96651 Presence of right artificial knee joint: Secondary | ICD-10-CM | POA: Diagnosis not present

## 2018-04-13 DIAGNOSIS — M25562 Pain in left knee: Secondary | ICD-10-CM | POA: Diagnosis not present

## 2018-04-13 DIAGNOSIS — R2689 Other abnormalities of gait and mobility: Secondary | ICD-10-CM | POA: Diagnosis not present

## 2018-04-13 DIAGNOSIS — M25661 Stiffness of right knee, not elsewhere classified: Secondary | ICD-10-CM | POA: Diagnosis not present

## 2018-04-13 DIAGNOSIS — M6281 Muscle weakness (generalized): Secondary | ICD-10-CM | POA: Diagnosis not present

## 2018-04-15 DIAGNOSIS — M6281 Muscle weakness (generalized): Secondary | ICD-10-CM | POA: Diagnosis not present

## 2018-04-15 DIAGNOSIS — R2689 Other abnormalities of gait and mobility: Secondary | ICD-10-CM | POA: Diagnosis not present

## 2018-04-15 DIAGNOSIS — M25562 Pain in left knee: Secondary | ICD-10-CM | POA: Diagnosis not present

## 2018-04-15 DIAGNOSIS — Z96651 Presence of right artificial knee joint: Secondary | ICD-10-CM | POA: Diagnosis not present

## 2018-04-15 DIAGNOSIS — M25661 Stiffness of right knee, not elsewhere classified: Secondary | ICD-10-CM | POA: Diagnosis not present

## 2018-04-15 DIAGNOSIS — Z471 Aftercare following joint replacement surgery: Secondary | ICD-10-CM | POA: Diagnosis not present

## 2018-04-17 DIAGNOSIS — Z471 Aftercare following joint replacement surgery: Secondary | ICD-10-CM | POA: Diagnosis not present

## 2018-04-17 DIAGNOSIS — R2689 Other abnormalities of gait and mobility: Secondary | ICD-10-CM | POA: Diagnosis not present

## 2018-04-17 DIAGNOSIS — M6281 Muscle weakness (generalized): Secondary | ICD-10-CM | POA: Diagnosis not present

## 2018-04-17 DIAGNOSIS — M25661 Stiffness of right knee, not elsewhere classified: Secondary | ICD-10-CM | POA: Diagnosis not present

## 2018-04-17 DIAGNOSIS — M25562 Pain in left knee: Secondary | ICD-10-CM | POA: Diagnosis not present

## 2018-04-17 DIAGNOSIS — Z96651 Presence of right artificial knee joint: Secondary | ICD-10-CM | POA: Diagnosis not present

## 2018-04-20 DIAGNOSIS — M25562 Pain in left knee: Secondary | ICD-10-CM | POA: Diagnosis not present

## 2018-04-20 DIAGNOSIS — Z96651 Presence of right artificial knee joint: Secondary | ICD-10-CM | POA: Diagnosis not present

## 2018-04-20 DIAGNOSIS — M25661 Stiffness of right knee, not elsewhere classified: Secondary | ICD-10-CM | POA: Diagnosis not present

## 2018-04-20 DIAGNOSIS — R2689 Other abnormalities of gait and mobility: Secondary | ICD-10-CM | POA: Diagnosis not present

## 2018-04-20 DIAGNOSIS — Z471 Aftercare following joint replacement surgery: Secondary | ICD-10-CM | POA: Diagnosis not present

## 2018-04-20 DIAGNOSIS — M6281 Muscle weakness (generalized): Secondary | ICD-10-CM | POA: Diagnosis not present

## 2018-04-22 DIAGNOSIS — M6281 Muscle weakness (generalized): Secondary | ICD-10-CM | POA: Diagnosis not present

## 2018-04-22 DIAGNOSIS — Z471 Aftercare following joint replacement surgery: Secondary | ICD-10-CM | POA: Diagnosis not present

## 2018-04-22 DIAGNOSIS — M25661 Stiffness of right knee, not elsewhere classified: Secondary | ICD-10-CM | POA: Diagnosis not present

## 2018-04-22 DIAGNOSIS — M25562 Pain in left knee: Secondary | ICD-10-CM | POA: Diagnosis not present

## 2018-04-22 DIAGNOSIS — R2689 Other abnormalities of gait and mobility: Secondary | ICD-10-CM | POA: Diagnosis not present

## 2018-04-22 DIAGNOSIS — Z96651 Presence of right artificial knee joint: Secondary | ICD-10-CM | POA: Diagnosis not present

## 2018-04-24 DIAGNOSIS — Z96651 Presence of right artificial knee joint: Secondary | ICD-10-CM | POA: Diagnosis not present

## 2018-04-24 DIAGNOSIS — M25661 Stiffness of right knee, not elsewhere classified: Secondary | ICD-10-CM | POA: Diagnosis not present

## 2018-04-24 DIAGNOSIS — Z471 Aftercare following joint replacement surgery: Secondary | ICD-10-CM | POA: Diagnosis not present

## 2018-04-24 DIAGNOSIS — M25562 Pain in left knee: Secondary | ICD-10-CM | POA: Diagnosis not present

## 2018-04-24 DIAGNOSIS — M6281 Muscle weakness (generalized): Secondary | ICD-10-CM | POA: Diagnosis not present

## 2018-04-24 DIAGNOSIS — R2689 Other abnormalities of gait and mobility: Secondary | ICD-10-CM | POA: Diagnosis not present

## 2018-04-27 DIAGNOSIS — Z96651 Presence of right artificial knee joint: Secondary | ICD-10-CM | POA: Diagnosis not present

## 2018-04-27 DIAGNOSIS — Z471 Aftercare following joint replacement surgery: Secondary | ICD-10-CM | POA: Diagnosis not present

## 2018-04-27 DIAGNOSIS — M25661 Stiffness of right knee, not elsewhere classified: Secondary | ICD-10-CM | POA: Diagnosis not present

## 2018-04-27 DIAGNOSIS — M6281 Muscle weakness (generalized): Secondary | ICD-10-CM | POA: Diagnosis not present

## 2018-04-27 DIAGNOSIS — M25562 Pain in left knee: Secondary | ICD-10-CM | POA: Diagnosis not present

## 2018-04-27 DIAGNOSIS — R2689 Other abnormalities of gait and mobility: Secondary | ICD-10-CM | POA: Diagnosis not present

## 2018-04-29 DIAGNOSIS — Z96651 Presence of right artificial knee joint: Secondary | ICD-10-CM | POA: Diagnosis not present

## 2018-04-29 DIAGNOSIS — Z471 Aftercare following joint replacement surgery: Secondary | ICD-10-CM | POA: Diagnosis not present

## 2018-04-29 DIAGNOSIS — R2689 Other abnormalities of gait and mobility: Secondary | ICD-10-CM | POA: Diagnosis not present

## 2018-04-29 DIAGNOSIS — M25562 Pain in left knee: Secondary | ICD-10-CM | POA: Diagnosis not present

## 2018-04-29 DIAGNOSIS — M25661 Stiffness of right knee, not elsewhere classified: Secondary | ICD-10-CM | POA: Diagnosis not present

## 2018-04-29 DIAGNOSIS — M6281 Muscle weakness (generalized): Secondary | ICD-10-CM | POA: Diagnosis not present

## 2018-05-01 DIAGNOSIS — Z471 Aftercare following joint replacement surgery: Secondary | ICD-10-CM | POA: Diagnosis not present

## 2018-05-01 DIAGNOSIS — M6281 Muscle weakness (generalized): Secondary | ICD-10-CM | POA: Diagnosis not present

## 2018-05-01 DIAGNOSIS — Z96651 Presence of right artificial knee joint: Secondary | ICD-10-CM | POA: Diagnosis not present

## 2018-05-01 DIAGNOSIS — M25661 Stiffness of right knee, not elsewhere classified: Secondary | ICD-10-CM | POA: Diagnosis not present

## 2018-05-01 DIAGNOSIS — M25562 Pain in left knee: Secondary | ICD-10-CM | POA: Diagnosis not present

## 2018-05-01 DIAGNOSIS — R2689 Other abnormalities of gait and mobility: Secondary | ICD-10-CM | POA: Diagnosis not present

## 2018-05-05 DIAGNOSIS — R2689 Other abnormalities of gait and mobility: Secondary | ICD-10-CM | POA: Diagnosis not present

## 2018-05-05 DIAGNOSIS — Z471 Aftercare following joint replacement surgery: Secondary | ICD-10-CM | POA: Diagnosis not present

## 2018-05-05 DIAGNOSIS — M25661 Stiffness of right knee, not elsewhere classified: Secondary | ICD-10-CM | POA: Diagnosis not present

## 2018-05-05 DIAGNOSIS — M6281 Muscle weakness (generalized): Secondary | ICD-10-CM | POA: Diagnosis not present

## 2018-05-05 DIAGNOSIS — Z96651 Presence of right artificial knee joint: Secondary | ICD-10-CM | POA: Diagnosis not present

## 2018-05-05 DIAGNOSIS — M25562 Pain in left knee: Secondary | ICD-10-CM | POA: Diagnosis not present

## 2018-05-07 DIAGNOSIS — M25562 Pain in left knee: Secondary | ICD-10-CM | POA: Diagnosis not present

## 2018-05-07 DIAGNOSIS — M25661 Stiffness of right knee, not elsewhere classified: Secondary | ICD-10-CM | POA: Diagnosis not present

## 2018-05-07 DIAGNOSIS — Z471 Aftercare following joint replacement surgery: Secondary | ICD-10-CM | POA: Diagnosis not present

## 2018-05-07 DIAGNOSIS — Z96651 Presence of right artificial knee joint: Secondary | ICD-10-CM | POA: Diagnosis not present

## 2018-05-07 DIAGNOSIS — M6281 Muscle weakness (generalized): Secondary | ICD-10-CM | POA: Diagnosis not present

## 2018-05-07 DIAGNOSIS — R2689 Other abnormalities of gait and mobility: Secondary | ICD-10-CM | POA: Diagnosis not present

## 2018-05-08 DIAGNOSIS — Z471 Aftercare following joint replacement surgery: Secondary | ICD-10-CM | POA: Diagnosis not present

## 2018-05-08 DIAGNOSIS — Z96651 Presence of right artificial knee joint: Secondary | ICD-10-CM | POA: Diagnosis not present

## 2018-05-08 DIAGNOSIS — R2689 Other abnormalities of gait and mobility: Secondary | ICD-10-CM | POA: Diagnosis not present

## 2018-05-08 DIAGNOSIS — M25562 Pain in left knee: Secondary | ICD-10-CM | POA: Diagnosis not present

## 2018-05-08 DIAGNOSIS — M25661 Stiffness of right knee, not elsewhere classified: Secondary | ICD-10-CM | POA: Diagnosis not present

## 2018-05-08 DIAGNOSIS — M6281 Muscle weakness (generalized): Secondary | ICD-10-CM | POA: Diagnosis not present

## 2018-05-11 DIAGNOSIS — R2689 Other abnormalities of gait and mobility: Secondary | ICD-10-CM | POA: Diagnosis not present

## 2018-05-11 DIAGNOSIS — Z471 Aftercare following joint replacement surgery: Secondary | ICD-10-CM | POA: Diagnosis not present

## 2018-05-11 DIAGNOSIS — Z96651 Presence of right artificial knee joint: Secondary | ICD-10-CM | POA: Diagnosis not present

## 2018-05-11 DIAGNOSIS — M25562 Pain in left knee: Secondary | ICD-10-CM | POA: Diagnosis not present

## 2018-05-11 DIAGNOSIS — M25661 Stiffness of right knee, not elsewhere classified: Secondary | ICD-10-CM | POA: Diagnosis not present

## 2018-05-11 DIAGNOSIS — M6281 Muscle weakness (generalized): Secondary | ICD-10-CM | POA: Diagnosis not present

## 2018-05-12 ENCOUNTER — Ambulatory Visit (INDEPENDENT_AMBULATORY_CARE_PROVIDER_SITE_OTHER): Payer: Medicare Other

## 2018-05-12 ENCOUNTER — Ambulatory Visit (INDEPENDENT_AMBULATORY_CARE_PROVIDER_SITE_OTHER): Payer: Medicare Other | Admitting: Orthopaedic Surgery

## 2018-05-12 ENCOUNTER — Encounter (INDEPENDENT_AMBULATORY_CARE_PROVIDER_SITE_OTHER): Payer: Self-pay | Admitting: Orthopaedic Surgery

## 2018-05-12 DIAGNOSIS — Z96651 Presence of right artificial knee joint: Secondary | ICD-10-CM | POA: Diagnosis not present

## 2018-05-12 NOTE — Progress Notes (Signed)
   Post-Op Visit Note   Patient: Nicholas Huffman           Date of Birth: [birth year]           MRN: [id number] Visit Date: 05/12/2018 PCP: Susy Frizzle, MD   Assessment & Plan:  Chief Complaint:  Chief Complaint  Patient presents with  . Right Knee - Pain   Visit Diagnoses:  1. History of total knee replacement, right     Plan: 6 week TKA follow up plan  Patient presents for follow up 6 weeks status post right total knee replacement. The wound is healed and there is no evidence of infection. TED hose may be discontinued. Radiographs reveal a total knee arthroplasty in good position, with no evidence of subsidence, loosening, or complicating features. Patient should refrain from any dental procedures, colonoscopies, etc until 3 months postop.  Reminders were given about signs to be aware of including redness, drainage, increased pain, fevers, calf pain, shortness of breath, or any concern should generate a phone call or a return to see [place] immediately. Will plan to follow up at 3 months postoperatively for next evaluation.   Follow-Up Instructions: Return in about 6 weeks (around 06/23/2018).   Orders:  Orders Placed This Encounter  Procedures  . XR KNEE 3 VIEW RIGHT   No orders of the defined types were placed in this encounter.   Imaging: Xr Knee 3 View Right  Result Date: 05/12/2018 Stable total knee replacement in good alignment.   PMFS History: Patient Active Problem List   Diagnosis Date Noted  . History of total knee replacement, right 03/26/2018  . Primary localized osteoarthritis of right knee 02/17/2018  . Hyperlipidemia   . Hypertension   . Smoker    Past Medical History:  Diagnosis Date  . Cancer ([place])    basal cell  . Cataract   . Fractured skull ([place])    as a child  . Hyperlipidemia   . Hypertension   . Primary localized osteoarthritis of right knee   . Smoker   . Wears glasses   . Wears hearing aid in both ears     Family History  Problem  Relation Age of Onset  . Breast cancer Mother   . Colon cancer Neg Hx     Past Surgical History:  Procedure Laterality Date  . BASAL CELL CARCINOMA EXCISION  2016   face  . CATARACT EXTRACTION W/ INTRAOCULAR LENS  IMPLANT, BILATERAL    . COLONOSCOPY  2008, 2002   hx polyps/Dr. Fuller Plan  . EYE SURGERY     retina detachment repair x 2  . KNEE ARTHROSCOPY  April/2013   left  . TOTAL KNEE ARTHROPLASTY Right 03/26/2018   Procedure: RIGHT TOTAL KNEE ARTHROPLASTY;  Surgeon: Leandrew Koyanagi, MD;  Location: [facility];  Service: Orthopedics;  Laterality: Right;   Social History   Occupational History  . Not on file  Tobacco Use  . Smoking status: Current Every Day Smoker    Packs/day: 1.00    Types: Cigarettes  . Smokeless tobacco: Never Used  . Tobacco comment: pt to call PCP for patch  Substance and Sexual Activity  . Alcohol use: No  . Drug use: No  . Sexual activity: Not on file

## 2018-05-25 ENCOUNTER — Other Ambulatory Visit: Payer: Self-pay | Admitting: Orthopaedic Surgery

## 2018-06-09 ENCOUNTER — Other Ambulatory Visit (INDEPENDENT_AMBULATORY_CARE_PROVIDER_SITE_OTHER): Payer: Self-pay | Admitting: Physician Assistant

## 2018-06-09 MED ORDER — AMOXICILLIN 500 MG PO CAPS: ORAL_CAPSULE | ORAL | 1 refills | Status: DC

## 2018-06-22 DIAGNOSIS — H20012 Primary iridocyclitis, left eye: Secondary | ICD-10-CM | POA: Diagnosis not present

## 2018-06-23 ENCOUNTER — Ambulatory Visit (INDEPENDENT_AMBULATORY_CARE_PROVIDER_SITE_OTHER): Payer: Medicare Other | Admitting: Orthopaedic Surgery

## 2018-06-23 ENCOUNTER — Encounter (INDEPENDENT_AMBULATORY_CARE_PROVIDER_SITE_OTHER): Payer: Self-pay | Admitting: Orthopaedic Surgery

## 2018-06-23 VITALS — Ht 66.0 in | Wt 178.0 lb

## 2018-06-23 DIAGNOSIS — Z96651 Presence of right artificial knee joint: Secondary | ICD-10-CM

## 2018-06-23 NOTE — Progress Notes (Signed)
3 month TKA follow up plan  Patient now 3 months status post right total knee arthroplasty. Wound is healed with no signs of complications or infection.  The patient does not complain of pain, and is back to normal daily activities. It was reinforced that prophylactic antibiotics should be taken with any procedure including but not limited to dental work or colonoscopies.  We will plan on following up at the 6 month postop visit with 2 view xrays of the operative knee at that time. As always, instructions were given to call with any questions or concerns in the interim.

## 2018-06-25 DIAGNOSIS — H00024 Hordeolum internum left upper eyelid: Secondary | ICD-10-CM | POA: Diagnosis not present

## 2018-07-10 DIAGNOSIS — H0014 Chalazion left upper eyelid: Secondary | ICD-10-CM | POA: Diagnosis not present

## 2018-08-18 DIAGNOSIS — Z9849 Cataract extraction status, unspecified eye: Secondary | ICD-10-CM | POA: Diagnosis not present

## 2018-08-18 DIAGNOSIS — Z961 Presence of intraocular lens: Secondary | ICD-10-CM | POA: Diagnosis not present

## 2018-08-18 DIAGNOSIS — H59811 Chorioretinal scars after surgery for detachment, right eye: Secondary | ICD-10-CM | POA: Diagnosis not present

## 2018-09-30 DIAGNOSIS — Z23 Encounter for immunization: Secondary | ICD-10-CM | POA: Diagnosis not present

## 2018-11-26 IMAGING — DX DG KNEE COMPLETE 4+V*R*
4 series · 4 of 4 positions shown · non-contrast
Comparison: MR right knee of 11/14/2012

CLINICAL DATA: Chronic right knee pain, no acute injury

EXAM:
RIGHT KNEE - COMPLETE 4+ VIEW

[dg knee complete 4 views right (1 of 4)]
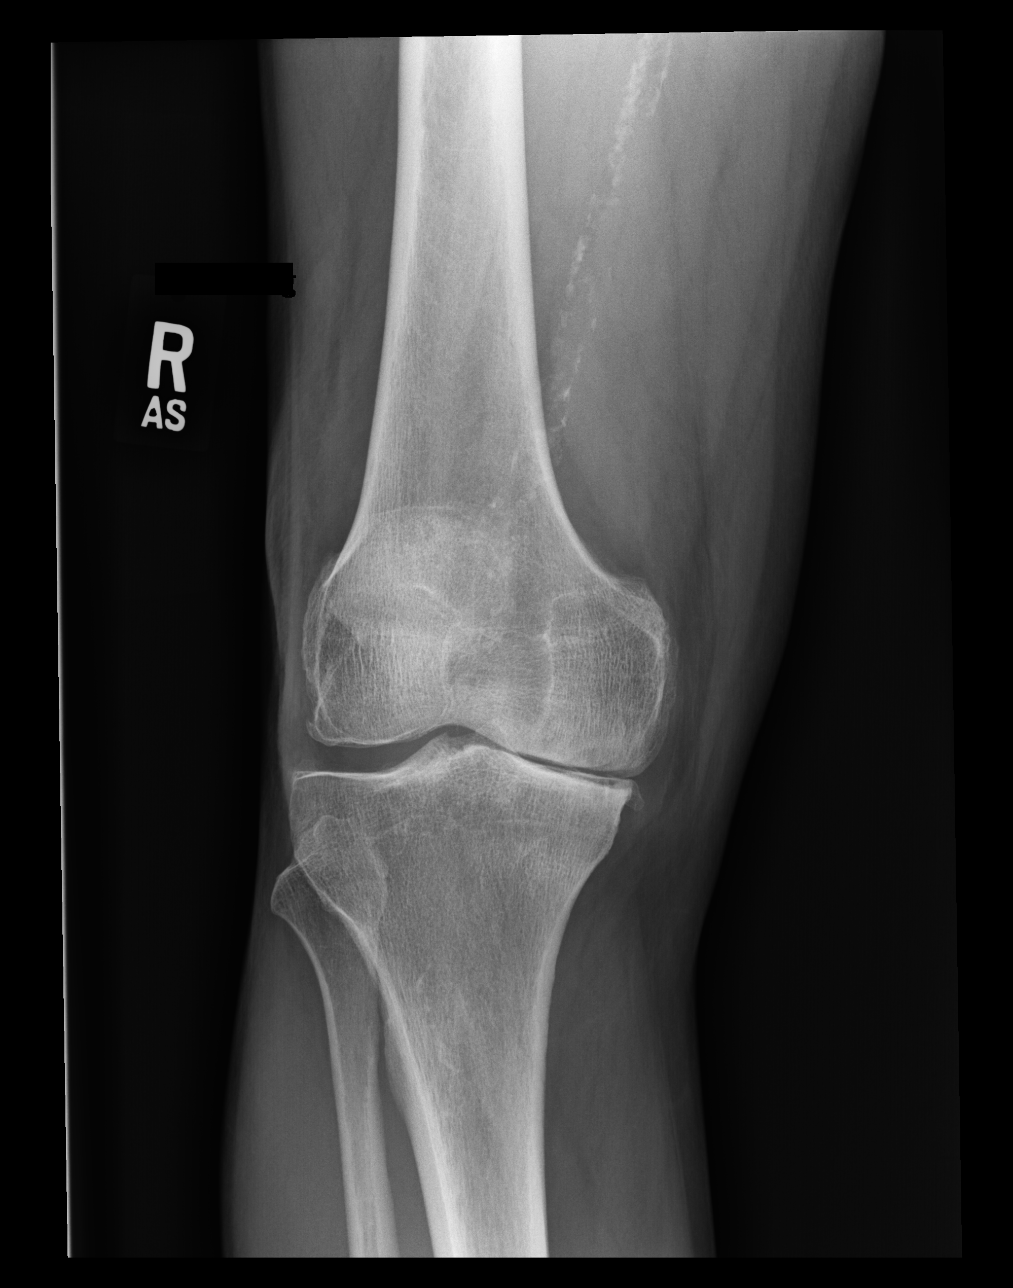

[dg knee complete 4 views right (2 of 4)]
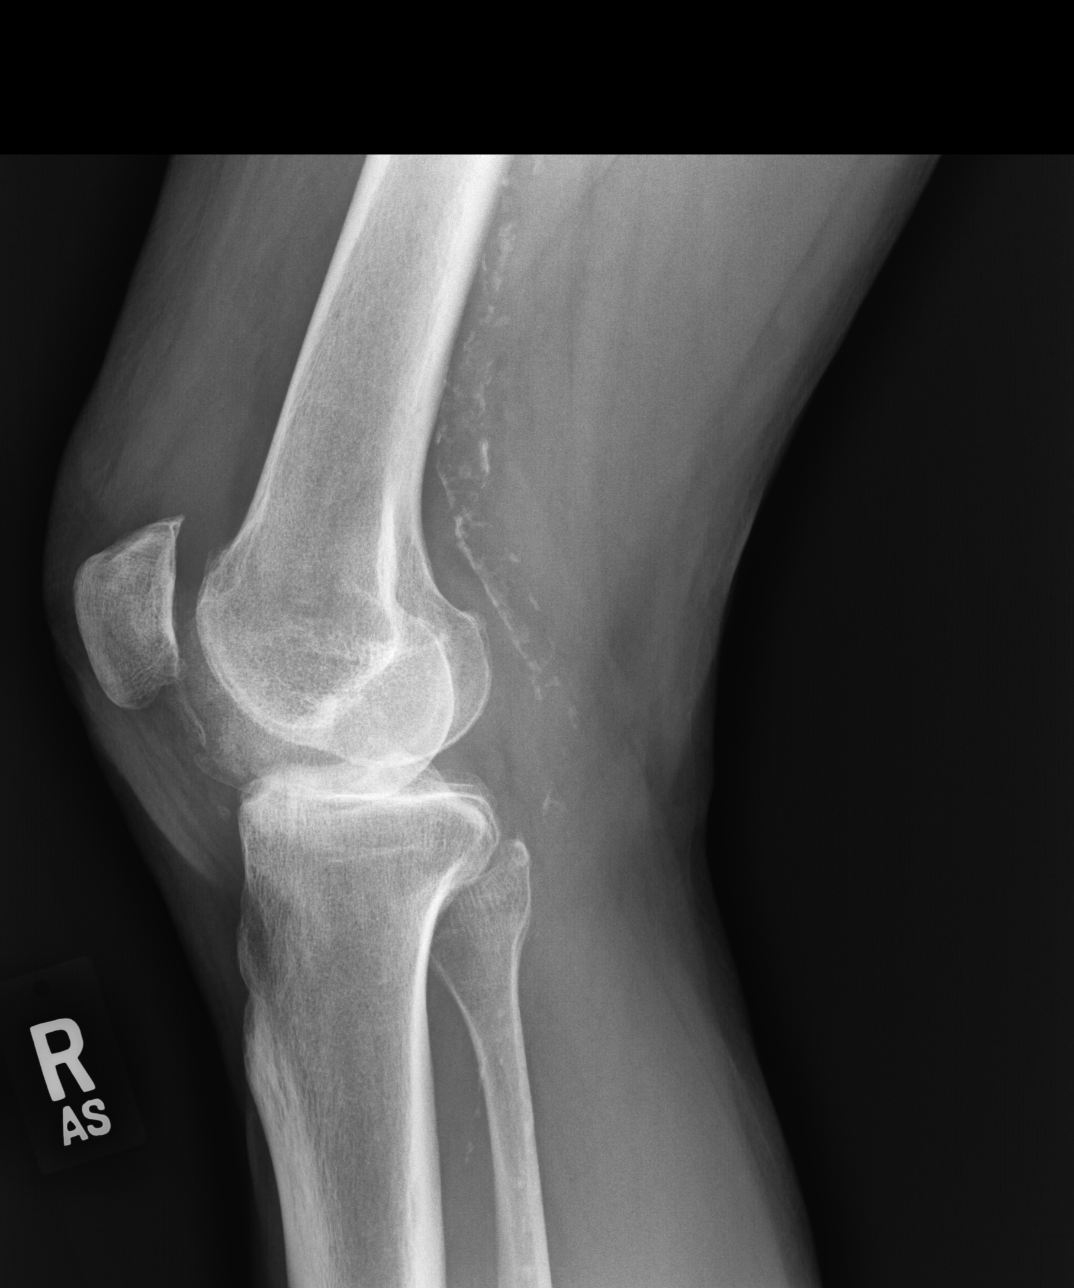

[dg knee complete 4 views right (3 of 4)]
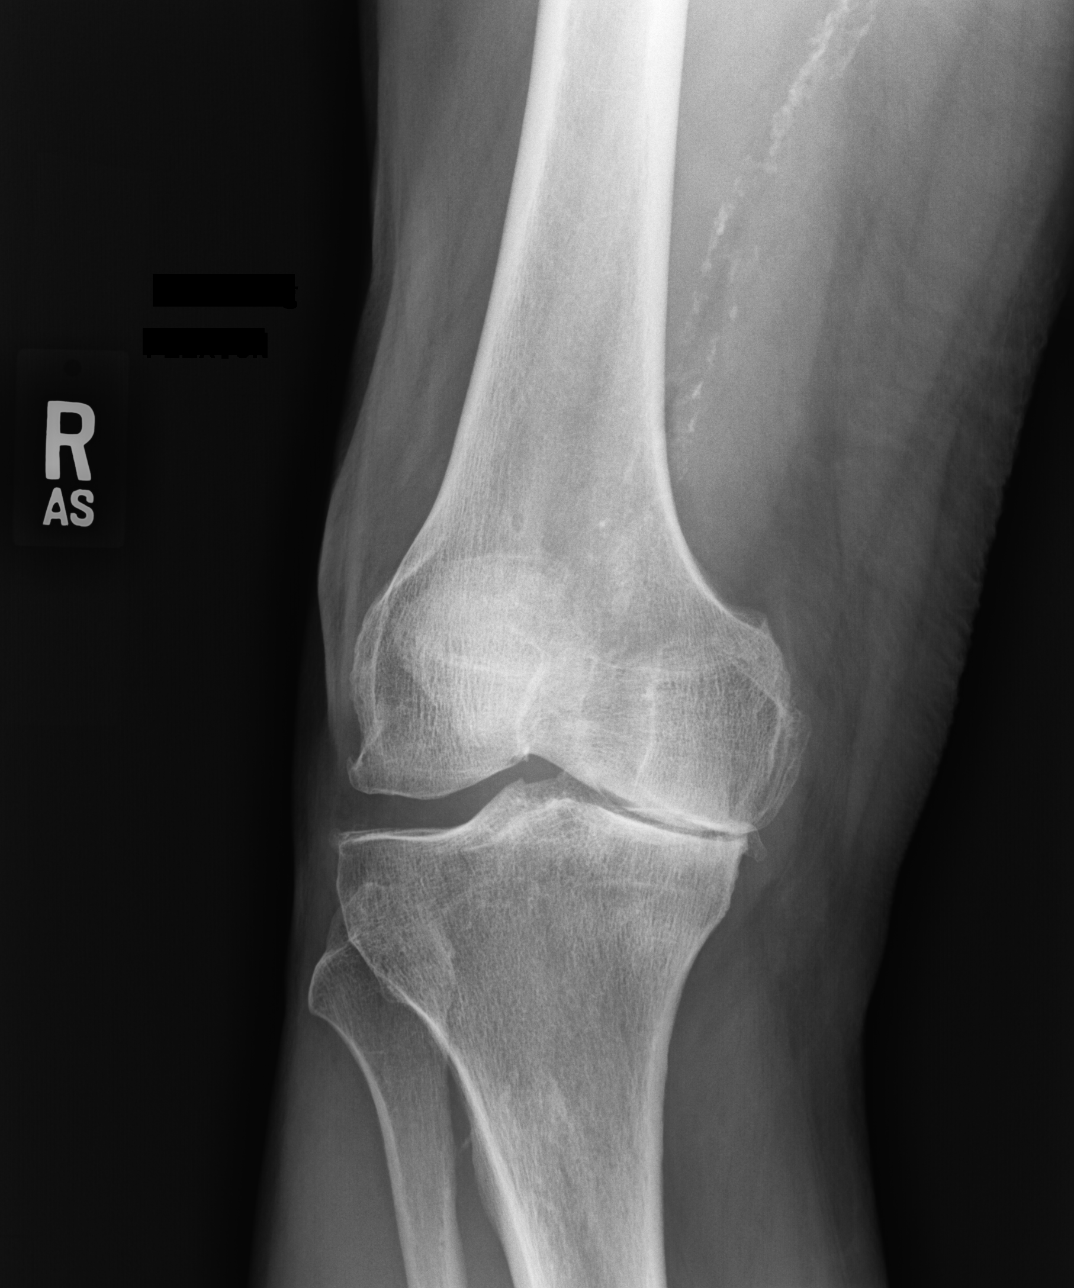

[dg knee complete 4 views right (4 of 4)]
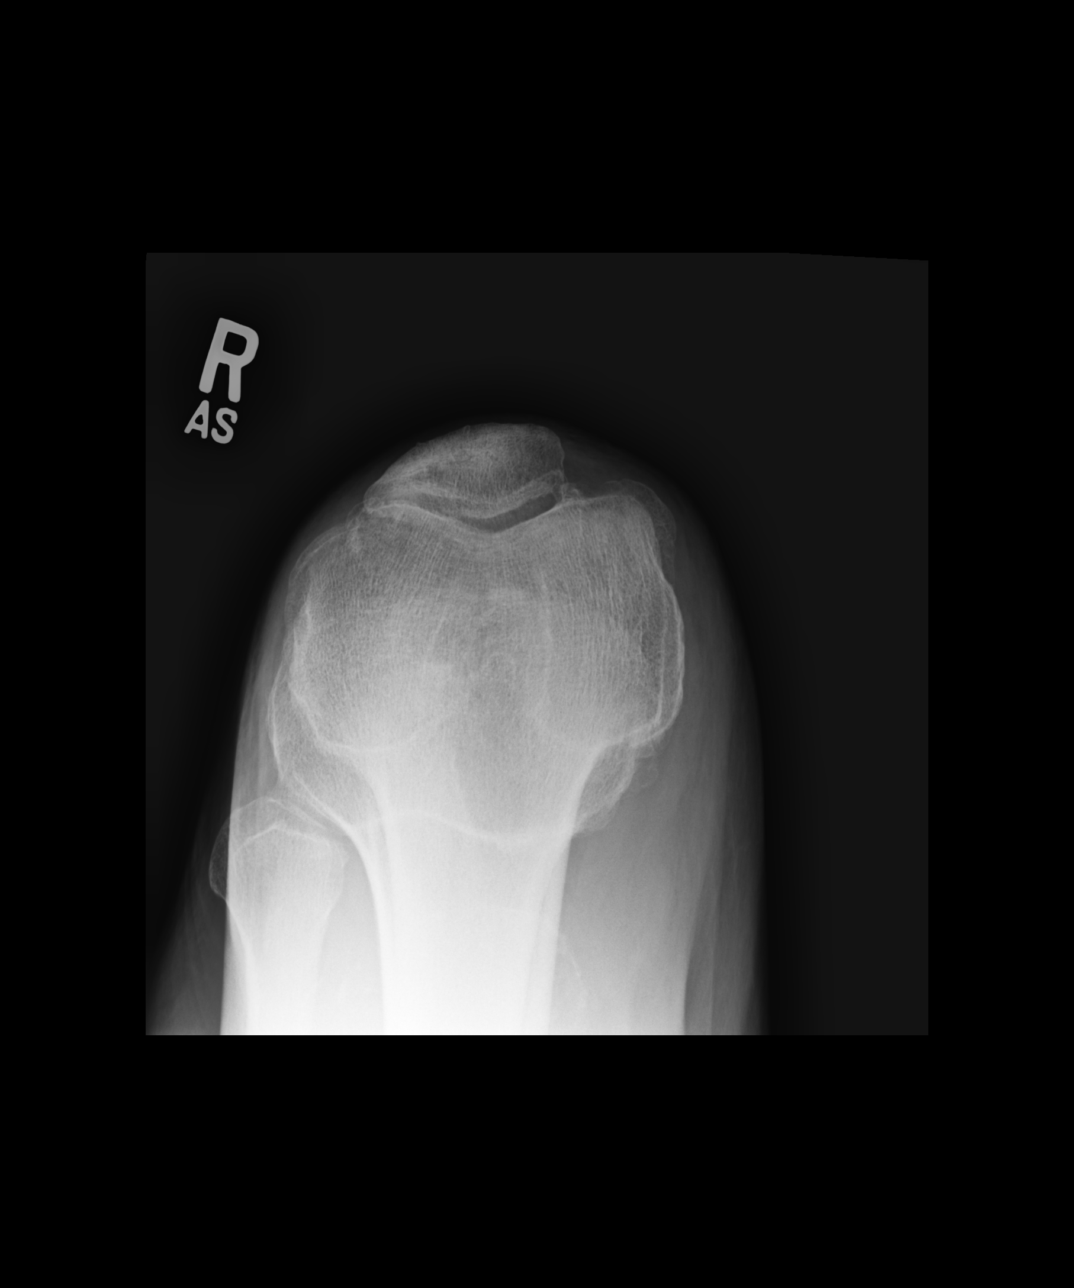

[4 of 4 positions shown; findings below may reference images not displayed]

FINDINGS: There is tricompartmental degenerative joint disease the right knee
primarily involving the medial compartment where there is almost
complete loss of joint space and sclerosis with spurring. No acute
fracture is seen. There may be a small right knee joint effusion
present. Calcifications of the distal superficial femoral artery and
popliteal artery are noted.
IMPRESSION: 1. Tricompartmental degenerative joint disease primarily involving
the medial compartment with almost complete loss of joint space.
2. Probable small amount of right knee joint fluid.
3. Arterial calcifications.

## 2018-12-29 ENCOUNTER — Ambulatory Visit (INDEPENDENT_AMBULATORY_CARE_PROVIDER_SITE_OTHER): Payer: Medicare Other | Admitting: Family Medicine

## 2018-12-29 ENCOUNTER — Encounter: Payer: Self-pay | Admitting: Family Medicine

## 2018-12-29 VITALS — BP 110/60 | HR 76 | Temp 98.0°F | Resp 16 | Ht 66.0 in | Wt 175.0 lb

## 2018-12-29 DIAGNOSIS — L858 Other specified epidermal thickening: Secondary | ICD-10-CM

## 2018-12-29 DIAGNOSIS — E7800 Pure hypercholesterolemia, unspecified: Secondary | ICD-10-CM | POA: Diagnosis not present

## 2018-12-29 DIAGNOSIS — Z125 Encounter for screening for malignant neoplasm of prostate: Secondary | ICD-10-CM | POA: Diagnosis not present

## 2018-12-29 DIAGNOSIS — I10 Essential (primary) hypertension: Secondary | ICD-10-CM | POA: Diagnosis not present

## 2018-12-29 DIAGNOSIS — Z1159 Encounter for screening for other viral diseases: Secondary | ICD-10-CM

## 2018-12-29 NOTE — Progress Notes (Signed)
Subjective:    Patient ID: Nicholas Huffman, male    DOB: March 12, 1946, 73 y.o.   MRN: [id number]  Patient is here today for several issues.  He has a history of hypertension and hyperlipidemia.  He is currently taking his amlodipine as well as losartan hydrochlorothiazide.  He denies any chest pain shortness of breath or dyspnea on exertion.  His blood pressures well controlled at 110/60.  He is also on atorvastatin for his hyperlipidemia.  He denies any myalgias or right upper quadrant pain.  He is due for a fasting lipid panel.  He is also due for hepatitis C screening as well as prostate cancer screening.  The patient would like to return fasting for this.  However his primary concern is a lesion that has developed on the right side of the tip of his nose.  The lesion appears to be a cutaneous horn.  Is approximately 4 mm in diameter.  It is oblong shaped brown and verruciform appearance rising to a point.  Differential diagnosis includes squamous cell carcinoma versus a cutaneous horn versus an abnormally shaped seborrheic keratosis.  Patient was offered shave biopsy versus empiric therapy with cryotherapy.  He elects to try the cryotherapy and then return in 3 weeks if the lesion persists for a biopsy. Past Medical History:  Diagnosis Date  . Cancer ([place])    basal cell  . Cataract   . Fractured skull ([place])    as a child  . Hyperlipidemia   . Hypertension   . Primary localized osteoarthritis of right knee   . Smoker   . Wears glasses   . Wears hearing aid in both ears    Past Surgical History:  Procedure Laterality Date  . BASAL CELL CARCINOMA EXCISION  2016   face  . CATARACT EXTRACTION W/ INTRAOCULAR LENS  IMPLANT, BILATERAL    . COLONOSCOPY  2008, 2002   hx polyps/Dr. Fuller Plan  . EYE SURGERY     retina detachment repair x 2  . KNEE ARTHROSCOPY  April/2013   left  . TOTAL KNEE ARTHROPLASTY Right 03/26/2018   Procedure: RIGHT TOTAL KNEE ARTHROPLASTY;  Surgeon: Leandrew Koyanagi, MD;   Location: [facility];  Service: Orthopedics;  Laterality: Right;   Current Outpatient Medications on File Prior to Visit  Medication Sig Dispense Refill  . amLODipine (NORVASC) 10 MG tablet TAKE 1 TABLET (10 MG TOTAL) BY MOUTH DAILY. 90 tablet 3  . aspirin EC 81 MG tablet Take 1 tablet (81 mg total) by mouth 2 (two) times daily. (Patient taking differently: Take 81 mg by mouth daily. ) 84 tablet 0  . atorvastatin (LIPITOR) 40 MG tablet TAKE 1 TABLET BY MOUTH ONCE DAILY AT 6PM 90 tablet 3  . losartan-hydrochlorothiazide (HYZAAR) 100-25 MG tablet TAKE 1 TABLET BY MOUTH DAILY. 90 tablet 3   Current Facility-Administered Medications on File Prior to Visit  Medication Dose Route Frequency Provider Last Rate Last Dose  . 0.9 %  sodium chloride infusion  500 mL Intravenous Continuous Ladene Artist, MD       No Known Allergies Social History   Socioeconomic History  . Marital status: Married    Spouse name: Not on file  . Number of children: Not on file  . Years of education: Not on file  . Highest education level: Not on file  Occupational History  . Not on file  Social Needs  . Financial resource strain: Not on file  . Food insecurity:  Worry: Not on file    Inability: Not on file  . Transportation needs:    Medical: Not on file    Non-medical: Not on file  Tobacco Use  . Smoking status: Current Every Day Smoker    Packs/day: 1.00    Types: Cigarettes  . Smokeless tobacco: Never Used  . Tobacco comment: pt to call PCP for patch  Substance and Sexual Activity  . Alcohol use: No  . Drug use: No  . Sexual activity: Not on file  Lifestyle  . Physical activity:    Days per week: Not on file    Minutes per session: Not on file  . Stress: Not on file  Relationships  . Social connections:    Talks on phone: Not on file    Gets together: Not on file    Attends religious service: Not on file    Active member of club or organization: Not on file    Attends meetings of clubs or  organizations: Not on file    Relationship status: Not on file  . Intimate partner violence:    Fear of current or ex partner: Not on file    Emotionally abused: Not on file    Physically abused: Not on file    Forced sexual activity: Not on file  Other Topics Concern  . Not on file  Social History Narrative  . Not on file    Past Medical History:  Diagnosis Date  . Cancer ([place])    basal cell  . Cataract   . Fractured skull ([place])    as a child  . Hyperlipidemia   . Hypertension   . Primary localized osteoarthritis of right knee   . Smoker   . Wears glasses   . Wears hearing aid in both ears    Past Surgical History:  Procedure Laterality Date  . BASAL CELL CARCINOMA EXCISION  2016   face  . CATARACT EXTRACTION W/ INTRAOCULAR LENS  IMPLANT, BILATERAL    . COLONOSCOPY  2008, 2002   hx polyps/Dr. Fuller Plan  . EYE SURGERY     retina detachment repair x 2  . KNEE ARTHROSCOPY  April/2013   left  . TOTAL KNEE ARTHROPLASTY Right 03/26/2018   Procedure: RIGHT TOTAL KNEE ARTHROPLASTY;  Surgeon: Leandrew Koyanagi, MD;  Location: [facility];  Service: Orthopedics;  Laterality: Right;   Current Outpatient Medications on File Prior to Visit  Medication Sig Dispense Refill  . amLODipine (NORVASC) 10 MG tablet TAKE 1 TABLET (10 MG TOTAL) BY MOUTH DAILY. 90 tablet 3  . aspirin EC 81 MG tablet Take 1 tablet (81 mg total) by mouth 2 (two) times daily. (Patient taking differently: Take 81 mg by mouth daily. ) 84 tablet 0  . atorvastatin (LIPITOR) 40 MG tablet TAKE 1 TABLET BY MOUTH ONCE DAILY AT 6PM 90 tablet 3  . losartan-hydrochlorothiazide (HYZAAR) 100-25 MG tablet TAKE 1 TABLET BY MOUTH DAILY. 90 tablet 3   Current Facility-Administered Medications on File Prior to Visit  Medication Dose Route Frequency Provider Last Rate Last Dose  . 0.9 %  sodium chloride infusion  500 mL Intravenous Continuous Ladene Artist, MD       No Known Allergies Social History   Socioeconomic History  . Marital  status: Married    Spouse name: Not on file  . Number of children: Not on file  . Years of education: Not on file  . Highest education level: Not on file  Occupational History  .  Not on file  Social Needs  . Financial resource strain: Not on file  . Food insecurity:    Worry: Not on file    Inability: Not on file  . Transportation needs:    Medical: Not on file    Non-medical: Not on file  Tobacco Use  . Smoking status: Current Every Day Smoker    Packs/day: 1.00    Types: Cigarettes  . Smokeless tobacco: Never Used  . Tobacco comment: pt to call PCP for patch  Substance and Sexual Activity  . Alcohol use: No  . Drug use: No  . Sexual activity: Not on file  Lifestyle  . Physical activity:    Days per week: Not on file    Minutes per session: Not on file  . Stress: Not on file  Relationships  . Social connections:    Talks on phone: Not on file    Gets together: Not on file    Attends religious service: Not on file    Active member of club or organization: Not on file    Attends meetings of clubs or organizations: Not on file    Relationship status: Not on file  . Intimate partner violence:    Fear of current or ex partner: Not on file    Emotionally abused: Not on file    Physically abused: Not on file    Forced sexual activity: Not on file  Other Topics Concern  . Not on file  Social History Narrative  . Not on file      Review of Systems  All other systems reviewed and are negative.      Objective:   Physical Exam  Constitutional: He appears well-developed and well-nourished. No distress.  HENT:  Head: Normocephalic and atraumatic.  Right Ear: External ear normal.  Left Ear: External ear normal.  Nose: Nose normal.    Mouth/Throat: Oropharynx is clear and moist. No oropharyngeal exudate.  Eyes: Conjunctivae are normal.  Neck: Neck supple.  Cardiovascular: Normal rate, regular rhythm and normal heart sounds.  No murmur heard. Pulmonary/Chest:  Effort normal and breath sounds normal. No respiratory distress. He has no wheezes. He has no rales.  Abdominal: Soft. Bowel sounds are normal. He exhibits no distension. There is no abdominal tenderness. There is no rebound.  Musculoskeletal:        General: No edema.  Lymphadenopathy:    He has no cervical adenopathy.  Skin: No rash noted. He is not diaphoretic. No erythema.  Vitals reviewed.         Assessment & Plan:  Cutaneous horn  Benign essential HTN  Pure hypercholesterolemia - Plan: CBC with Differential/Platelet, COMPLETE METABOLIC PANEL WITH GFR, Lipid panel  Prostate cancer screening - Plan: PSA  Encounter for hepatitis C screening test for low risk patient - Plan: Hepatitis C Antibody   Blood pressures well controlled.  Return fasting for CBC, CMP, fasting lipid panel.  Goal LDL cholesterol is less than 100 and ideally is close to 70 as possible.  Patient has had his flu shot.  I recommended a PSA for prostate cancer screening as well as a blood work to evaluate for hepatitis C.  We will get this with his labs tomorrow.  We discussed options including a shave biopsy versus cryotherapy.  He elects to try cryotherapy and if the lesion persist then return for shave biopsy.  The lesion on his nose was treated with liquid nitrogen cryotherapy for a total of 30 seconds.  The  patient will return in 3 weeks if the lesion persist for a shave biopsy.

## 2018-12-30 ENCOUNTER — Other Ambulatory Visit: Payer: Medicare Other

## 2018-12-30 DIAGNOSIS — Z1159 Encounter for screening for other viral diseases: Secondary | ICD-10-CM | POA: Diagnosis not present

## 2018-12-30 DIAGNOSIS — E7800 Pure hypercholesterolemia, unspecified: Secondary | ICD-10-CM | POA: Diagnosis not present

## 2018-12-30 DIAGNOSIS — Z125 Encounter for screening for malignant neoplasm of prostate: Secondary | ICD-10-CM | POA: Diagnosis not present

## 2018-12-31 ENCOUNTER — Encounter: Payer: Self-pay | Admitting: Family Medicine

## 2018-12-31 LAB — LIPID PANEL
CHOLESTEROL: 97 mg/dL (ref <200)
HDL: 42 mg/dL (ref >40)
LDL Cholesterol (Calc): 39 mg/dL (calc)
NON-HDL CHOLESTEROL (CALC): 55 mg/dL (ref <130)
Total CHOL/HDL Ratio: 2.3 (calc) (ref <5.0)
Triglycerides: 75 mg/dL (ref <150)

## 2018-12-31 LAB — CBC WITH DIFFERENTIAL/PLATELET
ABSOLUTE MONOCYTES: 501 {cells}/uL (ref 200–950)
BASOS PCT: 1.6 %
Basophils Absolute: 123 cells/uL (ref 0–200)
Eosinophils Absolute: 262 cells/uL (ref 15–500)
Eosinophils Relative: 3.4 %
HEMATOCRIT: 47.9 % (ref 38.5–50.0)
Hemoglobin: 16.5 g/dL (ref 13.2–17.1)
Lymphs Abs: 1594 cells/uL (ref 850–3900)
MCH: 31.3 pg (ref 27.0–33.0)
MCHC: 34.4 g/dL (ref 32.0–36.0)
MCV: 90.9 fL (ref 80.0–100.0)
MPV: 10.3 fL (ref 7.5–12.5)
Monocytes Relative: 6.5 %
Neutro Abs: 5221 cells/uL (ref 1500–7800)
Neutrophils Relative %: 67.8 %
PLATELETS: 305 10*3/uL (ref 140–400)
RBC: 5.27 10*6/uL (ref 4.20–5.80)
RDW: 12.7 % (ref 11.0–15.0)
Total Lymphocyte: 20.7 %
WBC: 7.7 10*3/uL (ref 3.8–10.8)

## 2018-12-31 LAB — HEPATITIS C ANTIBODY
Hepatitis C Ab: NONREACTIVE
SIGNAL TO CUT-OFF: 0.01 (ref <1.00)

## 2018-12-31 LAB — COMPLETE METABOLIC PANEL WITHOUT GFR
AG Ratio: 1.8 (calc) (ref 1.0–2.5)
ALT: 19 U/L (ref 9–46)
AST: 18 U/L (ref 10–35)
Albumin: 4.2 g/dL (ref 3.6–5.1)
Alkaline phosphatase (APISO): 67 U/L (ref 40–115)
BUN/Creatinine Ratio: 17 (calc) (ref 6–22)
BUN: 21 mg/dL (ref 7–25)
CO2: 25 mmol/L (ref 20–32)
Calcium: 9.9 mg/dL (ref 8.6–10.3)
Chloride: 103 mmol/L (ref 98–110)
Creat: 1.25 mg/dL — ABNORMAL HIGH (ref 0.70–1.18)
GFR, Est African American: 66 mL/min/1.73m2
GFR, Est Non African American: 57 mL/min/1.73m2 — ABNORMAL LOW
Globulin: 2.4 g/dL (ref 1.9–3.7)
Glucose, Bld: 93 mg/dL (ref 65–99)
Potassium: 4.4 mmol/L (ref 3.5–5.3)
Sodium: 140 mmol/L (ref 135–146)
Total Bilirubin: 0.7 mg/dL (ref 0.2–1.2)
Total Protein: 6.6 g/dL (ref 6.1–8.1)

## 2018-12-31 LAB — PSA: PSA: 3.3 ng/mL (ref <=4.0)

## 2019-01-07 ENCOUNTER — Other Ambulatory Visit: Payer: Self-pay | Admitting: Orthopaedic Surgery

## 2019-03-15 ENCOUNTER — Other Ambulatory Visit: Payer: Self-pay

## 2019-03-15 MED ORDER — ATORVASTATIN CALCIUM 40 MG PO TABS: 40.0000 mg | ORAL_TABLET | Freq: Every day | ORAL | 3 refills | Status: DC

## 2019-03-15 MED ORDER — AMLODIPINE BESYLATE 10 MG PO TABS: 10.0000 mg | ORAL_TABLET | Freq: Every day | ORAL | 3 refills | Status: DC

## 2019-03-18 ENCOUNTER — Other Ambulatory Visit: Payer: Self-pay | Admitting: Family Medicine

## 2019-03-25 ENCOUNTER — Ambulatory Visit (INDEPENDENT_AMBULATORY_CARE_PROVIDER_SITE_OTHER): Payer: Medicare Other | Admitting: Orthopaedic Surgery

## 2019-07-08 ENCOUNTER — Other Ambulatory Visit: Payer: Self-pay

## 2019-07-28 DIAGNOSIS — Z23 Encounter for immunization: Secondary | ICD-10-CM | POA: Diagnosis not present

## 2019-11-01 ENCOUNTER — Other Ambulatory Visit: Payer: Self-pay

## 2019-11-01 DIAGNOSIS — Z20828 Contact with and (suspected) exposure to other viral communicable diseases: Secondary | ICD-10-CM | POA: Diagnosis not present

## 2019-11-01 DIAGNOSIS — Z20822 Contact with and (suspected) exposure to covid-19: Secondary | ICD-10-CM

## 2019-11-02 LAB — NOVEL CORONAVIRUS, NAA: SARS-CoV-2, NAA: NOT DETECTED

## 2019-11-03 ENCOUNTER — Other Ambulatory Visit: Payer: Self-pay

## 2019-12-28 ENCOUNTER — Ambulatory Visit: Payer: Medicare Other | Attending: Internal Medicine

## 2019-12-28 DIAGNOSIS — Z23 Encounter for immunization: Secondary | ICD-10-CM | POA: Insufficient documentation

## 2019-12-28 NOTE — Progress Notes (Signed)
   Covid-19 Vaccination Clinic  Name:  Nicholas Huffman    MRN: [id number] DOB: [birth year]  12/28/2019  [name] was observed post Covid-19 immunization for 15 minutes without incidence. He was provided with Vaccine Information Sheet and instruction to access the V-Safe system.   [name] was instructed to call 911 with any severe reactions post vaccine: Marland Kitchen Difficulty breathing  . Swelling of your face and throat  . A fast heartbeat  . A bad rash all over your body  . Dizziness and weakness    Immunizations Administered    Name Date Dose VIS Date Route   Pfizer COVID-19 Vaccine 12/28/2019  4:23 PM 0.3 mL 11/19/2019 Intramuscular   Manufacturer: [place]   Lot: [id number]   NDC: [id number]

## 2020-01-15 ENCOUNTER — Ambulatory Visit: Payer: Medicare Other | Attending: Internal Medicine

## 2020-01-15 DIAGNOSIS — Z23 Encounter for immunization: Secondary | ICD-10-CM | POA: Insufficient documentation

## 2020-01-15 NOTE — Progress Notes (Signed)
   Covid-19 Vaccination Clinic  Name:  Nicholas Huffman    MRN: [id number] DOB: 10-03-1946  01/15/2020  Mr. Deleo was observed post Covid-19 immunization for 15 minutes without incidence. He was provided with Vaccine Information Sheet and instruction to access the V-Safe system.   Mr. Ritts was instructed to call 911 with any severe reactions post vaccine: Marland Kitchen Difficulty breathing  . Swelling of your face and throat  . A fast heartbeat  . A bad rash all over your body  . Dizziness and weakness    Immunizations Administered    Name Date Dose VIS Date Route   Pfizer COVID-19 Vaccine 01/15/2020 11:51 AM 0.3 mL 11/19/2019 Intramuscular   Manufacturer: [place]   Lot: [id number]   [place]: [id number]

## 2020-03-27 ENCOUNTER — Encounter: Payer: Self-pay | Admitting: Gastroenterology

## 2020-03-28 ENCOUNTER — Ambulatory Visit (INDEPENDENT_AMBULATORY_CARE_PROVIDER_SITE_OTHER): Payer: Medicare Other | Admitting: Family Medicine

## 2020-03-28 ENCOUNTER — Encounter: Payer: Self-pay | Admitting: Family Medicine

## 2020-03-28 ENCOUNTER — Other Ambulatory Visit: Payer: Self-pay

## 2020-03-28 VITALS — BP 144/84 | HR 67 | Temp 97.8°F | Resp 16 | Ht 66.0 in | Wt 169.0 lb

## 2020-03-28 DIAGNOSIS — E7800 Pure hypercholesterolemia, unspecified: Secondary | ICD-10-CM | POA: Diagnosis not present

## 2020-03-28 DIAGNOSIS — Z136 Encounter for screening for cardiovascular disorders: Secondary | ICD-10-CM | POA: Diagnosis not present

## 2020-03-28 DIAGNOSIS — Z125 Encounter for screening for malignant neoplasm of prostate: Secondary | ICD-10-CM

## 2020-03-28 DIAGNOSIS — I10 Essential (primary) hypertension: Secondary | ICD-10-CM

## 2020-03-28 LAB — COMPLETE METABOLIC PANEL WITH GFR
AG Ratio: 1.8 (calc) (ref 1.0–2.5)
ALT: 18 U/L (ref 9–46)
AST: 18 U/L (ref 10–35)
Albumin: 4.4 g/dL (ref 3.6–5.1)
Alkaline phosphatase (APISO): 70 U/L (ref 35–144)
BUN: 21 mg/dL (ref 7–25)
CO2: 26 mmol/L (ref 20–32)
Calcium: 9.8 mg/dL (ref 8.6–10.3)
Chloride: 104 mmol/L (ref 98–110)
Creat: 1.1 mg/dL (ref 0.70–1.18)
GFR, Est African American: 77 mL/min/{1.73_m2} (ref >=60)
GFR, Est Non African American: 66 mL/min/{1.73_m2} (ref >=60)
Globulin: 2.4 g/dL (calc) (ref 1.9–3.7)
Glucose, Bld: 98 mg/dL (ref 65–99)
Potassium: 4 mmol/L (ref 3.5–5.3)
Sodium: 138 mmol/L (ref 135–146)
Total Bilirubin: 0.7 mg/dL (ref 0.2–1.2)
Total Protein: 6.8 g/dL (ref 6.1–8.1)

## 2020-03-28 LAB — CBC WITH DIFFERENTIAL/PLATELET
Absolute Monocytes: 467 cells/uL (ref 200–950)
Basophils Absolute: 110 cells/uL (ref 0–200)
Basophils Relative: 1.5 %
Eosinophils Absolute: 183 cells/uL (ref 15–500)
Eosinophils Relative: 2.5 %
HCT: 50.7 % — ABNORMAL HIGH (ref 38.5–50.0)
Hemoglobin: 16.8 g/dL (ref 13.2–17.1)
Lymphs Abs: 1387 cells/uL (ref 850–3900)
MCH: 31 pg (ref 27.0–33.0)
MCHC: 33.1 g/dL (ref 32.0–36.0)
MCV: 93.5 fL (ref 80.0–100.0)
MPV: 10.3 fL (ref 7.5–12.5)
Monocytes Relative: 6.4 %
Neutro Abs: 5154 cells/uL (ref 1500–7800)
Neutrophils Relative %: 70.6 %
Platelets: 271 10*3/uL (ref 140–400)
RBC: 5.42 10*6/uL (ref 4.20–5.80)
RDW: 13 % (ref 11.0–15.0)
Total Lymphocyte: 19 %
WBC: 7.3 10*3/uL (ref 3.8–10.8)

## 2020-03-28 LAB — LIPID PANEL
Cholesterol: 97 mg/dL (ref <200)
HDL: 41 mg/dL (ref >=40)
LDL Cholesterol (Calc): 40 mg/dL (calc)
Non-HDL Cholesterol (Calc): 56 mg/dL (calc) (ref <130)
Total CHOL/HDL Ratio: 2.4 (calc) (ref <5.0)
Triglycerides: 77 mg/dL (ref <150)

## 2020-03-28 LAB — PSA: PSA: 3.4 ng/mL (ref <=4.0)

## 2020-03-28 NOTE — Progress Notes (Signed)
Subjective:    Patient ID: Nicholas Huffman, male    DOB: 1946-10-24, 74 y.o.   MRN: [id number]  Patient is a very pleasant 74 year old Caucasian male here today for a checkup.  He has a history of hypertension.  His blood pressure today is elevated at 144/84.  I confirmed this myself.  He denies any chest pain shortness of breath or dyspnea on exertion.  Unfortunately continues to smoke and has no desire to quit smoking at the present time.  He also has a history of hyperlipidemia.  He is overdue for fasting lab work to monitor his cholesterol as well as his liver and kidney function.  His immunizations are up-to-date as listed below. Immunization History  Administered Date(s) Administered  . Influenza Split 09/22/2013  . Influenza, High Dose Seasonal PF 09/22/2014, 09/06/2016, 09/15/2017  . Influenza-Unspecified 09/26/2015  . PFIZER SARS-COV-2 Vaccination 12/28/2019, 01/15/2020  . Pneumococcal Conjugate-13 03/07/2016  . Pneumococcal Polysaccharide-23 09/28/2009, 11/18/2014  . Td 08/20/2001  . Tdap 07/22/2011   Patient has already scheduled a follow-up colonoscopy.  His colonoscopy in 2018 revealed 5 polyps.  He is already scheduled this follow-up with his gastroenterologist.  However he is due for PSA to screen for prostate cancer.  He is also due for an annual CT scan to evaluate for lung cancer.  We discussed this at length today and at the present time patient defers this.  He is not sure that he would want to know.  However he is also due for an ultrasound to evaluate for AAA.  We discussed this as well and the patient would like to schedule this test at the present time. Past Medical History:  Diagnosis Date  . Cancer ([place])    basal cell  . Cataract   . Fractured skull ([place])    as a child  . Hyperlipidemia   . Hypertension   . Primary localized osteoarthritis of right knee   . Smoker   . Wears glasses   . Wears hearing aid in both ears    Past Surgical History:  Procedure  Laterality Date  . BASAL CELL CARCINOMA EXCISION  2016   face  . CATARACT EXTRACTION W/ INTRAOCULAR LENS  IMPLANT, BILATERAL    . COLONOSCOPY  2008, 2002   hx polyps/Dr. Fuller Plan  . EYE SURGERY     retina detachment repair x 2  . KNEE ARTHROSCOPY  April/2013   left  . 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 amLODipine (NORVASC) 10 MG tablet Take 1 tablet (10 mg total) by mouth daily. 90 tablet 3  . aspirin EC 81 MG tablet Take 1 tablet (81 mg total) by mouth 2 (two) times daily. (Patient taking differently: Take 81 mg by mouth daily. ) 84 tablet 0  . atorvastatin (LIPITOR) 40 MG tablet Take 1 tablet (40 mg total) by mouth daily at 6 PM. 90 tablet 3  . losartan-hydrochlorothiazide (HYZAAR) 100-25 MG tablet TAKE 1 TABLET BY MOUTH DAILY. 90 tablet 3   Current Facility-Administered Medications on File Prior to Visit  Medication Dose Route Frequency Provider Last Rate Last Admin  . 0.9 %  sodium chloride infusion  500 mL Intravenous Continuous Ladene Artist, MD       No Known Allergies Social History  Socioeconomic History  . Marital status: Married    Spouse name: Not on file  . Number of children: Not on file  . Years of education: Not on file  . Highest education level: Not on file  Occupational History  . Not on file  Tobacco Use  . Smoking status: Current Every Day Smoker    Packs/day: 1.00    Types: Cigarettes  . Smokeless tobacco: Never Used  . Tobacco comment: pt to call PCP for patch  Substance and Sexual Activity  . Alcohol use: No  . Drug use: No  . Sexual activity: Not on file  Other Topics Concern  . Not on file  Social History Narrative  . Not on file   Social Determinants of Health   Financial Resource Strain:   . Difficulty of  Paying Living Expenses:   Food Insecurity:   . Worried About Charity fundraiser in the Last Year:   . Arboriculturist in the Last Year:   Transportation Needs:   . Film/video editor (Medical):   Marland Kitchen Lack of Transportation (Non-Medical):   Physical Activity:   . Days of Exercise per Week:   . Minutes of Exercise per Session:   Stress:   . Feeling of Stress :   Social Connections:   . Frequency of Communication with Friends and Family:   . Frequency of Social Gatherings with Friends and Family:   . Attends Religious Services:   . Active Member of Clubs or Organizations:   . Attends Archivist Meetings:   Marland Kitchen Marital Status:   Intimate Partner Violence:   . Fear of Current or Ex-Partner:   . Emotionally Abused:   Marland Kitchen Physically Abused:   . Sexually Abused:     Past Medical History:  Diagnosis Date  . Cancer ([place])    basal cell  . Cataract   . Fractured skull ([place])    as a child  . Hyperlipidemia   . Hypertension   . Primary localized osteoarthritis of right knee   . Smoker   . Wears glasses   . Wears hearing aid in both ears    Past Surgical History:  Procedure Laterality Date  . BASAL CELL CARCINOMA EXCISION  2016   face  . CATARACT EXTRACTION W/ INTRAOCULAR LENS  IMPLANT, BILATERAL    . COLONOSCOPY  2008, 2002   hx polyps/Dr. Fuller Plan  . EYE SURGERY     retina detachment repair x 2  . KNEE ARTHROSCOPY  April/2013   left  . TOTAL KNEE ARTHROPLASTY Right 03/26/2018   Procedure: RIGHT TOTAL KNEE ARTHROPLASTY;  Surgeon: Leandrew Koyanagi, MD;  Location: [facility];  Service: Orthopedics;  Laterality: Right;   Current Outpatient Medications on File Prior to Visit  Medication Sig Dispense Refill  . amLODipine (NORVASC) 10 MG tablet Take 1 tablet (10 mg total) by mouth daily. 90 tablet 3  . aspirin EC 81 MG tablet Take 1 tablet (81 mg total) by mouth 2 (two) times daily. (Patient taking differently: Take 81 mg by mouth daily. ) 84 tablet 0  . atorvastatin (LIPITOR) 40  MG tablet Take 1 tablet (40 mg total) by mouth daily at 6 PM. 90 tablet 3  . losartan-hydrochlorothiazide (HYZAAR) 100-25 MG tablet TAKE 1 TABLET BY MOUTH DAILY. 90 tablet 3   Current Facility-Administered Medications on File Prior to Visit  Medication Dose Route Frequency Provider Last Rate Last Admin  . 0.9 %  sodium chloride infusion  500  mL Intravenous Continuous Ladene Artist, MD       No Known Allergies Social History   Socioeconomic History  . Marital status: Married    Spouse name: Not on file  . Number of children: Not on file  . Years of education: Not on file  . Highest education level: Not on file  Occupational History  . Not on file  Tobacco Use  . Smoking status: Current Every Day Smoker    Packs/day: 1.00    Types: Cigarettes  . Smokeless tobacco: Never Used  . Tobacco comment: pt to call PCP for patch  Substance and Sexual Activity  . Alcohol use: No  . Drug use: No  . Sexual activity: Not on file  Other Topics Concern  . Not on file  Social History Narrative  . Not on file   Social Determinants of Health   Financial Resource Strain:   . Difficulty of Paying Living Expenses:   Food Insecurity:   . Worried About Charity fundraiser in the Last Year:   . Arboriculturist in the Last Year:   Transportation Needs:   . Film/video editor (Medical):   Marland Kitchen Lack of Transportation (Non-Medical):   Physical Activity:   . Days of Exercise per Week:   . Minutes of Exercise per Session:   Stress:   . Feeling of Stress :   Social Connections:   . Frequency of Communication with Friends and Family:   . Frequency of Social Gatherings with Friends and Family:   . Attends Religious Services:   . Active Member of Clubs or Organizations:   . Attends Archivist Meetings:   Marland Kitchen Marital Status:   Intimate Partner Violence:   . Fear of Current or Ex-Partner:   . Emotionally Abused:   Marland Kitchen Physically Abused:   . Sexually Abused:       Review of Systems   All other systems reviewed and are negative.      Objective:   Physical Exam  Constitutional: He appears well-developed and well-nourished. No distress.  HENT:  Head: Normocephalic and atraumatic.  Right Ear: External ear normal.  Left Ear: External ear normal.  Nose: Nose normal.  Mouth/Throat: Oropharynx is clear and moist. No oropharyngeal exudate.  Eyes: Conjunctivae are normal.  Cardiovascular: Normal rate, regular rhythm and normal heart sounds.  No murmur heard. Pulmonary/Chest: Effort normal and breath sounds normal. No respiratory distress. He has no wheezes. He has no rales.  Abdominal: Soft. Bowel sounds are normal. He exhibits no distension. There is no abdominal tenderness. There is no rebound.  Musculoskeletal:        General: No edema.     Cervical back: Neck supple.  Lymphadenopathy:    He has no cervical adenopathy.  Skin: No rash noted. He is not diaphoretic. No erythema.  Vitals reviewed.         Assessment & Plan:  Benign essential HTN - Plan: CBC with Differential/Platelet, COMPLETE METABOLIC PANEL WITH GFR, Lipid panel  Prostate cancer screening - Plan: PSA  Encounter for abdominal aortic aneurysm (AAA) screening - Plan: VAS [place] AAA DUPLEX  Pure hypercholesterolemia   Blood pressure is elevated today.  Of asked the patient check his blood pressure at home several times over the next week and notify me of values.  If persistently elevated, we will need to add additional medication to achieve a goal blood pressure less than 140/90.  I will check a fasting lipid panel.  Ideally I like his LDL cholesterol to be well below 100.  I will screen the patient for prostate cancer with a PSA.  I will screen for AAA with an ultrasound.  I recommended lung cancer screening in addition to smoking cessation with annual CT scan of the chest.  Patient will consider this but at the present time he declines a screening test.  He denies any depression or memory loss.

## 2020-03-30 ENCOUNTER — Other Ambulatory Visit: Payer: Self-pay

## 2020-03-30 ENCOUNTER — Ambulatory Visit (HOSPITAL_COMMUNITY)
Admission: RE | Admit: 2020-03-30 | Discharge: 2020-03-30 | Disposition: A | Discharge status: Home / Self Care | Payer: Medicare Other | Source: Ambulatory Visit | Attending: Family Medicine | Admitting: Family Medicine

## 2020-03-30 DIAGNOSIS — Z136 Encounter for screening for cardiovascular disorders: Secondary | ICD-10-CM | POA: Diagnosis not present

## 2020-04-03 DIAGNOSIS — Z961 Presence of intraocular lens: Secondary | ICD-10-CM | POA: Diagnosis not present

## 2020-04-08 ENCOUNTER — Other Ambulatory Visit: Payer: Self-pay | Admitting: Family Medicine

## 2020-04-20 ENCOUNTER — Ambulatory Visit (AMBULATORY_SURGERY_CENTER): Payer: Self-pay | Admitting: *Deleted

## 2020-04-20 ENCOUNTER — Other Ambulatory Visit: Payer: Self-pay

## 2020-04-20 VITALS — Temp 97.5°F | Ht 66.0 in | Wt 171.6 lb

## 2020-04-20 DIAGNOSIS — Z86010 Personal history of colonic polyps: Secondary | ICD-10-CM

## 2020-04-20 MED ORDER — SUTAB 1479-225-188 MG PO TABS: 1.0000 | ORAL_TABLET | Freq: Once | ORAL | 0 refills | Status: AC

## 2020-04-20 NOTE — Progress Notes (Signed)
2nd dose of covid vaccine 01-15-20  Pt is aware that care partner will wait in the car during procedure; if they feel like they will be too hot or cold to wait in the car; they may wait in the 4 th floor lobby. Patient is aware to bring only one care partner. We want them to wear a mask (we do not have any that we can provide them), practice social distancing, and we will check their temperatures when they get here.  I did remind the patient that their care partner needs to stay in the parking lot the entire time and have a cell phone available, we will call them when the pt is ready for discharge. Patient will wear mask into building.    No trouble with anesthesia, difficulty with intubation or hx/fam hx of malignant hyperthermia per pt   No egg or soy allergy  No home oxygen use   No medications for weight loss taken  emmi information given  Pt denies constipation issues   Sutab code put into RX and paper copy given to pt to show pharmacy

## 2020-05-04 ENCOUNTER — Encounter: Payer: Self-pay | Admitting: Gastroenterology

## 2020-05-04 ENCOUNTER — Other Ambulatory Visit: Payer: Self-pay

## 2020-05-04 ENCOUNTER — Ambulatory Visit (AMBULATORY_SURGERY_CENTER): Payer: Medicare Other | Admitting: Gastroenterology

## 2020-05-04 VITALS — BP 104/74 | HR 95 | Temp 98.4°F | Resp 16 | Ht 66.0 in | Wt 171.8 lb

## 2020-05-04 DIAGNOSIS — D124 Benign neoplasm of descending colon: Secondary | ICD-10-CM | POA: Diagnosis not present

## 2020-05-04 DIAGNOSIS — Z86010 Personal history of colonic polyps: Secondary | ICD-10-CM

## 2020-05-04 DIAGNOSIS — D122 Benign neoplasm of ascending colon: Secondary | ICD-10-CM

## 2020-05-04 DIAGNOSIS — D123 Benign neoplasm of transverse colon: Secondary | ICD-10-CM | POA: Diagnosis not present

## 2020-05-04 MED ORDER — SODIUM CHLORIDE 0.9 % IV SOLN: 500.0000 mL | Freq: Once | INTRAVENOUS | Status: DC

## 2020-05-04 NOTE — Op Note (Signed)
[facility] Patient Name: Nicholas Huffman Procedure Date: 05/04/2020 9:12 AM MRN: [id number] Endoscopist: Ladene Artist , MD Age: 74 Referring MD:  Date of Birth: [birth year] Gender: Male Account #: [id number] Procedure:                Colonoscopy Indications:              Surveillance: Personal history of adenomatous                            polyps on last colonoscopy 3 years ago,                            Surveillance: Piecemeal removal of large sessile                            adenoma last colonoscopy (< 3 yrs) Medicines:                Monitored Anesthesia Care Procedure:                Pre-Anesthesia Assessment:                           - Prior to the procedure, a History and Physical                            was performed, and patient medications and                            allergies were reviewed. The patient's tolerance of                            previous anesthesia was also reviewed. The risks                            and benefits of the procedure and the sedation                            options and risks were discussed with the patient.                            All questions were answered, and informed consent                            was obtained. Prior Anticoagulants: The patient has                            taken no previous anticoagulant or antiplatelet                            agents. ASA Grade Assessment: II - A patient with                            mild systemic disease. After reviewing the risks  and benefits, the patient was deemed in                            satisfactory condition to undergo the procedure.                           After obtaining informed consent, the colonoscope                            was passed under direct vision. Throughout the                            procedure, the patient's blood pressure, pulse, and                            oxygen saturations were monitored  continuously. The                            Colonoscope was introduced through the anus and                            advanced to the the cecum, identified by                            appendiceal orifice and ileocecal valve. The                            ileocecal valve, appendiceal orifice, and rectum                            were photographed. The quality of the bowel                            preparation was adequate after extensive lavage and                            suction. The colonoscopy was performed without                            difficulty. The patient tolerated the procedure                            well. Scope In: 9:22:17 AM Scope Out: 9:45:59 AM Scope Withdrawal Time: 0 hours 19 minutes 20 seconds  Total Procedure Duration: 0 hours 23 minutes 42 seconds  Findings:                 The perianal and digital rectal examinations were                            normal.                           A 14 mm polyp was found in the ascending colon. The  polyp was sessile. The polyp was removed with a hot                            snare. Resection and retrieval were complete.                           Two sessile polyps were found in the ascending                            colon. The polyps were 15 to 20 mm in size. These                            polyps were removed with a piecemeal technique                            using a hot snare. Resection and retrieval were                            complete.                           Two sessile polyps were found in the hepatic                            flexure. The polyps were 7 to 9 mm in size. These                            polyps were removed with a cold snare and hot snare                            respectively. Resection and retrieval were complete.                           A tattoo was seen in the ascending colon. The                            tattoo site appeared abnormal.                            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                           Two sessile polyps were found in the descending                            colon. The polyps were 6 to 7 mm in size. These  polyps were removed with a cold snare. Resection                            and retrieval were complete.                           Multiple small-mouthed diverticula were found in                            the left colon. There was no evidence of                            diverticular bleeding.                           Anal papilla(e) were hypertrophied.                           Internal hemorrhoids were found during                            retroflexion. The hemorrhoids were medium-sized and                            Grade I (internal hemorrhoids that do not prolapse).                           The exam was otherwise without abnormality on                            direct and retroflexion views. Complications:            No immediate complications. Estimated blood loss:                            None. Estimated Blood Loss:     Estimated blood loss: none. Impression:               - One 14 mm polyp in the ascending colon, removed                            with a hot snare. Resected and retrieved.                           - Two 15 to 20 mm polyps in the ascending colon,                            removed piecemeal using a hot snare. Resected and                            retrieved.                           - Two 7 to 9 mm polyps at the hepatic flexure,  removed with a hot snare. Resected and retrieved.                           - A tattoo was seen in the ascending colon. The                            tattoo site appeared abnormal.                           - Multiple non-bleeding  colonic angiodysplastic                            lesions.                           - Two 6 to 7 mm polyps in the descending colon,                            removed with a cold snare. Resected and retrieved.                           - Mild diverticulosis in the left colon.                           - Hypertrophied anal papillae                           - Internal hemorrhoids.                           - The examination was otherwise normal on direct                            and retroflexion views. Recommendation:           - Repeat colonoscopy in 6 months for surveillance                            after piecemeal polypectomy with a more extensive                            bowel prep.                           - Patient has a contact number available for                            emergencies. The signs and symptoms of potential                            delayed complications were discussed with the                            patient. Return to normal activities tomorrow.  Written discharge instructions were provided to the                            patient.                           - Resume previous diet.                           - Continue present medications.                           - Await pathology results.                           - No aspirin, ibuprofen, naproxen, or other                            non-steroidal anti-inflammatory drugs for 3 weeks                            after polyp removal. Ladene Artist, MD 05/04/2020 9:55:43 AM This report has been signed electronically.

## 2020-05-04 NOTE — Progress Notes (Signed)
VS- Courtney Washington  Pt's states no medical or surgical changes since previsit or office visit.  Pt's pulse 140 upon admission.  He denies any chest discomfort, SOB.  828- radial pulse rechecked- rate 98.  Pt still denies any discomfort or SOB

## 2020-05-04 NOTE — Patient Instructions (Signed)
YOU HAD AN ENDOSCOPIC PROCEDURE TODAY AT THE [facility]:   Refer to the procedure report that was given to you for any specific questions about what was found during the examination.  If the procedure report does not answer your questions, please call your gastroenterologist to clarify.  If you requested that your care partner not be given the details of your procedure findings, then the procedure report has been included in a sealed envelope for you to review at your convenience later.  YOU SHOULD EXPECT: Some feelings of bloating in the abdomen. Passage of more gas than usual.  Walking can help get rid of the air that was put into your GI tract during the procedure and reduce the bloating. If you had a lower endoscopy (such as a colonoscopy or flexible sigmoidoscopy) you may notice spotting of blood in your stool or on the toilet paper. If you underwent a bowel prep for your procedure, you may not have a normal bowel movement for a few days.  Please Note:  You might notice some irritation and congestion in your nose or some drainage.  This is from the oxygen used during your procedure.  There is no need for concern and it should clear up in a day or so.  SYMPTOMS TO REPORT IMMEDIATELY:   Following lower endoscopy (colonoscopy or flexible sigmoidoscopy):  Excessive amounts of blood in the stool  Significant tenderness or worsening of abdominal pains  Swelling of the abdomen that is new, acute  Fever of 100F or higher  For urgent or emergent issues, a gastroenterologist can be reached at any hour by calling [phone]. Do not use MyChart messaging for urgent concerns.    DIET:  We do recommend a small meal at first, but then you may proceed to your regular diet.  Drink plenty of fluids but you should avoid alcoholic beverages for 24 hours.  ACTIVITY:  You should plan to take it easy for the rest of today and you should NOT DRIVE or use heavy machinery until tomorrow (because  of the sedation medicines used during the test).    FOLLOW UP: Our staff will call the number listed on your records 48-72 hours following your procedure to check on you and address any questions or concerns that you may have regarding the information given to you following your procedure. If we do not reach you, we will leave a message.  We will attempt to reach you two times.  During this call, we will ask if you have developed any symptoms of COVID 19. If you develop any symptoms (ie: fever, flu-like symptoms, shortness of breath, cough etc.) before then, please call [phone].  If you test positive for Covid 19 in the 2 weeks post procedure, please call and report this information to us.    If any biopsies were taken you will be contacted by phone or by letter within the next 1-3 weeks.  Please call us at [phone] if you have not heard about the biopsies in 3 weeks.    SIGNATURES/CONFIDENTIALITY: You and/or your care partner have signed paperwork which will be entered into your electronic medical record.  These signatures attest to the fact that that the information above on your After Visit Summary has been reviewed and is understood.  Full responsibility of the confidentiality of this discharge information lies with you and/or your care-partner. 

## 2020-05-04 NOTE — Progress Notes (Signed)
pt tolerated well. VSS. awake and to recovery. Report given to RN.  

## 2020-05-09 ENCOUNTER — Telehealth: Payer: Self-pay | Admitting: *Deleted

## 2020-05-09 ENCOUNTER — Telehealth: Payer: Self-pay

## 2020-05-09 NOTE — Telephone Encounter (Signed)
Called [phone] and left a messaged we tried to reach pt for a follow up call. maw

## 2020-05-09 NOTE — Telephone Encounter (Signed)
1. Have you developed a fever since your procedure? no  2.   Have you had an respiratory symptoms (SOB or cough) since your procedure? no  3.   Have you tested positive for COVID 19 since your procedure no  4.   Have you had any family members/close contacts diagnosed with the COVID 19 since your procedure?  no   If yes to any of these questions please route to Joylene Keiden, RN and Erenest Rasher, RN Follow up Call-  Call back number 05/04/2020  Post procedure Call Back phone  # [phone]  Permission to leave phone message Yes  Some recent data might be hidden     Patient questions:  Do you have a fever, pain , or abdominal swelling? No. Pain Score  0 *  Have you tolerated food without any problems? Yes.    Have you been able to return to your normal activities? Yes.    Do you have any questions about your discharge instructions: Diet   No. Medications  No. Follow up visit  No.  Do you have questions or concerns about your Care? No.  Actions: * If pain score is 4 or above: No action needed, pain <4.

## 2020-05-29 ENCOUNTER — Encounter: Payer: Self-pay | Admitting: Gastroenterology

## 2020-09-27 DIAGNOSIS — Z23 Encounter for immunization: Secondary | ICD-10-CM | POA: Diagnosis not present

## 2021-01-08 ENCOUNTER — Other Ambulatory Visit: Payer: Self-pay | Admitting: Family Medicine

## 2021-01-10 ENCOUNTER — Other Ambulatory Visit: Payer: Self-pay | Admitting: *Deleted

## 2021-01-10 MED ORDER — HYDROCHLOROTHIAZIDE 25 MG PO TABS
25.0000 mg | ORAL_TABLET | Freq: Every day | ORAL | 3 refills | Status: DC
Start: 2021-01-10 — End: 2021-03-13

## 2021-01-10 MED ORDER — LOSARTAN POTASSIUM 100 MG PO TABS
100.0000 mg | ORAL_TABLET | Freq: Every day | ORAL | 3 refills | Status: DC
Start: 2021-01-10 — End: 2021-03-13

## 2021-01-10 NOTE — Telephone Encounter (Signed)
Hyzaar on backorder.   Medication combo split.

## 2021-01-17 ENCOUNTER — Encounter: Payer: Self-pay | Admitting: Gastroenterology

## 2021-03-13 ENCOUNTER — Ambulatory Visit (INDEPENDENT_AMBULATORY_CARE_PROVIDER_SITE_OTHER): Payer: 59 | Admitting: Family Medicine

## 2021-03-13 ENCOUNTER — Encounter: Payer: Self-pay | Admitting: Family Medicine

## 2021-03-13 ENCOUNTER — Other Ambulatory Visit: Payer: Self-pay

## 2021-03-13 VITALS — BP 110/62 | HR 86 | Temp 98.4°F | Resp 14 | Ht 66.0 in | Wt 170.0 lb

## 2021-03-13 DIAGNOSIS — Z125 Encounter for screening for malignant neoplasm of prostate: Secondary | ICD-10-CM

## 2021-03-13 DIAGNOSIS — E7800 Pure hypercholesterolemia, unspecified: Secondary | ICD-10-CM

## 2021-03-13 DIAGNOSIS — I10 Essential (primary) hypertension: Secondary | ICD-10-CM | POA: Diagnosis not present

## 2021-03-13 MED ORDER — ATORVASTATIN CALCIUM 40 MG PO TABS: 40.0000 mg | ORAL_TABLET | Freq: Every day | ORAL | 3 refills | Status: DC

## 2021-03-13 MED ORDER — LOSARTAN POTASSIUM-HCTZ 100-25 MG PO TABS
1.0000 | ORAL_TABLET | Freq: Every day | ORAL | 3 refills | Status: DC
Start: 2021-03-13 — End: 2022-04-11

## 2021-03-13 MED ORDER — AMLODIPINE BESYLATE 10 MG PO TABS: 1.0000 | ORAL_TABLET | Freq: Every day | ORAL | 3 refills | Status: DC

## 2021-03-13 NOTE — Progress Notes (Signed)
Subjective:    Patient ID: Nicholas Huffman, male    DOB: 05-02-1946, 75 y.o.   MRN: [id number]  Patient is a very pleasant 75 year old gentleman who is here today to get his medications refilled.  Unfortunately continues to smoke and has no desire to quit at the present time.  He is slightly wheezing on exam however he denies any chest pain or shortness of breath or dyspnea on exertion.  He denies any hemoptysis.  We have discussed lung cancer screening with a CAT scan.  We discussed this again today however he declines lung cancer screening.  He is due for a booster on his COVID shot as well as the shingles vaccine which I recommended both to him today.  Otherwise he is doing well and his blood pressure today is well controlled Past Medical History:  Diagnosis Date  . Cancer ([place])    basal cell  . Cataract   . Fractured skull ([place])    as a child  . Hyperlipidemia   . Hypertension   . Primary localized osteoarthritis of right knee    hx of- knee has been replaced  . Smoker   . Wears glasses   . Wears hearing aid in both ears    Past Surgical History:  Procedure Laterality Date  . BASAL CELL CARCINOMA EXCISION  2016   face  . CATARACT EXTRACTION W/ INTRAOCULAR LENS  IMPLANT, BILATERAL    . COLONOSCOPY  2008, 2002   hx polyps/Dr. Fuller Plan  . EYE SURGERY     retina detachment repair x 2  . KNEE ARTHROSCOPY  April/2013   left  . 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 amLODipine (NORVASC) 10 MG tablet TAKE 1 TABLET BY MOUTH EVERY DAY 90 tablet 3  . aspirin EC 81 MG tablet Take 1 tablet (81 mg total) by mouth 2 (two) times daily. (Patient taking differently: Take 81 mg by mouth daily. ) 84 tablet 0  . atorvastatin (LIPITOR) 40 MG tablet TAKE 1 TABLET (40 MG TOTAL)  BY MOUTH DAILY AT 6 PM. 90 tablet 3  . hydrochlorothiazide (HYDRODIURIL) 25 MG tablet Take 1 tablet (25 mg total) by mouth daily. 90 tablet 3  . losartan (COZAAR) 100 MG tablet Take 1 tablet (100 mg total) by mouth daily. 90 tablet 3   No current facility-administered medications on file prior to visit.   No Known Allergies Social History   Socioeconomic History  . Marital status: Married    Spouse name: Not on file  . Number of children: Not on file  . Years of education: Not on file  . Highest education level: Not on file  Occupational History  . Not on file  Tobacco Use  . Smoking status: Current Every Day Smoker    Packs/day: 1.00    Types: Cigarettes  . Smokeless tobacco: Never Used  . Tobacco comment: pt to call PCP for patch  Vaping Use  . Vaping Use: Never used  Substance and Sexual Activity  . Alcohol use: No  . Drug use: No  . Sexual activity: Not on file  Other Topics Concern  . Not on file  Social History Narrative  . Not on file   Social Determinants of Health   Financial Resource Strain: Not on  file  Food Insecurity: Not on file  Transportation Needs: Not on file  Physical Activity: Not on file  Stress: Not on file  Social Connections: Not on file  Intimate Partner Violence: Not on file    Past Medical History:  Diagnosis Date  . Cancer ([place])    basal cell  . Cataract   . Fractured skull ([place])    as a child  . Hyperlipidemia   . Hypertension   . Primary localized osteoarthritis of right knee    hx of- knee has been replaced  . Smoker   . Wears glasses   . Wears hearing aid in both ears    Past Surgical History:  Procedure Laterality Date  . BASAL CELL CARCINOMA EXCISION  2016   face  . CATARACT EXTRACTION W/ INTRAOCULAR LENS  IMPLANT, BILATERAL    . COLONOSCOPY  2008, 2002   hx polyps/Dr. Fuller Plan  . EYE SURGERY     retina detachment repair x 2  . KNEE ARTHROSCOPY  April/2013   left  . 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 amLODipine (NORVASC) 10 MG tablet TAKE 1 TABLET BY MOUTH EVERY DAY 90 tablet 3  . aspirin EC 81 MG tablet Take 1 tablet (81 mg total) by mouth 2 (two) times daily. (Patient taking differently: Take 81 mg by mouth daily. ) 84 tablet 0  . atorvastatin (LIPITOR) 40 MG tablet TAKE 1 TABLET (40 MG TOTAL) BY MOUTH DAILY AT 6 PM. 90 tablet 3  . hydrochlorothiazide (HYDRODIURIL) 25 MG tablet Take 1 tablet (25 mg total) by mouth daily. 90 tablet 3  . losartan (COZAAR) 100 MG tablet Take 1 tablet (100 mg total) by mouth daily. 90 tablet 3   No current facility-administered medications on file prior to visit.   No Known Allergies Social History   Socioeconomic History  . Marital status: Married    Spouse name: Not on file  . Number of children: Not on file  . Years of education: Not on file  . Highest education level: Not on file  Occupational History  . Not on file  Tobacco Use  . Smoking status: Current Every Day Smoker    Packs/day: 1.00    Types: Cigarettes  . Smokeless tobacco: Never Used  . Tobacco comment: pt to call PCP for patch  Vaping Use  . Vaping Use: Never used  Substance and Sexual Activity  . Alcohol use: No  . Drug use: No  . Sexual activity: Not on file  Other Topics Concern  . Not on file  Social History Narrative  . Not on file   Social Determinants of Health   Financial Resource Strain: Not on file  Food Insecurity: Not on file  Transportation Needs: Not on file  Physical Activity: Not on file  Stress: Not on file  Social Connections: Not on file  Intimate Partner Violence: Not on file      Review of Systems  All other systems reviewed and are negative.      Objective:   Physical Exam Vitals reviewed.  Constitutional:       General: He is not in acute distress.    Appearance: He is well-developed. He is not diaphoretic.  HENT:     Head: Normocephalic and atraumatic.  Right Ear: External ear normal.     Left Ear: External ear normal.     Nose: Nose normal.     Mouth/Throat:     Pharynx: No oropharyngeal exudate.  Eyes:     Conjunctiva/sclera: Conjunctivae normal.  Cardiovascular:     Rate and Rhythm: Normal rate and regular rhythm.     Heart sounds: Normal heart sounds. No murmur heard.   Pulmonary:     Effort: Pulmonary effort is normal. No respiratory distress.     Breath sounds: Normal breath sounds. No wheezing or rales.  Abdominal:     General: Bowel sounds are normal. There is no distension.     Palpations: Abdomen is soft.     Tenderness: There is no abdominal tenderness. There is no rebound.  Musculoskeletal:     Cervical back: Neck supple.  Lymphadenopathy:     Cervical: No cervical adenopathy.  Skin:    Findings: No erythema or rash.           Assessment & Plan:  Benign essential HTN - Plan: CBC with Differential/Platelet, COMPLETE METABOLIC PANEL WITH GFR, Lipid panel  Pure hypercholesterolemia - Plan: CBC with Differential/Platelet, COMPLETE METABOLIC PANEL WITH GFR, Lipid panel  Prostate cancer screening - Plan: PSA  Continue to encourage the patient to quit smoking.  I will screen for prostate cancer with a PSA as his most recent PSA has been slightly elevated at greater than 3 but stable.  Blood pressure today is excellent.  Check CBC, CMP, lipid panel.  Goal LDL cholesterol is less than 100.  Blood pressure today is well controlled.  We discussed lung cancer screening but he politely declined

## 2021-03-14 LAB — PSA: PSA: 4.01 ng/mL — ABNORMAL HIGH (ref <=4.0)

## 2021-03-14 LAB — COMPLETE METABOLIC PANEL WITH GFR
AG Ratio: 2 (calc) (ref 1.0–2.5)
ALT: 18 U/L (ref 9–46)
AST: 19 U/L (ref 10–35)
Albumin: 4.3 g/dL (ref 3.6–5.1)
Alkaline phosphatase (APISO): 70 U/L (ref 35–144)
BUN/Creatinine Ratio: 20 (calc) (ref 6–22)
BUN: 24 mg/dL (ref 7–25)
CO2: 27 mmol/L (ref 20–32)
Calcium: 9.7 mg/dL (ref 8.6–10.3)
Chloride: 102 mmol/L (ref 98–110)
Creat: 1.2 mg/dL — ABNORMAL HIGH (ref 0.70–1.18)
GFR, Est African American: 69 mL/min/{1.73_m2} (ref >=60)
GFR, Est Non African American: 59 mL/min/{1.73_m2} — ABNORMAL LOW (ref >=60)
Globulin: 2.2 g/dL (calc) (ref 1.9–3.7)
Glucose, Bld: 98 mg/dL (ref 65–99)
Potassium: 3.9 mmol/L (ref 3.5–5.3)
Sodium: 139 mmol/L (ref 135–146)
Total Bilirubin: 0.7 mg/dL (ref 0.2–1.2)
Total Protein: 6.5 g/dL (ref 6.1–8.1)

## 2021-03-14 LAB — CBC WITH DIFFERENTIAL/PLATELET
Absolute Monocytes: 470 cells/uL (ref 200–950)
Basophils Absolute: 123 cells/uL (ref 0–200)
Basophils Relative: 1.6 %
Eosinophils Absolute: 231 cells/uL (ref 15–500)
Eosinophils Relative: 3 %
HCT: 49.4 % (ref 38.5–50.0)
Hemoglobin: 16.8 g/dL (ref 13.2–17.1)
Lymphs Abs: 1432 cells/uL (ref 850–3900)
MCH: 31.6 pg (ref 27.0–33.0)
MCHC: 34 g/dL (ref 32.0–36.0)
MCV: 92.9 fL (ref 80.0–100.0)
MPV: 10.6 fL (ref 7.5–12.5)
Monocytes Relative: 6.1 %
Neutro Abs: 5444 cells/uL (ref 1500–7800)
Neutrophils Relative %: 70.7 %
Platelets: 253 10*3/uL (ref 140–400)
RBC: 5.32 10*6/uL (ref 4.20–5.80)
RDW: 12.8 % (ref 11.0–15.0)
Total Lymphocyte: 18.6 %
WBC: 7.7 10*3/uL (ref 3.8–10.8)

## 2021-03-14 LAB — LIPID PANEL
Cholesterol: 94 mg/dL (ref <200)
HDL: 44 mg/dL (ref >=40)
LDL Cholesterol (Calc): 33 mg/dL (calc)
Non-HDL Cholesterol (Calc): 50 mg/dL (calc) (ref <130)
Total CHOL/HDL Ratio: 2.1 (calc) (ref <5.0)
Triglycerides: 90 mg/dL (ref <150)

## 2022-02-08 ENCOUNTER — Other Ambulatory Visit: Payer: Self-pay | Admitting: Family Medicine

## 2022-03-06 ENCOUNTER — Other Ambulatory Visit: Payer: Self-pay | Admitting: Family Medicine

## 2022-04-02 ENCOUNTER — Other Ambulatory Visit: Payer: Self-pay | Admitting: Family Medicine

## 2022-04-02 NOTE — Telephone Encounter (Signed)
Please contact patient to schedule OV, not seen in >72yr Thanks! ? ?Once scheduled we can send in refill.  ?

## 2022-04-02 NOTE — Telephone Encounter (Signed)
Called pt to schedule, unable to lvm, will continue to reach out to pt to schedule an ov ?

## 2022-04-11 ENCOUNTER — Ambulatory Visit (INDEPENDENT_AMBULATORY_CARE_PROVIDER_SITE_OTHER): Payer: 59 | Admitting: Family Medicine

## 2022-04-11 VITALS — BP 120/72 | HR 67 | Temp 97.5°F | Ht 66.0 in | Wt 160.0 lb

## 2022-04-11 DIAGNOSIS — E7800 Pure hypercholesterolemia, unspecified: Secondary | ICD-10-CM | POA: Diagnosis not present

## 2022-04-11 DIAGNOSIS — I10 Essential (primary) hypertension: Secondary | ICD-10-CM

## 2022-04-11 DIAGNOSIS — Z125 Encounter for screening for malignant neoplasm of prostate: Secondary | ICD-10-CM | POA: Diagnosis not present

## 2022-04-11 MED ORDER — AMLODIPINE BESYLATE 10 MG PO TABS: 10.0000 mg | ORAL_TABLET | Freq: Every day | ORAL | 3 refills | Status: DC

## 2022-04-11 MED ORDER — ATORVASTATIN CALCIUM 40 MG PO TABS: 40.0000 mg | ORAL_TABLET | Freq: Every day | ORAL | 3 refills | Status: DC

## 2022-04-11 MED ORDER — LOSARTAN POTASSIUM-HCTZ 100-25 MG PO TABS: 1.0000 | ORAL_TABLET | Freq: Every day | ORAL | 3 refills | Status: DC

## 2022-04-11 NOTE — Progress Notes (Signed)
? ?Subjective:  ? ? Patient ID: Nicholas Huffman, male    DOB: 02-Oct-1946, 76 y.o.   MRN: [id number] ? ?Patient is a 76 year old Caucasian male who is here today to get a refill on his medication.  He has a history of hypertension hyperlipidemia and smoking.  His blood pressure today is well controlled.  He denies any chest pain shortness of breath or dyspnea on exertion.  He continues to smoke and has no desire to quit.  His last colonoscopy was 2021.  They found several precancerous polyps and his gastroenterologist recommended a repeat colonoscopy in 6 months.  He has not followed up since with his gastroenterologist.  I encouraged him to follow-up with his gastroenterologist and he states that he will call and make the appointment.  He is due to recheck a PSA screen for prostate cancer as it was borderline elevated for the last year.  He is overdue for a lung CT to screen for lung cancer but he declines this.  We discussed this at length and he continues to decline this because he states that it "make him worry".  He has been checked for AAA in 2021. ?Past Medical History:  ?Diagnosis Date  ? Cancer [facility])   ? basal cell  ? Cataract   ? Fractured skull ([place])   ? as a child  ? Hyperlipidemia   ? Hypertension   ? Primary localized osteoarthritis of right knee   ? hx of- knee has been replaced  ? Smoker   ? Wears glasses   ? Wears hearing aid in both ears   ? ?Past Surgical History:  ?Procedure Laterality Date  ? BASAL CELL CARCINOMA EXCISION  2016  ? face  ? CATARACT EXTRACTION W/ INTRAOCULAR LENS  IMPLANT, BILATERAL    ? COLONOSCOPY  2008, 2002  ? hx polyps/Dr. Fuller Plan  ? EYE SURGERY    ? retina detachment repair x 2  ? KNEE ARTHROSCOPY  April/2013  ? left  ? 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 Procedure: RIGHT TOTAL KNEE ARTHROPLASTY;  Surgeon: Leandrew Koyanagi, MD;  Location: [facility];  Service: Orthopedics;  Laterality: Right;  ? ?Current Outpatient Medications on File Prior to Visit  ?Medication Sig Dispense Refill   ? aspirin EC 81 MG tablet Take 1 tablet (81 mg total) by mouth 2 (two) times daily. (Patient taking differently: Take 81 mg by mouth daily.) 84 tablet 0  ? ?No current facility-administered medications on file prior to visit.  ? ?No Known Allergies ?Social History  ? ?Socioeconomic History  ? Marital status: Married  ?  Spouse name: Not on file  ? Number of children: Not on file  ? Years of education: Not on file  ? Highest education level: Not on file  ?Occupational History  ? Not on file  ?Tobacco Use  ? Smoking status: Every Day  ?  Packs/day: 1.00  ?  Types: Cigarettes  ? Smokeless tobacco: Never  ? Tobacco comments:  ?  pt to call PCP for patch  ?Vaping Use  ? Vaping Use: Never used  ?Substance and Sexual Activity  ? Alcohol use: No  ? Drug use: No  ? Sexual activity: Not on file  ?Other Topics Concern  ? Not on file  ?Social History Narrative  ? Not on file  ? ?Social Determinants of Health  ? ?Financial Resource Strain: Not on file  ?Food Insecurity: Not on file  ?Transportation Needs: Not on file  ?Physical Activity: Not on  file  ?Stress: Not on file  ?Social Connections: Not on file  ?Intimate Partner Violence: Not on file  ? ? ?Past Medical History:  ?Diagnosis Date  ? Cancer [facility])   ? basal cell  ? Cataract   ? Fractured skull ([place])   ? as a child  ? Hyperlipidemia   ? Hypertension   ? Primary localized osteoarthritis of right knee   ? hx of- knee has been replaced  ? Smoker   ? Wears glasses   ? Wears hearing aid in both ears   ? ?Past Surgical History:  ?Procedure Laterality Date  ? BASAL CELL CARCINOMA EXCISION  2016  ? face  ? CATARACT EXTRACTION W/ INTRAOCULAR LENS  IMPLANT, BILATERAL    ? COLONOSCOPY  2008, 2002  ? hx polyps/Dr. Fuller Plan  ? EYE SURGERY    ? retina detachment repair x 2  ? KNEE ARTHROSCOPY  April/2013  ? left  ? 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 Procedure: RIGHT TOTAL KNEE ARTHROPLASTY;  Surgeon: Leandrew Koyanagi, MD;  Location: [facility];  Service: Orthopedics;  Laterality: Right;   ? ?Current Outpatient Medications on File Prior to Visit  ?Medication Sig Dispense Refill  ? aspirin EC 81 MG tablet Take 1 tablet (81 mg total) by mouth 2 (two) times daily. (Patient taking differently: Take 81 mg by mouth daily.) 84 tablet 0  ? ?No current facility-administered medications on file prior to visit.  ? ?No Known Allergies ?Social History  ? ?Socioeconomic History  ? Marital status: Married  ?  Spouse name: Not on file  ? Number of children: Not on file  ? Years of education: Not on file  ? Highest education level: Not on file  ?Occupational History  ? Not on file  ?Tobacco Use  ? Smoking status: Every Day  ?  Packs/day: 1.00  ?  Types: Cigarettes  ? Smokeless tobacco: Never  ? Tobacco comments:  ?  pt to call PCP for patch  ?Vaping Use  ? Vaping Use: Never used  ?Substance and Sexual Activity  ? Alcohol use: No  ? Drug use: No  ? Sexual activity: Not on file  ?Other Topics Concern  ? Not on file  ?Social History Narrative  ? Not on file  ? ?Social Determinants of Health  ? ?Financial Resource Strain: Not on file  ?Food Insecurity: Not on file  ?Transportation Needs: Not on file  ?Physical Activity: Not on file  ?Stress: Not on file  ?Social Connections: Not on file  ?Intimate Partner Violence: Not on file  ? ? ? ? ?Review of Systems  ?All other systems reviewed and are negative. ? ?   ?Objective:  ? Physical Exam ?Vitals reviewed.  ?Constitutional:   ?   General: He is not in acute distress. ?   Appearance: He is well-developed. He is not diaphoretic.  ?HENT:  ?   Head: Normocephalic and atraumatic.  ?   Right Ear: External ear normal.  ?   Left Ear: External ear normal.  ?   Nose: Nose normal.  ?   Mouth/Throat:  ?   Pharynx: No oropharyngeal exudate.  ?Eyes:  ?   Conjunctiva/sclera: Conjunctivae normal.  ?Cardiovascular:  ?   Rate and Rhythm: Normal rate and regular rhythm.  ?   Heart sounds: Normal heart sounds. No murmur heard. ?Pulmonary:  ?   Effort: Pulmonary effort is normal. No respiratory  distress.  ?   Breath sounds: Normal breath sounds. No wheezing  or rales.  ?Abdominal:  ?   General: Bowel sounds are normal. There is no distension.  ?   Palpations: Abdomen is soft.  ?   Tenderness: There is no abdominal tenderness. There is no rebound.  ?Musculoskeletal:  ?   Cervical back: Neck supple.  ?Lymphadenopathy:  ?   Cervical: No cervical adenopathy.  ?Skin: ?   Findings: No erythema or rash.  ? ? ? ? ? ?   ?Assessment & Plan:  ?Benign essential HTN ? ?Pure hypercholesterolemia - Plan: CBC with Differential/Platelet, Lipid panel, COMPLETE METABOLIC PANEL WITH GFR ? ?Prostate cancer screening - Plan: PSA ?Strongly encouraged the patient to quit smoking.  Recommended a CT scan of the lungs to screen for lung cancer.  Check a PSA to screen for prostate cancer.  Return fasting for CBC CMP and fasting lipid panel.  AAA screening is up-to-date.  Encouraged the patient to follow-up with his gastroenterologist to have his next colonoscopy. ?

## 2022-04-12 ENCOUNTER — Other Ambulatory Visit: Payer: 59

## 2022-04-13 LAB — CBC WITH DIFFERENTIAL/PLATELET
Absolute Monocytes: 466 cells/uL (ref 200–950)
Basophils Absolute: 89 cells/uL (ref 0–200)
Basophils Relative: 1.2 %
Eosinophils Absolute: 200 cells/uL (ref 15–500)
Eosinophils Relative: 2.7 %
HCT: 49.6 % (ref 38.5–50.0)
Hemoglobin: 16.7 g/dL (ref 13.2–17.1)
Lymphs Abs: 1628 cells/uL (ref 850–3900)
MCH: 31 pg (ref 27.0–33.0)
MCHC: 33.7 g/dL (ref 32.0–36.0)
MCV: 92.2 fL (ref 80.0–100.0)
MPV: 10.3 fL (ref 7.5–12.5)
Monocytes Relative: 6.3 %
Neutro Abs: 5017 cells/uL (ref 1500–7800)
Neutrophils Relative %: 67.8 %
Platelets: 260 10*3/uL (ref 140–400)
RBC: 5.38 10*6/uL (ref 4.20–5.80)
RDW: 13.1 % (ref 11.0–15.0)
Total Lymphocyte: 22 %
WBC: 7.4 10*3/uL (ref 3.8–10.8)

## 2022-04-13 LAB — COMPLETE METABOLIC PANEL WITH GFR
AG Ratio: 1.8 (calc) (ref 1.0–2.5)
ALT: 19 U/L (ref 9–46)
AST: 18 U/L (ref 10–35)
Albumin: 4.3 g/dL (ref 3.6–5.1)
Alkaline phosphatase (APISO): 65 U/L (ref 35–144)
BUN: 24 mg/dL (ref 7–25)
CO2: 24 mmol/L (ref 20–32)
Calcium: 9.8 mg/dL (ref 8.6–10.3)
Chloride: 99 mmol/L (ref 98–110)
Creat: 1.27 mg/dL (ref 0.70–1.28)
Globulin: 2.4 g/dL (calc) (ref 1.9–3.7)
Glucose, Bld: 95 mg/dL (ref 65–99)
Potassium: 3.9 mmol/L (ref 3.5–5.3)
Sodium: 137 mmol/L (ref 135–146)
Total Bilirubin: 0.7 mg/dL (ref 0.2–1.2)
Total Protein: 6.7 g/dL (ref 6.1–8.1)
eGFR: 59 mL/min/{1.73_m2} — ABNORMAL LOW (ref >=60)

## 2022-04-13 LAB — LIPID PANEL
Cholesterol: 111 mg/dL (ref <200)
HDL: 47 mg/dL (ref >=40)
LDL Cholesterol (Calc): 47 mg/dL (calc)
Non-HDL Cholesterol (Calc): 64 mg/dL (calc) (ref <130)
Total CHOL/HDL Ratio: 2.4 (calc) (ref <5.0)
Triglycerides: 87 mg/dL (ref <150)

## 2022-04-13 LAB — PSA: PSA: 4.15 ng/mL — ABNORMAL HIGH (ref <=4.00)

## 2022-04-22 ENCOUNTER — Encounter: Payer: Self-pay | Admitting: Gastroenterology

## 2022-05-22 ENCOUNTER — Ambulatory Visit (AMBULATORY_SURGERY_CENTER): Payer: Self-pay | Admitting: *Deleted

## 2022-05-22 VITALS — Ht 66.0 in | Wt 158.6 lb

## 2022-05-22 DIAGNOSIS — Z86010 Personal history of colonic polyps: Secondary | ICD-10-CM

## 2022-05-22 MED ORDER — NA SULFATE-K SULFATE-MG SULF 17.5-3.13-1.6 GM/177ML PO SOLN: 1.0000 | Freq: Once | ORAL | 0 refills | Status: AC

## 2022-05-22 NOTE — Progress Notes (Signed)
  No trouble with anesthesia, denies being told they were difficult to intubate, or hx/fam hx of malignant hyperthermia per pt   No egg or soy allergy  No home oxygen use   No medications for weight loss taken  2 day Suprep/Miralax prep given per RAS

## 2022-06-12 ENCOUNTER — Encounter: Payer: Self-pay | Admitting: Gastroenterology

## 2022-06-18 ENCOUNTER — Encounter: Payer: Self-pay | Admitting: Certified Registered Nurse Anesthetist

## 2022-06-19 ENCOUNTER — Encounter: Payer: Self-pay | Admitting: Gastroenterology

## 2022-06-19 ENCOUNTER — Ambulatory Visit (AMBULATORY_SURGERY_CENTER): Payer: Medicare Other | Admitting: Gastroenterology

## 2022-06-19 VITALS — BP 100/61 | HR 63 | Temp 98.8°F | Resp 21 | Ht 66.0 in | Wt 158.0 lb

## 2022-06-19 DIAGNOSIS — D125 Benign neoplasm of sigmoid colon: Secondary | ICD-10-CM

## 2022-06-19 DIAGNOSIS — D124 Benign neoplasm of descending colon: Secondary | ICD-10-CM

## 2022-06-19 DIAGNOSIS — Z86010 Personal history of colonic polyps: Secondary | ICD-10-CM

## 2022-06-19 DIAGNOSIS — Z09 Encounter for follow-up examination after completed treatment for conditions other than malignant neoplasm: Secondary | ICD-10-CM

## 2022-06-19 DIAGNOSIS — D123 Benign neoplasm of transverse colon: Secondary | ICD-10-CM | POA: Diagnosis not present

## 2022-06-19 MED ORDER — SODIUM CHLORIDE 0.9 % IV SOLN: 500.0000 mL | Freq: Once | INTRAVENOUS | Status: DC

## 2022-06-19 NOTE — Progress Notes (Signed)
0933 Pt experienced laryngeal spasm with jaw thrust performed. Vss. MD aware.

## 2022-06-19 NOTE — Op Note (Signed)
[facility] Patient Name: Croy Drumwright Procedure Date: 06/19/2022 9:19 AM MRN: [id number] Endoscopist: Ladene Artist , MD Age: 76 Referring MD:  Date of Birth: 1946/06/12 Gender: Male Account #: [id number] Procedure:                Colonoscopy Indications:              Surveillance: Piecemeal removal of large sessile                            adenoma last colonoscopy (< 3 yrs) Medicines:                Monitored Anesthesia Care Procedure:                Pre-Anesthesia Assessment:                           - Prior to the procedure, a History and Physical                            was performed, and patient medications and                            allergies were reviewed. The patient's tolerance of                            previous anesthesia was also reviewed. The risks                            and benefits of the procedure and the sedation                            options and risks were discussed with the patient.                            All questions were answered, and informed consent                            was obtained. Prior Anticoagulants: The patient has                            taken no previous anticoagulant or antiplatelet                            agents. ASA Grade Assessment: II - A patient with                            mild systemic disease. After reviewing the risks                            and benefits, the patient was deemed in                            satisfactory condition to undergo the procedure.  After obtaining informed consent, the colonoscope                            was passed under direct vision. Throughout the                            procedure, the patient's blood pressure, pulse, and                            oxygen saturations were monitored continuously. The                            Olympus CF-HQ190L [id number]) Colonoscope was                            introduced through the anus and  advanced to the the                            cecum, identified by appendiceal orifice and                            ileocecal valve. The ileocecal valve, appendiceal                            orifice, and rectum were photographed. The quality                            of the bowel preparation was adequate after                            extensive lavage, suction. The colonoscopy was                            performed without difficulty. The patient tolerated                            the procedure well. Scope In: 9:30:38 AM Scope Out: 9:49:30 AM Scope Withdrawal Time: 0 hours 15 minutes 21 seconds  Total Procedure Duration: 0 hours 18 minutes 52 seconds  Findings:                 The perianal and digital rectal examinations were                            normal.                           Multiple medium-sized localized angiodysplastic                            lesions without bleeding were found in the                            ascending colon and in the cecum.  A tattoo was seen in the ascending colon. The                            tattoo site appeared normal.                           Six sessile polyps were found in the sigmoid colon                            (2), descending colon (2) and transverse colon (2).                            The polyps were 6 to 9 mm in size. These polyps                            were removed with a cold snare. Resection and                            retrieval were complete.                           Multiple medium-mouthed diverticula were found in                            the left colon. There was no evidence of                            diverticular bleeding.                           Anal papilla(e) were hypertrophied.                           Internal hemorrhoids were found during                            retroflexion. The hemorrhoids were moderate and                            Grade I (internal  hemorrhoids that do not prolapse).                           The exam was otherwise without abnormality on                            direct and retroflexion views. Complications:            No immediate complications. Estimated blood loss:                            None. Estimated Blood Loss:     Estimated blood loss: none. Impression:               - Multiple non-bleeding colonic angiodysplastic  lesions.                           - A tattoo was seen in the ascending colon. The                            tattoo site appeared normal.                           - Six 6 to 9 mm polyps in the sigmoid colon, in the                            descending colon and in the transverse colon,                            removed with a cold snare. Resected and retrieved.                           - Moderate diverticulosis in the left colon.                           - Anal papilla(e) were hypertrophied.                           - Internal hemorrhoids.                           - The examination was otherwise normal on direct                            and retroflexion views. Recommendation:           - Consider repeat colonoscopy vs no repeat due to                            age after studies are complete for surveillance                            based on pathology results with an extended bowel                            prep.                           - Patient has a contact number available for                            emergencies. The signs and symptoms of potential                            delayed complications were discussed with the                            patient. Return to normal activities tomorrow.  Written discharge instructions were provided to the                            patient.                           - High fiber diet.                           - Continue present medications.                           - Await  pathology results. Ladene Artist, MD 06/19/2022 9:56:25 AM This report has been signed electronically.

## 2022-06-19 NOTE — Progress Notes (Signed)
Pt's states no medical or surgical changes since previsit or office visit. 

## 2022-06-19 NOTE — Progress Notes (Signed)
Report given to PACU, vss 

## 2022-06-19 NOTE — Patient Instructions (Signed)
**  Handouts given on polyps, diverticulosis, hemorrhoids and high fiber diet**   YOU HAD AN ENDOSCOPIC PROCEDURE TODAY AT [facility]:   Refer to the procedure report that was given to you for any specific questions about what was found during the examination.  If the procedure report does not answer your questions, please call your gastroenterologist to clarify.  If you requested that your care partner not be given the details of your procedure findings, then the procedure report has been included in a sealed envelope for you to review at your convenience later.  YOU SHOULD EXPECT: Some feelings of bloating in the abdomen. Passage of more gas than usual.  Walking can help get rid of the air that was put into your GI tract during the procedure and reduce the bloating. If you had a lower endoscopy (such as a colonoscopy or flexible sigmoidoscopy) you may notice spotting of blood in your stool or on the toilet paper. If you underwent a bowel prep for your procedure, you may not have a normal bowel movement for a few days.  Please Note:  You might notice some irritation and congestion in your nose or some drainage.  This is from the oxygen used during your procedure.  There is no need for concern and it should clear up in a day or so.  SYMPTOMS TO REPORT IMMEDIATELY:  Following lower endoscopy (colonoscopy or flexible sigmoidoscopy):  Excessive amounts of blood in the stool  Significant tenderness or worsening of abdominal pains  Swelling of the abdomen that is new, acute  Fever of 100F or higher  For urgent or emergent issues, a gastroenterologist can be reached at any hour by calling [phone]. Do not use MyChart messaging for urgent concerns.    DIET:  We do recommend a small meal at first, but then you may proceed to your regular diet.  Drink plenty of fluids but you should avoid alcoholic beverages for 24 hours.  ACTIVITY:  You should plan to take it easy for the rest  of today and you should NOT DRIVE or use heavy machinery until tomorrow (because of the sedation medicines used during the test).    FOLLOW UP: Our staff will call the number listed on your records the next business day following your procedure.  We will call around 7:15- 8:00 am to check on you and address any questions or concerns that you may have regarding the information given to you following your procedure. If we do not reach you, we will leave a message.  If you develop any symptoms (ie: fever, flu-like symptoms, shortness of breath, cough etc.) before then, please call [phone].  If you test positive for Covid 19 in the 2 weeks post procedure, please call and report this information to [place].    If any biopsies were taken you will be contacted by phone or by letter within the next 1-3 weeks.  Please call us at [phone] if you have not heard about the biopsies in 3 weeks.    SIGNATURES/CONFIDENTIALITY: You and/or your care partner have signed paperwork which will be entered into your electronic medical record.  These signatures attest to the fact that that the information above on your After Visit Summary has been reviewed and is understood.  Full responsibility of the confidentiality of this discharge information lies with you and/or your care-partner.

## 2022-06-19 NOTE — Progress Notes (Signed)
History & Physical  Primary Care Physician:  Susy Frizzle, MD Primary Gastroenterologist: Lucio Edward, MD  CHIEF COMPLAINT:  Personal history of colon polyps   HPI: Nicholas Huffman is a 76 y.o. male with a personal history of multiple adenomatous colon polyps with piecemeal polypectomy performed 2 years ago for colonoscopy.   Past Medical History:  Diagnosis Date   Cancer ([place])    basal cell   Cataract    Fractured skull ([place])    as a child   Hyperlipidemia    Hypertension    Primary localized osteoarthritis of right knee    hx of- knee has been replaced   Smoker    Wears glasses    Wears hearing aid in both ears     Past Surgical History:  Procedure Laterality Date   BASAL CELL CARCINOMA EXCISION  2016   face   CATARACT EXTRACTION W/ INTRAOCULAR LENS  IMPLANT, BILATERAL     COLONOSCOPY  2008, 2002   hx polyps/Dr. 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 Authorizing Provider  amLODipine (NORVASC) 10 MG tablet Take 1 tablet (10 mg total) by mouth daily. 04/11/22  Yes Susy Frizzle, MD  aspirin EC 81 MG tablet Take 1 tablet (81 mg total) by mouth 2 (two) times daily. Patient taking differently: Take 81 mg by mouth daily. 03/26/18  Yes Leandrew Koyanagi, MD  atorvastatin (LIPITOR) 40 MG tablet Take 1 tablet (40 mg total) by mouth daily. 04/11/22  Yes Susy Frizzle, MD  losartan-hydrochlorothiazide (HYZAAR) 100-25 MG tablet Take 1 tablet by mouth daily. 04/11/22  Yes Susy Frizzle, MD    Current Outpatient Medications  Medication Sig Dispense Refill   amLODipine (NORVASC) 10 MG tablet Take 1 tablet (10 mg total) by mouth daily. 90 tablet 3   aspirin EC 81 MG  tablet Take 1 tablet (81 mg total) by mouth 2 (two) times daily. (Patient taking differently: Take 81 mg by mouth daily.) 84 tablet 0   atorvastatin (LIPITOR) 40 MG tablet Take 1 tablet (40 mg total) by mouth daily. 90 tablet 3   losartan-hydrochlorothiazide (HYZAAR) 100-25 MG tablet Take 1 tablet by mouth daily. 90 tablet 3   Current Facility-Administered Medications  Medication Dose Route Frequency Provider Last Rate Last Admin   0.9 %  sodium chloride infusion  500 mL Intravenous Once Ladene Artist, MD        Allergies as of 06/19/2022   (No Known Allergies)    Family History  Problem Relation Age of Onset   Breast cancer Mother    Colon cancer Neg Hx    Esophageal cancer Neg Hx    Rectal cancer Neg Hx    Stomach cancer Neg Hx     Social History   Socioeconomic History   Marital status: Married    Spouse name: Not on file   Number of children: Not on file   Years of education: Not on file   Highest education level: Not on file  Occupational History   Not on file  Tobacco Use   Smoking status: Every Day    Packs/day: 1.00  Types: Cigarettes   Smokeless tobacco: Never   Tobacco comments:    pt to call PCP for patch  Vaping Use   Vaping Use: Never used  Substance and Sexual Activity   Alcohol use: No   Drug use: No   Sexual activity: Not on file  Other Topics Concern   Not on file  Social History Narrative   Not on file   Social Determinants of Health   Financial Resource Strain: Not on file  Food Insecurity: Not on file  Transportation Needs: Not on file  Physical Activity: Not on file  Stress: Not on file  Social Connections: Not on file  Intimate Partner Violence: Not on file    Review of Systems:  All systems reviewed were negative except where noted in HPI.   Physical Exam: General:  Alert, well-developed, in NAD Head:  Normocephalic and atraumatic. Eyes:  Sclera clear, no icterus.   Conjunctiva pink. Ears:  Normal auditory  acuity. Mouth:  No deformity or lesions.  Neck:  Supple; no masses . Lungs:  Clear throughout to auscultation.   No wheezes, crackles, or rhonchi. No acute distress. Heart:  Regular rate and rhythm; no murmurs. Abdomen:  Soft, nondistended, nontender. No masses, hepatomegaly. No obvious masses.  Normal bowel .    Rectal:  Deferred   Msk:  Symmetrical without gross deformities.. Pulses:  Normal pulses noted. Extremities:  Without edema. Neurologic:  Alert and  oriented x4;  grossly normal neurologically. Skin:  Intact without significant lesions or rashes. Cervical Nodes:  No significant cervical adenopathy. Psych:  Alert and cooperative. Normal mood and affect.   Impression / Plan:   Personal history of multiple adenomatous colon polyps with piecemeal polypectomy performed 2 years ago for colonoscopy.  Pricilla Riffle. Fuller Plan  06/19/2022, 9:23 AM See Shea Evans, [facility], to contact our on call provider

## 2022-06-19 NOTE — Progress Notes (Signed)
Called to room to assist during endoscopic procedure.  Patient ID and intended procedure confirmed with present staff. Received instructions for my participation in the procedure from the performing physician.  

## 2022-06-20 ENCOUNTER — Telehealth: Payer: Self-pay

## 2022-06-20 NOTE — Telephone Encounter (Signed)
Left message on follow up call. 

## 2022-07-02 ENCOUNTER — Encounter: Payer: Self-pay | Admitting: Gastroenterology

## 2022-07-16 ENCOUNTER — Ambulatory Visit (HOSPITAL_COMMUNITY)
Admission: EM | Admit: 2022-07-16 | Discharge: 2022-07-16 | Disposition: A | Discharge status: Home / Self Care | Payer: Medicare Other | Attending: Family Medicine | Admitting: Family Medicine

## 2022-07-16 ENCOUNTER — Inpatient Hospital Stay (HOSPITAL_COMMUNITY)
Admission: EM | Admit: 2022-07-16 | Discharge: 2022-07-23 | DRG: 329 | Disposition: A | Discharge status: Home / Self Care | Payer: Medicare Other | Attending: General Surgery | Admitting: General Surgery

## 2022-07-16 ENCOUNTER — Other Ambulatory Visit: Payer: Self-pay

## 2022-07-16 ENCOUNTER — Encounter (HOSPITAL_COMMUNITY): Payer: Self-pay | Admitting: Emergency Medicine

## 2022-07-16 ENCOUNTER — Encounter (HOSPITAL_COMMUNITY): Admission: EM | Disposition: A | Discharge status: Home / Self Care | Payer: Self-pay | Source: Home / Self Care

## 2022-07-16 ENCOUNTER — Emergency Department (EMERGENCY_DEPARTMENT_HOSPITAL): Payer: Medicare Other | Admitting: Anesthesiology

## 2022-07-16 ENCOUNTER — Ambulatory Visit (INDEPENDENT_AMBULATORY_CARE_PROVIDER_SITE_OTHER): Payer: Medicare Other

## 2022-07-16 ENCOUNTER — Emergency Department (HOSPITAL_COMMUNITY): Payer: Medicare Other

## 2022-07-16 ENCOUNTER — Emergency Department (HOSPITAL_COMMUNITY): Payer: Medicare Other | Admitting: Anesthesiology

## 2022-07-16 DIAGNOSIS — Z720 Tobacco use: Secondary | ICD-10-CM | POA: Diagnosis not present

## 2022-07-16 DIAGNOSIS — R109 Unspecified abdominal pain: Secondary | ICD-10-CM | POA: Diagnosis not present

## 2022-07-16 DIAGNOSIS — K5720 Diverticulitis of large intestine with perforation and abscess without bleeding: Secondary | ICD-10-CM | POA: Diagnosis present

## 2022-07-16 DIAGNOSIS — K567 Ileus, unspecified: Secondary | ICD-10-CM | POA: Diagnosis not present

## 2022-07-16 DIAGNOSIS — I4819 Other persistent atrial fibrillation: Secondary | ICD-10-CM | POA: Diagnosis not present

## 2022-07-16 DIAGNOSIS — F17210 Nicotine dependence, cigarettes, uncomplicated: Secondary | ICD-10-CM | POA: Diagnosis present

## 2022-07-16 DIAGNOSIS — K631 Perforation of intestine (nontraumatic): Secondary | ICD-10-CM | POA: Diagnosis not present

## 2022-07-16 DIAGNOSIS — Z9889 Other specified postprocedural states: Secondary | ICD-10-CM

## 2022-07-16 DIAGNOSIS — Z86010 Personal history of colonic polyps: Secondary | ICD-10-CM | POA: Diagnosis not present

## 2022-07-16 DIAGNOSIS — Z9841 Cataract extraction status, right eye: Secondary | ICD-10-CM

## 2022-07-16 DIAGNOSIS — I480 Paroxysmal atrial fibrillation: Secondary | ICD-10-CM | POA: Diagnosis not present

## 2022-07-16 DIAGNOSIS — Z85828 Personal history of other malignant neoplasm of skin: Secondary | ICD-10-CM

## 2022-07-16 DIAGNOSIS — I4891 Unspecified atrial fibrillation: Secondary | ICD-10-CM | POA: Diagnosis not present

## 2022-07-16 DIAGNOSIS — Z7982 Long term (current) use of aspirin: Secondary | ICD-10-CM | POA: Diagnosis not present

## 2022-07-16 DIAGNOSIS — Z9842 Cataract extraction status, left eye: Secondary | ICD-10-CM | POA: Diagnosis not present

## 2022-07-16 DIAGNOSIS — E785 Hyperlipidemia, unspecified: Secondary | ICD-10-CM | POA: Diagnosis present

## 2022-07-16 DIAGNOSIS — Z96651 Presence of right artificial knee joint: Secondary | ICD-10-CM | POA: Diagnosis present

## 2022-07-16 DIAGNOSIS — I110 Hypertensive heart disease with heart failure: Secondary | ICD-10-CM | POA: Diagnosis present

## 2022-07-16 DIAGNOSIS — Z79899 Other long term (current) drug therapy: Secondary | ICD-10-CM | POA: Diagnosis not present

## 2022-07-16 DIAGNOSIS — Z961 Presence of intraocular lens: Secondary | ICD-10-CM | POA: Diagnosis present

## 2022-07-16 DIAGNOSIS — K658 Other peritonitis: Secondary | ICD-10-CM | POA: Diagnosis present

## 2022-07-16 DIAGNOSIS — N179 Acute kidney failure, unspecified: Secondary | ICD-10-CM | POA: Diagnosis present

## 2022-07-16 DIAGNOSIS — R1030 Lower abdominal pain, unspecified: Secondary | ICD-10-CM

## 2022-07-16 DIAGNOSIS — I10 Essential (primary) hypertension: Secondary | ICD-10-CM | POA: Diagnosis not present

## 2022-07-16 HISTORY — PX: LAPAROTOMY: SHX154

## 2022-07-16 LAB — COMPREHENSIVE METABOLIC PANEL
ALT: 18 U/L (ref 0–44)
AST: 19 U/L (ref 15–41)
Albumin: 3.5 g/dL (ref 3.5–5.0)
Alkaline Phosphatase: 67 U/L (ref 38–126)
Anion gap: 10 (ref 5–15)
BUN: 29 mg/dL — ABNORMAL HIGH (ref 8–23)
CO2: 23 mmol/L (ref 22–32)
Calcium: 8.9 mg/dL (ref 8.9–10.3)
Chloride: 99 mmol/L (ref 98–111)
Creatinine, Ser: 1.46 mg/dL — ABNORMAL HIGH (ref 0.61–1.24)
GFR, Estimated: 50 mL/min — ABNORMAL LOW (ref >60)
Glucose, Bld: 131 mg/dL — ABNORMAL HIGH (ref 70–99)
Potassium: 3.2 mmol/L — ABNORMAL LOW (ref 3.5–5.1)
Sodium: 132 mmol/L — ABNORMAL LOW (ref 135–145)
Total Bilirubin: 1 mg/dL (ref 0.3–1.2)
Total Protein: 6.5 g/dL (ref 6.5–8.1)

## 2022-07-16 LAB — URINALYSIS, ROUTINE W REFLEX MICROSCOPIC
Bilirubin Urine: NEGATIVE
Glucose, UA: NEGATIVE mg/dL
Ketones, ur: NEGATIVE mg/dL
Leukocytes,Ua: NEGATIVE
Nitrite: NEGATIVE
Protein, ur: 30 mg/dL — AB
Specific Gravity, Urine: 1.017 (ref 1.005–1.030)
pH: 5 (ref 5.0–8.0)

## 2022-07-16 LAB — CBC
HCT: 43.9 % (ref 39.0–52.0)
Hemoglobin: 15.2 g/dL (ref 13.0–17.0)
MCH: 31 pg (ref 26.0–34.0)
MCHC: 34.6 g/dL (ref 30.0–36.0)
MCV: 89.4 fL (ref 80.0–100.0)
Platelets: 212 10*3/uL (ref 150–400)
RBC: 4.91 MIL/uL (ref 4.22–5.81)
RDW: 14.6 % (ref 11.5–15.5)
WBC: 22.3 10*3/uL — ABNORMAL HIGH (ref 4.0–10.5)
nRBC: 0 % (ref 0.0–0.2)

## 2022-07-16 LAB — LIPASE, BLOOD: Lipase: 34 U/L (ref 11–51)

## 2022-07-16 SURGERY — LAPAROTOMY, EXPLORATORY
Anesthesia: General

## 2022-07-16 MED ORDER — ROCURONIUM BROMIDE 10 MG/ML (PF) SYRINGE
PREFILLED_SYRINGE | INTRAVENOUS | Status: AC
  Filled 2022-07-16: qty 10

## 2022-07-16 MED ORDER — PHENYLEPHRINE HCL (PRESSORS) 10 MG/ML IV SOLN
INTRAVENOUS | Status: DC | PRN
  Administered 2022-07-16: 80 ug via INTRAVENOUS

## 2022-07-16 MED ORDER — 0.9 % SODIUM CHLORIDE (POUR BTL) OPTIME
TOPICAL | Status: DC | PRN
  Administered 2022-07-16 (×4): 1000 mL

## 2022-07-16 MED ORDER — SODIUM CHLORIDE 0.9 % IV BOLUS
500.0000 mL | Freq: Once | INTRAVENOUS | Status: AC
  Administered 2022-07-16: 500 mL via INTRAVENOUS

## 2022-07-16 MED ORDER — SUGAMMADEX SODIUM 200 MG/2ML IV SOLN
INTRAVENOUS | Status: DC | PRN
  Administered 2022-07-16: 200 mg via INTRAVENOUS

## 2022-07-16 MED ORDER — PHENYLEPHRINE 80 MCG/ML (10ML) SYRINGE FOR IV PUSH (FOR BLOOD PRESSURE SUPPORT)
PREFILLED_SYRINGE | INTRAVENOUS | Status: AC
  Filled 2022-07-16: qty 10

## 2022-07-16 MED ORDER — DEXAMETHASONE SODIUM PHOSPHATE 10 MG/ML IJ SOLN
INTRAMUSCULAR | Status: AC
  Filled 2022-07-16: qty 1

## 2022-07-16 MED ORDER — PROPOFOL 10 MG/ML IV BOLUS
INTRAVENOUS | Status: AC
  Filled 2022-07-16: qty 20

## 2022-07-16 MED ORDER — PHENYLEPHRINE HCL-NACL 20-0.9 MG/250ML-% IV SOLN
INTRAVENOUS | Status: DC | PRN
  Administered 2022-07-16: 40 ug/min via INTRAVENOUS

## 2022-07-16 MED ORDER — ROCURONIUM BROMIDE 100 MG/10ML IV SOLN
INTRAVENOUS | Status: DC | PRN
  Administered 2022-07-16: 50 mg via INTRAVENOUS

## 2022-07-16 MED ORDER — PIPERACILLIN-TAZOBACTAM 3.375 G IVPB 30 MIN
3.3750 g | Freq: Once | INTRAVENOUS | Status: AC
  Administered 2022-07-16: 3.375 g via INTRAVENOUS
  Filled 2022-07-16: qty 50

## 2022-07-16 MED ORDER — MIDAZOLAM HCL 5 MG/5ML IJ SOLN
INTRAMUSCULAR | Status: DC | PRN
  Administered 2022-07-16: 1 mg via INTRAVENOUS

## 2022-07-16 MED ORDER — ONDANSETRON HCL 4 MG/2ML IJ SOLN
4.0000 mg | Freq: Once | INTRAMUSCULAR | Status: AC
  Administered 2022-07-16: 4 mg via INTRAVENOUS
  Filled 2022-07-16: qty 2

## 2022-07-16 MED ORDER — SUCCINYLCHOLINE CHLORIDE 200 MG/10ML IV SOSY
PREFILLED_SYRINGE | INTRAVENOUS | Status: AC
  Filled 2022-07-16: qty 10

## 2022-07-16 MED ORDER — ONDANSETRON HCL 4 MG/2ML IJ SOLN
INTRAMUSCULAR | Status: DC | PRN
  Administered 2022-07-16: 4 mg via INTRAVENOUS

## 2022-07-16 MED ORDER — HYDROMORPHONE HCL 1 MG/ML IJ SOLN
0.2500 mg | INTRAMUSCULAR | Status: DC | PRN
  Administered 2022-07-17: 0.5 mg via INTRAVENOUS

## 2022-07-16 MED ORDER — SUCCINYLCHOLINE CHLORIDE 200 MG/10ML IV SOSY
PREFILLED_SYRINGE | INTRAVENOUS | Status: DC | PRN
  Administered 2022-07-16: 120 mg via INTRAVENOUS
  Administered 2022-07-16: 20 mg via INTRAVENOUS

## 2022-07-16 MED ORDER — ONDANSETRON HCL 4 MG/2ML IJ SOLN
INTRAMUSCULAR | Status: AC
  Filled 2022-07-16: qty 2

## 2022-07-16 MED ORDER — DEXAMETHASONE SODIUM PHOSPHATE 10 MG/ML IJ SOLN
INTRAMUSCULAR | Status: DC | PRN
  Administered 2022-07-16: 10 mg via INTRAVENOUS

## 2022-07-16 MED ORDER — ACETAMINOPHEN 10 MG/ML IV SOLN
INTRAVENOUS | Status: DC | PRN
  Administered 2022-07-16: 1000 mg via INTRAVENOUS

## 2022-07-16 MED ORDER — DIPHENHYDRAMINE HCL 50 MG/ML IJ SOLN
INTRAMUSCULAR | Status: DC | PRN
  Administered 2022-07-16: 12.5 mg via INTRAVENOUS

## 2022-07-16 MED ORDER — ARTIFICIAL TEARS OPHTHALMIC OINT
TOPICAL_OINTMENT | OPHTHALMIC | Status: AC
  Filled 2022-07-16: qty 3.5

## 2022-07-16 MED ORDER — LIDOCAINE HCL (CARDIAC) PF 100 MG/5ML IV SOSY
PREFILLED_SYRINGE | INTRAVENOUS | Status: DC | PRN
  Administered 2022-07-16: 60 mg via INTRATRACHEAL

## 2022-07-16 MED ORDER — PROPOFOL 10 MG/ML IV BOLUS
INTRAVENOUS | Status: DC | PRN
  Administered 2022-07-16: 100 mg via INTRAVENOUS

## 2022-07-16 MED ORDER — EPHEDRINE 5 MG/ML INJ
INTRAVENOUS | Status: AC
  Filled 2022-07-16: qty 5

## 2022-07-16 MED ORDER — FENTANYL CITRATE (PF) 250 MCG/5ML IJ SOLN
INTRAMUSCULAR | Status: AC
  Filled 2022-07-16: qty 5

## 2022-07-16 MED ORDER — DIPHENHYDRAMINE HCL 50 MG/ML IJ SOLN
INTRAMUSCULAR | Status: AC
  Filled 2022-07-16: qty 1

## 2022-07-16 MED ORDER — LIDOCAINE 2% (20 MG/ML) 5 ML SYRINGE
INTRAMUSCULAR | Status: AC
  Filled 2022-07-16: qty 5

## 2022-07-16 MED ORDER — FENTANYL CITRATE PF 50 MCG/ML IJ SOSY
50.0000 ug | PREFILLED_SYRINGE | Freq: Once | INTRAMUSCULAR | Status: AC
  Administered 2022-07-16: 50 ug via INTRAVENOUS
  Filled 2022-07-16: qty 1

## 2022-07-16 MED ORDER — ACETAMINOPHEN 10 MG/ML IV SOLN
INTRAVENOUS | Status: AC
  Filled 2022-07-16: qty 100

## 2022-07-16 MED ORDER — IOHEXOL 300 MG/ML  SOLN
100.0000 mL | Freq: Once | INTRAMUSCULAR | Status: AC | PRN
  Administered 2022-07-16: 100 mL via INTRAVENOUS

## 2022-07-16 MED ORDER — MIDAZOLAM HCL 2 MG/2ML IJ SOLN
INTRAMUSCULAR | Status: AC
  Filled 2022-07-16: qty 2

## 2022-07-16 MED ORDER — LACTATED RINGERS IV SOLN: INTRAVENOUS | Status: DC | PRN

## 2022-07-16 MED ORDER — FENTANYL CITRATE (PF) 250 MCG/5ML IJ SOLN
INTRAMUSCULAR | Status: DC | PRN
  Administered 2022-07-16 (×4): 50 ug via INTRAVENOUS

## 2022-07-16 MED ORDER — ALBUMIN HUMAN 5 % IV SOLN: INTRAVENOUS | Status: DC | PRN

## 2022-07-16 SURGICAL SUPPLY — 57 items
APL PRP STRL LF DISP 70% ISPRP (MISCELLANEOUS) ×1
BAG COUNTER SPONGE SURGICOUNT (BAG) ×2 IMPLANT
BAG SPNG CNTER NS LX DISP (BAG) ×1
BINDER ABDOMINAL 12 ML 46-62 (SOFTGOODS) ×1 IMPLANT
BIOPATCH RED 1 DISK 7.0 (GAUZE/BANDAGES/DRESSINGS) ×1 IMPLANT
BLADE CLIPPER SURG (BLADE) ×1 IMPLANT
CANISTER SUCT 3000ML PPV (MISCELLANEOUS) ×3 IMPLANT
CHLORAPREP W/TINT 26 (MISCELLANEOUS) ×2 IMPLANT
COVER SURGICAL LIGHT HANDLE (MISCELLANEOUS) ×2 IMPLANT
DRAIN CHANNEL 19F RND (DRAIN) ×1 IMPLANT
DRAPE LAPAROSCOPIC ABDOMINAL (DRAPES) ×2 IMPLANT
DRAPE WARM FLUID 44X44 (DRAPES) ×2 IMPLANT
DRSG OPSITE POSTOP 4X10 (GAUZE/BANDAGES/DRESSINGS) IMPLANT
DRSG OPSITE POSTOP 4X8 (GAUZE/BANDAGES/DRESSINGS) IMPLANT
DRSG PAD ABDOMINAL 8X10 ST (GAUZE/BANDAGES/DRESSINGS) ×1 IMPLANT
DRSG TEGADERM 4X4.75 (GAUZE/BANDAGES/DRESSINGS) ×1 IMPLANT
ELECT BLADE 6.5 EXT (BLADE) IMPLANT
ELECT CAUTERY BLADE 6.4 (BLADE) ×2 IMPLANT
ELECT REM PT RETURN 9FT ADLT (ELECTROSURGICAL) ×2
ELECTRODE REM PT RTRN 9FT ADLT (ELECTROSURGICAL) ×1 IMPLANT
EVACUATOR SILICONE 100CC (DRAIN) ×1 IMPLANT
GAUZE SPONGE 4X4 12PLY STRL (GAUZE/BANDAGES/DRESSINGS) ×1 IMPLANT
GLOVE BIOGEL M STRL SZ7.5 (GLOVE) ×2 IMPLANT
GLOVE INDICATOR 8.0 STRL GRN (GLOVE) ×4 IMPLANT
GOWN STRL REUS W/ TWL LRG LVL3 (GOWN DISPOSABLE) ×1 IMPLANT
GOWN STRL REUS W/TWL 2XL LVL3 (GOWN DISPOSABLE) ×2 IMPLANT
GOWN STRL REUS W/TWL LRG LVL3 (GOWN DISPOSABLE) ×2
HANDLE SUCTION POOLE (INSTRUMENTS) ×1 IMPLANT
KIT BASIN OR (CUSTOM PROCEDURE TRAY) ×2 IMPLANT
KIT OSTOMY DRAINABLE 2.75 STR (WOUND CARE) ×1 IMPLANT
KIT TURNOVER KIT B (KITS) ×2 IMPLANT
LIGASURE IMPACT 36 18CM CVD LR (INSTRUMENTS) ×1 IMPLANT
NS IRRIG 1000ML POUR BTL (IV SOLUTION) ×4 IMPLANT
PACK GENERAL/GYN (CUSTOM PROCEDURE TRAY) ×2 IMPLANT
PAD ARMBOARD 7.5X6 YLW CONV (MISCELLANEOUS) ×2 IMPLANT
PENCIL SMOKE EVACUATOR (MISCELLANEOUS) ×2 IMPLANT
RELOAD GRN CONTOUR (ENDOMECHANICALS) ×2 IMPLANT
RELOAD STAPLE 40 GRN THCK (ENDOMECHANICALS) IMPLANT
RETRACTOR WND ALEXIS 25 LRG (MISCELLANEOUS) IMPLANT
RTRCTR WOUND ALEXIS 25CM LRG (MISCELLANEOUS) ×2
SPECIMEN JAR LARGE (MISCELLANEOUS) IMPLANT
SPONGE T-LAP 18X18 ~~LOC~~+RFID (SPONGE) IMPLANT
STAPLER CVD CUT GN 40 RELOAD (ENDOMECHANICALS) ×4 IMPLANT
STAPLER CVD CUT GRN 40 RELOAD (ENDOMECHANICALS) IMPLANT
STAPLER VISISTAT 35W (STAPLE) ×1 IMPLANT
SUCTION POOLE HANDLE (INSTRUMENTS) ×2
SUT ETHILON 2 0 FS 18 (SUTURE) ×1 IMPLANT
SUT PDS AB 1 TP1 96 (SUTURE) ×4 IMPLANT
SUT PROLENE 2 0 SH 30 (SUTURE) ×1 IMPLANT
SUT SILK 2 0 SH CR/8 (SUTURE) ×2 IMPLANT
SUT SILK 2 0 TIES 10X30 (SUTURE) ×2 IMPLANT
SUT SILK 3 0 SH CR/8 (SUTURE) ×2 IMPLANT
SUT SILK 3 0 TIES 10X30 (SUTURE) ×2 IMPLANT
SUT VIC AB 3-0 SH 18 (SUTURE) ×2 IMPLANT
TOWEL GREEN STERILE (TOWEL DISPOSABLE) ×1 IMPLANT
TRAY FOLEY MTR SLVR 16FR STAT (SET/KITS/TRAYS/PACK) ×1 IMPLANT
YANKAUER SUCT BULB TIP NO VENT (SUCTIONS) IMPLANT

## 2022-07-16 NOTE — Anesthesia Procedure Notes (Signed)
Procedure Name: Intubation Date/Time: 07/16/2022 9:47 PM  Performed by: Clovis Cao, CRNAPre-anesthesia Checklist: Patient identified, Emergency Drugs available, Suction available and Patient being monitored Patient Re-evaluated:Patient Re-evaluated prior to induction Oxygen Delivery Method: Circle system utilized Preoxygenation: Pre-oxygenation with 100% oxygen Induction Type: IV induction, Rapid sequence and Cricoid Pressure applied Laryngoscope Size: Miller and 2 Grade View: Grade I Tube type: Oral Tube size: 7.5 mm Number of attempts: 1 Airway Equipment and Method: Stylet Placement Confirmation: ETT inserted through vocal cords under direct vision, positive ETCO2 and breath sounds checked- equal and bilateral Secured at: 23 cm Tube secured with: Tape Dental Injury: Teeth and Oropharynx as per pre-operative assessment

## 2022-07-16 NOTE — ED Provider Notes (Signed)
[facility] EMERGENCY DEPARTMENT Provider Note   CSN: [id number] Arrival date & time: 07/16/22  1128     History  Chief Complaint  Patient presents with   Abdominal Pain    Nicholas Huffman is a 76 y.o. male.  Nicholas Huffman is a 76 y.o. male with a history of hypertension, hyperlipidemia and tobacco abuse, who presents to the ED for lower abdominal pain.  Pain started 3 days ago.  He reports it is present across his lower abdomen.  He has had some nausea with 2 episodes of vomiting.  Also reports 1 episode of nonbloody diarrhea.  He denies fevers or chills.  His wife does not have similar symptoms.  No recent new foods or travel, no recent antibiotics.  He was seen at urgent care and advised to come in for further evaluation.  No prior abdominal surgeries.  The history is provided by the patient and the spouse.  Abdominal Pain Associated symptoms: diarrhea, nausea and vomiting   Associated symptoms: no chills and no fever        Home Medications Prior to Admission medications   Medication Sig Start Date End Date Taking? Authorizing Provider  amLODipine (NORVASC) 10 MG tablet Take 1 tablet (10 mg total) by mouth daily. 04/11/22   Susy Frizzle, MD  aspirin EC 81 MG tablet Take 1 tablet (81 mg total) by mouth 2 (two) times daily. Patient taking differently: Take 81 mg by mouth daily. 03/26/18   Leandrew Koyanagi, MD  atorvastatin (LIPITOR) 40 MG tablet Take 1 tablet (40 mg total) by mouth daily. 04/11/22   Susy Frizzle, MD  losartan-hydrochlorothiazide (HYZAAR) 100-25 MG tablet Take 1 tablet by mouth daily. 04/11/22   Susy Frizzle, MD      Allergies    Patient has no known allergies.    Review of Systems   Review of Systems  Constitutional:  Negative for chills and fever.  Gastrointestinal:  Positive for abdominal pain, diarrhea, nausea and vomiting.    Physical Exam Updated Vital Signs BP 126/65   Pulse 88   Temp 98.6 F (37 C) (Oral)   Resp 18    SpO2 94%  Physical Exam Vitals and nursing note reviewed.  Constitutional:      General: He is not in acute distress.    Appearance: Normal appearance. He is well-developed. He is not ill-appearing or diaphoretic.  HENT:     Head: Normocephalic and atraumatic.  Eyes:     General:        Right eye: No discharge.        Left eye: No discharge.     Pupils: Pupils are equal, round, and reactive to light.  Cardiovascular:     Rate and Rhythm: Normal rate and regular rhythm.     Pulses: Normal pulses.     Heart sounds: Normal heart sounds.  Pulmonary:     Effort: Pulmonary effort is normal. No respiratory distress.     Breath sounds: Normal breath sounds. No wheezing or rales.     Comments: Respirations equal and unlabored, patient able to speak in full sentences, lungs clear to auscultation bilaterally  Abdominal:     General: Bowel sounds are normal. There is no distension.     Palpations: Abdomen is soft. There is no mass.     Tenderness: There is abdominal tenderness in the right lower quadrant, suprapubic area and left lower quadrant. There is guarding.     Comments: Abdomen  soft, nondistended, bowel sounds present, there is some very mild upper abdominal tenderness with slight guarding, diffuse tenderness to even light palpation across the lower abdomen with some guarding.  Musculoskeletal:        General: No deformity.     Cervical back: Neck supple.  Skin:    General: Skin is warm and dry.     Capillary Refill: Capillary refill takes less than 2 seconds.  Neurological:     Mental Status: He is alert and oriented to person, place, and time.     Coordination: Coordination normal.     Comments: Speech is clear, able to follow commands Moves extremities without ataxia, coordination intact  Psychiatric:        Mood and Affect: Mood normal.        Behavior: Behavior normal.     ED Results / Procedures / Treatments   Labs (all labs ordered are listed, but only abnormal results  are displayed) Labs Reviewed  COMPREHENSIVE METABOLIC PANEL - Abnormal; Notable for the following components:      Result Value   Sodium 132 (*)    Potassium 3.2 (*)    Glucose, Bld 131 (*)    BUN 29 (*)    Creatinine, Ser 1.46 (*)    GFR, Estimated 50 (*)    All other components within normal limits  CBC - Abnormal; Notable for the following components:   WBC 22.3 (*)    All other components within normal limits  URINALYSIS, ROUTINE W REFLEX MICROSCOPIC - Abnormal; Notable for the following components:   Hgb urine dipstick SMALL (*)    Protein, ur 30 (*)    Bacteria, UA RARE (*)    All other components within normal limits  LIPASE, BLOOD    EKG None  Radiology DG Abd 2 Views  Result Date: 07/16/2022 CLINICAL DATA:  Abdominal pain. EXAM: ABDOMEN - 2 VIEW COMPARISON:  None Available. FINDINGS: The bowel gas pattern is normal. There is no evidence of free air. No radio-opaque calculi or other significant radiographic abnormality is seen. IMPRESSION: Negative. Electronically Signed   By: Marijo Conception M.D.   On: 07/16/2022 10:50    Procedures .Critical Care  Performed by: Jacqlyn Larsen, PA-C Authorized by: Jacqlyn Larsen, PA-C   Critical care provider statement:    Critical care time (minutes):  45   Critical care was necessary to treat or prevent imminent or life-threatening deterioration of the following conditions: Perforated diverticulitis.   Critical care was time spent personally by me on the following activities:  Development of treatment plan with patient or surrogate, discussions with consultants, evaluation of patient's response to treatment, examination of patient, ordering and review of laboratory studies, ordering and review of radiographic studies, ordering and performing treatments and interventions, pulse oximetry, re-evaluation of patient's condition and review of old charts     Medications Ordered in ED Medications  sodium chloride 0.9 % bolus 500 mL (500  mLs Intravenous New Bag/Given 07/16/22 1740)  ondansetron (ZOFRAN) injection 4 mg (4 mg Intravenous Given 07/16/22 1743)  fentaNYL (SUBLIMAZE) injection 50 mcg (50 mcg Intravenous Given 07/16/22 1744)  iohexol (OMNIPAQUE) 300 MG/ML solution 100 mL (100 mLs Intravenous Contrast Given 07/16/22 1832)    ED Course/ Medical Decision Making/ A&P                           Medical Decision Making Amount and/or Complexity of Data Reviewed Labs: ordered.  Risk Prescription drug management. Decision regarding hospitalization.   Nicholas Huffman is a 76 y.o. male presents to the ED for concern of abdominal pain, this involves an extensive number of treatment options, and is a complaint that carries with it a high risk of complications and morbidity.  The differential diagnosis includes diverticulitis, appendicitis, colitis, obstruction, perforation, UTI, nephrolithiasis   Additional history obtained:  Additional history obtained from spouse at bedside External records from outside source obtained and reviewed including urgent care note from earlier today   Lab Tests:  I Ordered, reviewed, and interpreted labs.  The pertinent results include:  Leukocytosis of 22.3, very mild hyponatremia and hypochloremia, fluids given, mild worsening in creatinine, normal liver function and lipase, UA without signs of infection   Imaging Studies ordered:  I ordered imaging studies including CT abdomen pelvis with contrast I independently visualized and interpreted imaging which showed perforated sigmoid diverticulitis with extraluminal gas and stool, received call from radiologist regarding results I agree with the radiologist interpretation   Cardiac Monitoring:  The patient was maintained on a cardiac monitor.  I personally viewed and interpreted the cardiac monitored which showed an underlying rhythm of: Sinus rhythm   Medicines ordered and prescription drug management:  I ordered medication including IV  fentanyl, Zofran and fluid bolus for symptom control and Zosyn for antibiotic coverage for perforated diverticulitis I have reviewed the patients home medicines and have made adjustments as needed    ED Course:  Patient presents with 3 days of worsening lower abdominal pain.  His abdominal exam is concerning with some guarding.  Vitals are normal, he does not meet sepsis criteria but does have significant leukocytosis. CT with perforated sigmoid diverticulitis with extraluminal stool and gas.  Will discuss with surgery.  Patient started on IV Zosyn.  Pain currently well controlled Will require admission for IV antibiotics and potential surgery   Consultations Obtained:  I requested consultation with the general surgeon,  and discussed lab and imaging findings as well as pertinent plan -Dr. Redmond Pulling will see and admit patient  Reevaluation:  After the interventions noted above, I reevaluated the patient and found that they have :improved   Dispostion:  After consideration of the diagnostic results and the patients response to treatment feel that the patent would benefit from admission.          Final Clinical Impression(s) / ED Diagnoses Final diagnoses:  Perforation of sigmoid colon due to diverticulitis    Rx / DC Orders ED Discharge Orders     None         Janet Berlin 07/16/22 1956    Davonna Belling, MD 07/16/22 2356

## 2022-07-16 NOTE — Brief Op Note (Signed)
07/16/2022  11:54 PM  PATIENT:  Nicholas Huffman  76 y.o. male  PRE-OPERATIVE DIAGNOSIS:  Perorated Sigmoid diverticulitis   POST-OPERATIVE DIAGNOSIS:  same; feculent peritonitis  PROCEDURE:  Procedure(s): EXPLORATORY LAPAROTOMY, HARTMAN'S PROCEDURE (N/A) (Sigmoid colectomy with end colostomy)  SURGEON:  Surgeon(s) and Role:    Greer Pickerel, MD - Primary  PHYSICIAN ASSISTANT:   ASSISTANTS: none   ANESTHESIA:   general  EBL:  100 mL   BLOOD ADMINISTERED:none  DRAINS: (32f) Jackson-Pratt drain(s) with closed bulb suction in the pelvis and Urinary Catheter (Foley)   LOCAL MEDICATIONS USED:  NONE  SPECIMEN:  Source of Specimen:  sigmoid colon  DISPOSITION OF SPECIMEN:  PATHOLOGY  COUNTS:  YES  TOURNIQUET:  * No tourniquets in log *  DICTATION: .Dragon Dictation  PLAN OF CARE: Admit to inpatient   PATIENT DISPOSITION:  PACU - hemodynamically stable.   Delay start of Pharmacological VTE agent (>24hrs) due to surgical blood loss or risk of bleeding: no  ELeighton Ruff WRedmond Pulling MD, FACS General, Bariatric, & Minimally Invasive Surgery [facility] [place]

## 2022-07-16 NOTE — ED Triage Notes (Signed)
Patient complains of nausea, diarrhea, and diffuse lower abdominal pain that started three days ago. Patient denies vomiting.

## 2022-07-16 NOTE — ED Provider Notes (Signed)
[place]   [id number] 07/16/22 Arrival Time: 5956  ASSESSMENT & PLAN:  1. Lower abdominal pain    Quite tender over lower abdomen. Cannot r/o intra-abdominal process such as diverticulitis or appendicitis. Discussed. To ED for further evaluation; via private vehicle. Stable upon d/c.  I have personally viewed the imaging studies ordered this visit. No free air or signs of SBO on plain imaging.   Follow-up Information     Go to  [facility].   Specialty: Emergency Medicine Contact information: [address] [phone] [place] [phone]                Reviewed expectations re: course of current medical issues. Questions answered. Outlined signs and symptoms indicating need for more acute intervention. Patient verbalized understanding. After Visit Summary given.   SUBJECTIVE: History from: patient and wife . Nicholas Huffman is a 76 y.o. male who presents with complaint of fairly persistent lower abdominal pain. Onset gradual,  between 48-72 h ago . Discomfort described as dull; without radiation; does wake him at night; gradually worsening. Fever: denied. Aggravating factors: have not been identified. Alleviating factors: have not been identified. Did eat fried egg this am but no appetite; 'wife made me eat it'. With current nausea; emesis x 1-2 yesterday; none today. He denies belching, chills, diarrhea, hematuria, melena, and sweats. Ambulatory without assistance. Urinary symptoms: none. Bowel movements: does feel constipated; last bowel movement yesterday 'and loose' and without blood. Reports normal colonoscopy approx 3 w ago. No tx PTA.  Past Surgical History:  Procedure Laterality Date   BASAL CELL CARCINOMA EXCISION  2016   face   CATARACT EXTRACTION W/ INTRAOCULAR LENS  IMPLANT, BILATERAL     COLONOSCOPY  2008, 2002   hx polyps/Dr. Fuller Plan   EYE SURGERY     retina  detachment repair x 2   KNEE ARTHROSCOPY  April/2013   left   TOTAL KNEE ARTHROPLASTY Right 03/26/2018   Procedure: RIGHT TOTAL KNEE ARTHROPLASTY;  Surgeon: Leandrew Koyanagi, MD;  Location: [facility];  Service: Orthopedics;  Laterality: Right;   OBJECTIVE:  Vitals:   07/16/22 1031  BP: 111/61  Pulse: 89  Resp: (!) 22  Temp: 98 F (36.7 C)  TempSrc: Oral  SpO2: 94%    General appearance: alert, oriented, no acute distress; does appear uncomfortable HEENT: ; AT; oropharynx moist Lungs: unlabored respirations Abdomen: soft; without distention; significant TTP over lower abdomen (R slightly more than L); does pull away with palpation; bowel sounds present but quiet; without masses or organomegaly; without frank guarding or rebound tenderness Back: without reported CVA tenderness; FROM at waist Extremities: without LE edema; symmetrical; without gross deformities Skin: warm and dry Neurologic: normal gait Psychological: alert and cooperative; normal mood and affect  Imaging: DG Abd 2 Views  Result Date: 07/16/2022 CLINICAL DATA:  Abdominal pain. EXAM: ABDOMEN - 2 VIEW COMPARISON:  None Available. FINDINGS: The bowel gas pattern is normal. There is no evidence of free air. No radio-opaque calculi or other significant radiographic abnormality is seen. IMPRESSION: Negative. Electronically Signed   By: Marijo Conception M.D.   On: 07/16/2022 10:50     No Known Allergies                                             Past Medical History:  Diagnosis Date   Cancer ([place])    basal cell   Cataract    Fractured skull ([place])    as a child   Hyperlipidemia    Hypertension    Primary localized osteoarthritis of right knee    hx of- knee has been replaced   Smoker    Wears glasses    Wears hearing aid in both ears     Social History   Socioeconomic History   Marital status: Married    Spouse name: Not on file   Number of children: Not on file   Years of education: Not on file   Highest  education level: Not on file  Occupational History   Not on file  Tobacco Use   Smoking status: Every Day    Packs/day: 1.00    Types: Cigarettes   Smokeless tobacco: Never   Tobacco comments:    pt to call PCP for patch  Vaping Use   Vaping Use: Never used  Substance and Sexual Activity   Alcohol use: No   Drug use: No   Sexual activity: Not on file  Other Topics Concern   Not on file  Social History Narrative   Not on file   Social Determinants of Health   Financial Resource Strain: Not on file  Food Insecurity: Not on file  Transportation Needs: Not on file  Physical Activity: Not on file  Stress: Not on file  Social Connections: Not on file  Intimate Partner Violence: Not on file    Family History  Problem Relation Age of Onset   Breast cancer Mother    Colon cancer Neg Hx    Esophageal cancer Neg Hx    Rectal cancer Neg Hx    Stomach cancer Neg Hx      Vanessa Kick, MD 07/16/22 1122

## 2022-07-16 NOTE — ED Triage Notes (Signed)
Abdominal pain for 3 days, vomited x 2, no diarrhea, reports constipation.  Last bm yesterday early morning, 5am-reported as loose.  Reports taking a laxative on Saturday.  Current pain is in lower abdomen.  Denies issues urinating  Called pcp-no availability today

## 2022-07-16 NOTE — ED Provider Triage Note (Signed)
Emergency Medicine Provider Triage Evaluation Note  Nicholas Huffman , a 76 y.o. male  was evaluated in triage.  Pt complains of lower abdominal pain that started about 2 to 3 days ago.  He states pain is across the entirety of the lower abdomen and he is had episodes of nausea, vomiting and diarrhea.  Denies fevers, chills, recent travel, new restaurants or suspicious food ingestions.  He went to urgent care who advised him to come to the emergency department to rule out intra-abdominal process such as diverticulitis or appendicitis.  Denies chest pain, shortness of breath, dysuria, hematuria  Review of Systems  Positive:  Negative:   Physical Exam  BP 116/63   Pulse 88   Temp 98.4 F (36.9 C)   Resp 15   SpO2 96%  Gen:   Awake, no distress   Resp:  Normal effort  MSK:   Moves extremities without difficulty  Other:  Abdomen is soft, nondistended, tender to palpation across the lower abdomen  Medical Decision Making  Medically screening exam initiated at 12:49 PM.  Appropriate orders placed.  Adele Barthel was informed that the remainder of the evaluation will be completed by another provider, this initial triage assessment does not replace that evaluation, and the importance of remaining in the ED until their evaluation is complete.     Tonye Pearson, [place] 07/16/22 1251

## 2022-07-16 NOTE — H&P (Signed)
Reason for Consult:perforated colon Referring Physician: Dr Tenny Craw is an 76 y.o. male.  HPI: 76 year old man with a history of hypertension hyperlipidemia and tobacco use states that he developed lower abdominal pain about 3 days ago.  It was pretty intense.  So on Sunday he tried to go to an urgent care but could not find one that was open he states.  He called his PCP when the pain did not get better and they advised him to go to urgent care.  He went to urgent care earlier today and after assessment he was immediately sent to the emergency room.  He states the pain eased up a little bit on Monday but returned.  He states he has had some mild nausea and vomiting.  No fever or chills.  Last bowel movement was Sunday.  He typically has a bowel movement maybe every other day.  No melena hematochezia.  He just had a colonoscopy by Dr. 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  He smokes 1 pack/day for many years.  He denies any chest pain, chest pressure, shortness of breath, dyspnea on exertion, claudication, TIAs or amaurosis fugax.  No prior abdominal surgery.  No alcohol use or drug use.  Lives with his wife.  Walks his dogs daily.  Past Medical History:  Diagnosis Date   Cancer ([place])    basal cell   Cataract    Fractured skull ([place])    as a child   Hyperlipidemia    Hypertension    Primary localized osteoarthritis of right knee    hx of- knee has been replaced   Smoker    Wears glasses    Wears hearing aid in both ears     Past Surgical History:  Procedure Laterality Date   BASAL CELL CARCINOMA EXCISION  2016   face   CATARACT EXTRACTION W/ INTRAOCULAR LENS  IMPLANT, BILATERAL     COLONOSCOPY  2008, 2002   hx polyps/Dr. 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 He has been smoking an average of 1 pack per day. He has never used smokeless tobacco. He reports that he does not drink alcohol and does not use drugs.  Allergies: No Known Allergies  Medications: I have reviewed the patient's current medications.  Results for orders placed or performed during the hospital encounter of 07/16/22 (from the past 48 hour(s))  Urinalysis, Routine w reflex microscopic     Status: Abnormal   Collection Time: 07/16/22 12:27 PM  Result Value Ref Range   Color, Urine YELLOW YELLOW   APPearance CLEAR CLEAR   Specific Gravity, Urine 1.017 1.005 - 1.030   pH 5.0 5.0 - 8.0   Glucose, UA NEGATIVE NEGATIVE mg/dL   Hgb urine dipstick SMALL (A) NEGATIVE   Bilirubin Urine  NEGATIVE NEGATIVE   Ketones, ur NEGATIVE NEGATIVE mg/dL   Protein, ur 30 (A) NEGATIVE mg/dL   Nitrite NEGATIVE NEGATIVE   Leukocytes,Ua NEGATIVE NEGATIVE   RBC / HPF 0-5 0 - 5 RBC/hpf   WBC, UA 0-5 0 - 5 WBC/hpf   Bacteria, UA RARE (A) NONE SEEN   Mucus PRESENT    Hyaline Casts, UA PRESENT     Comment: Performed at [facility] [address], [place], [place] [address]  Lipase, blood     Status: None   Collection Time: 07/16/22 12:34 PM  Result Value Ref Range   Lipase 34 11 - 51 U/L    Comment: Performed at [facility] [address] [place], [place] [address]  Comprehensive metabolic panel     Status: Abnormal   Collection Time: 07/16/22 12:34 PM  Result Value Ref Range   Sodium 132 (L) 135 - 145 mmol/L   Potassium 3.2 (L) 3.5 - 5.1 mmol/L   Chloride 99 98 - 111 mmol/L   CO2 23 22 - 32 mmol/L   Glucose, Bld 131 (H) 70  - 99 mg/dL    Comment: Glucose reference range applies only to samples taken after fasting for at least 8 hours.   BUN 29 (H) 8 - 23 mg/dL   Creatinine, Ser 1.46 (H) 0.61 - 1.24 mg/dL   Calcium 8.9 8.9 - 10.3 mg/dL   Total Protein 6.5 6.5 - 8.1 g/dL   Albumin 3.5 3.5 - 5.0 g/dL   AST 19 15 - 41 U/L   ALT 18 0 - 44 U/L   Alkaline Phosphatase 67 38 - 126 U/L   Total Bilirubin 1.0 0.3 - 1.2 mg/dL   GFR, Estimated 50 (L) >60 mL/min    Comment: (NOTE) Calculated using the CKD-EPI Creatinine Equation (2021)    Anion gap 10 5 - 15    Comment: Performed at [facility] [address] [place], [place] [address]  CBC     Status: Abnormal   Collection Time: 07/16/22 12:34 PM  Result Value Ref Range   WBC 22.3 (H) 4.0 - 10.5 K/uL   RBC 4.91 4.22 - 5.81 MIL/uL   Hemoglobin 15.2 13.0 - 17.0 g/dL   HCT 43.9 39.0 - 52.0 %   MCV 89.4 80.0 - 100.0 fL   MCH 31.0 26.0 - 34.0 pg   MCHC 34.6 30.0 - 36.0 g/dL   RDW 14.6 11.5 - 15.5 %   Platelets 212 150 - 400 K/uL   nRBC 0.0 0.0 - 0.2 %    Comment: Performed at [facility], [place] [address] [place], [place] [address]    CT ABDOMEN PELVIS W CONTRAST  Result Date: 07/16/2022 CLINICAL DATA:  Abdominal pain for 3 days, vomiting EXAM: CT ABDOMEN AND PELVIS WITH CONTRAST TECHNIQUE: Multidetector CT imaging of the abdomen and pelvis was performed using the standard protocol following bolus administration of intravenous contrast. RADIATION DOSE REDUCTION: This exam was performed according to the departmental dose-optimization program which includes automated exposure control, adjustment of the mA and/or kV according to patient size and/or use of iterative reconstruction technique. CONTRAST:  145m OMNIPAQUE IOHEXOL 300 MG/ML  SOLN COMPARISON:  07/16/2022 FINDINGS: Lower chest: No acute pleural or parenchymal lung disease. Hepatobiliary: No focal liver abnormality is seen. No gallstones, gallbladder wall thickening, or biliary dilatation. Pancreas:  Unremarkable. No pancreatic ductal dilatation or surrounding inflammatory changes. Spleen: Normal in size without focal abnormality. Adrenals/Urinary Tract: Adrenal glands are  unremarkable. Kidneys are normal, without renal calculi, focal lesion, or hydronephrosis. Bladder is unremarkable. Stomach/Bowel: There is diverticulosis of the sigmoid colon, with long segment wall thickening consistent with acute diverticulitis. There is extraluminal gas and stool surrounding the inflamed sigmoid colon within the central pelvis, consistent with perforation. No fluid collection or abscess at this time. No bowel obstruction or ileus. Vascular/Lymphatic: Aortic atherosclerosis. No enlarged abdominal or pelvic lymph nodes. Reproductive: Prostate is unremarkable. Other: There is a small amount of free fluid within the left lower quadrant. Extraluminal gas and stool are seen surrounding the inflamed sigmoid colon. Punctate foci of free intraperitoneal gas are seen within the lower central abdomen, as well as a punctate focus of gas within the right inguinal canal. Musculoskeletal: No acute or destructive bony lesions. Reconstructed images demonstrate no additional findings. IMPRESSION: 1. Acute perforated sigmoid diverticulitis, with significant amount of extraluminal gas and stool within the pelvis. No organized fluid collection or drainable abscess at this time. Surgical consultation is recommended. 2.  Aortic Atherosclerosis (ICD10-I70.0). Critical Value/emergent results were called by telephone at the time of interpretation on 07/16/2022 at 6:46 pm to provider [facility], who verbally acknowledged these results. Electronically Signed   By: Randa Ngo M.D.   On: 07/16/2022 18:49   DG Abd 2 Views  Result Date: 07/16/2022 CLINICAL DATA:  Abdominal pain. EXAM: ABDOMEN - 2 VIEW COMPARISON:  None Available. FINDINGS: The bowel gas pattern is normal. There is no evidence of free air. No radio-opaque calculi or other significant  radiographic abnormality is seen. IMPRESSION: Negative. Electronically Signed   By: Marijo Conception M.D.   On: 07/16/2022 10:50    Review of Systems  All other systems reviewed and are negative.  Blood pressure 116/65, pulse 87, temperature 98.6 F (37 C), temperature source Oral, resp. rate 18, SpO2 94 %. Physical Exam Vitals reviewed.  Constitutional:      General: He is not in acute distress.    Appearance: He is well-developed. He is ill-appearing. He is not diaphoretic.  HENT:     Head: Normocephalic and atraumatic.     Right Ear: External ear normal.     Left Ear: External ear normal.  Eyes:     General: No scleral icterus.    Conjunctiva/sclera: Conjunctivae normal.  Neck:     Thyroid: No thyromegaly.     Trachea: No tracheal deviation.  Cardiovascular:     Rate and Rhythm: Normal rate.     Pulses: Normal pulses.     Heart sounds: Normal heart sounds.  Pulmonary:     Effort: Pulmonary effort is normal. Tachypnea (mild) present. No respiratory distress.     Breath sounds: Normal breath sounds. No stridor. No wheezing.  Abdominal:     Palpations: Abdomen is soft.     Tenderness: There is abdominal tenderness in the periumbilical area and suprapubic area. There is guarding and rebound.     Comments: Peritonitis in lower abdomen  Musculoskeletal:        General: No tenderness.     Cervical back: Normal range of motion and neck supple.  Lymphadenopathy:     Cervical: No cervical adenopathy.  Skin:    General: Skin is warm and dry.     Coloration: Skin is not pale.     Findings: No erythema or rash.  Neurological:     Mental Status: He is alert and oriented to person, place, and time.     Motor: No abnormal muscle tone.  Psychiatric:  Behavior: Behavior normal.        Thought Content: Thought content normal.        Judgment: Judgment normal.    .  Data reviewed - colonoscopy 7/23; ED notes, vitals, labs, personally reviewed CT; pcp note  5/23  Assessment/Plan: 76 yo with perforated sigmoid diverticulitis with peritonitis AKI H/o HTN Tobacco use  Pt has extraluminal gas and stool, no abscess; since he has peritonitis I recommended emergent surgical intervention - colectomy with end colostomy (hartman procedure).  Discussed that I thought he would be very high risk for worsening condition without surgery. Wife at [facility]  I discussed the procedure in detail.   We discussed the risks and benefits of surgery including, but not limited to bleeding, infection (such as wound infection, abdominal abscess), injury to surrounding structures, blood clot formation, urinary retention, incisional hernia, colostomy issues, anesthesia risks, pulmonary & cardiac complications such as pneumonia &/or heart attack, need for additional procedures, ileus, & prolonged hospitalization & death. D  We discussed the typical postoperative recovery course, including limitations & restrictions postoperatively. Discussed possibility of dc to snf/rehab. Discussed potential for multisystem organ failure.  explained that the likelihood of improvement in their symptoms is good but pt at mod-high risk for complications. Discussed ACS NSQIP risk calculator and given a copy. Advised that his chronic smoking, emergency surgery, perf colon, age increase  his risk for complications.   Iv zosyn  High level MDM -he has an acute illness that poses a threat to life or bodily function, decision regarding emergency major surgery  ACS RISK CALCULATOR USE:  Risk Calculator was used for discussion of surgery: Yes           Greer Pickerel 07/16/2022, 8:22 PM

## 2022-07-16 NOTE — Anesthesia Preprocedure Evaluation (Addendum)
Anesthesia Evaluation  Patient identified by MRN, date of birth, ID band Patient awake    Reviewed: Allergy & Precautions, H&P , NPO status , Patient's Chart, lab work & pertinent test results  Airway Mallampati: II  TM Distance: >3 FB Neck ROM: Full    Dental no notable dental hx. (+) Teeth Intact, Dental Advisory Given   Pulmonary Current SmokerPatient did not abstain from smoking.,    Pulmonary exam normal breath sounds clear to auscultation       Cardiovascular hypertension, Pt. on medications negative cardio ROS   Rhythm:Regular Rate:Normal     Neuro/Psych negative neurological ROS  negative psych ROS   GI/Hepatic negative GI ROS, Neg liver ROS,   Endo/Other  negative endocrine ROS  Renal/GU Renal InsufficiencyRenal disease  negative genitourinary   Musculoskeletal  (+) Arthritis , Osteoarthritis,    Abdominal   Peds  Hematology negative hematology ROS (+)   Anesthesia Other Findings   Reproductive/Obstetrics negative OB ROS                            Anesthesia Physical Anesthesia Plan  ASA: 2 and emergent  Anesthesia Plan: General   Post-op Pain Management: Ofirmev IV (intra-op)*   Induction: Intravenous, Rapid sequence and Cricoid pressure planned  PONV Risk Score and Plan: 2 and Ondansetron and Dexamethasone  Airway Management Planned: Oral ETT  Additional Equipment:   Intra-op Plan:   Post-operative Plan: Extubation in OR  Informed Consent: I have reviewed the patients History and Physical, chart, labs and discussed the procedure including the risks, benefits and alternatives for the proposed anesthesia with the patient or authorized representative who has indicated his/her understanding and acceptance.     Dental advisory given  Plan Discussed with: CRNA  Anesthesia Plan Comments:         Anesthesia Quick Evaluation

## 2022-07-17 ENCOUNTER — Other Ambulatory Visit: Payer: Self-pay

## 2022-07-17 ENCOUNTER — Encounter (HOSPITAL_COMMUNITY): Payer: Self-pay

## 2022-07-17 LAB — BASIC METABOLIC PANEL
Anion gap: 12 (ref 5–15)
BUN: 26 mg/dL — ABNORMAL HIGH (ref 8–23)
CO2: 23 mmol/L (ref 22–32)
Calcium: 8.4 mg/dL — ABNORMAL LOW (ref 8.9–10.3)
Chloride: 99 mmol/L (ref 98–111)
Creatinine, Ser: 1.16 mg/dL (ref 0.61–1.24)
GFR, Estimated: >60 mL/min (ref >60)
Glucose, Bld: 200 mg/dL — ABNORMAL HIGH (ref 70–99)
Potassium: 3.7 mmol/L (ref 3.5–5.1)
Sodium: 134 mmol/L — ABNORMAL LOW (ref 135–145)

## 2022-07-17 LAB — MRSA NEXT GEN BY PCR, NASAL: MRSA by PCR Next Gen: NOT DETECTED

## 2022-07-17 LAB — CBC
HCT: 41.5 % (ref 39.0–52.0)
Hemoglobin: 14.6 g/dL (ref 13.0–17.0)
MCH: 31.1 pg (ref 26.0–34.0)
MCHC: 35.2 g/dL (ref 30.0–36.0)
MCV: 88.5 fL (ref 80.0–100.0)
Platelets: 187 10*3/uL (ref 150–400)
RBC: 4.69 MIL/uL (ref 4.22–5.81)
RDW: 14.6 % (ref 11.5–15.5)
WBC: 14.5 10*3/uL — ABNORMAL HIGH (ref 4.0–10.5)
nRBC: 0 % (ref 0.0–0.2)

## 2022-07-17 LAB — MAGNESIUM: Magnesium: 1.8 mg/dL (ref 1.7–2.4)

## 2022-07-17 LAB — PHOSPHORUS: Phosphorus: 2.4 mg/dL — ABNORMAL LOW (ref 2.5–4.6)

## 2022-07-17 MED ORDER — PIPERACILLIN-TAZOBACTAM 3.375 G IVPB
3.3750 g | Freq: Three times a day (TID) | INTRAVENOUS | Status: DC
  Administered 2022-07-17 – 2022-07-23 (×19): 3.375 g via INTRAVENOUS
  Filled 2022-07-17 (×21): qty 50

## 2022-07-17 MED ORDER — PANTOPRAZOLE SODIUM 40 MG IV SOLR
40.0000 mg | Freq: Every day | INTRAVENOUS | Status: DC
  Administered 2022-07-17 – 2022-07-22 (×6): 40 mg via INTRAVENOUS
  Filled 2022-07-17 (×6): qty 10

## 2022-07-17 MED ORDER — CHLORHEXIDINE GLUCONATE CLOTH 2 % EX PADS
6.0000 | MEDICATED_PAD | Freq: Every day | CUTANEOUS | Status: DC
Start: 2022-07-17 — End: 2022-07-22
  Administered 2022-07-17 – 2022-07-21 (×5): 6 via TOPICAL

## 2022-07-17 MED ORDER — MORPHINE SULFATE (PF) 2 MG/ML IV SOLN
1.0000 mg | INTRAVENOUS | Status: DC | PRN
  Administered 2022-07-20: 1 mg via INTRAVENOUS
  Filled 2022-07-17: qty 1

## 2022-07-17 MED ORDER — DIPHENHYDRAMINE HCL 12.5 MG/5ML PO ELIX: 12.5000 mg | ORAL_SOLUTION | Freq: Four times a day (QID) | ORAL | Status: DC | PRN

## 2022-07-17 MED ORDER — METHOCARBAMOL 500 MG PO TABS: 500.0000 mg | ORAL_TABLET | Freq: Three times a day (TID) | ORAL | Status: DC | PRN

## 2022-07-17 MED ORDER — DIPHENHYDRAMINE HCL 50 MG/ML IJ SOLN: 12.5000 mg | Freq: Four times a day (QID) | INTRAMUSCULAR | Status: DC | PRN

## 2022-07-17 MED ORDER — ENOXAPARIN SODIUM 40 MG/0.4ML IJ SOSY
40.0000 mg | PREFILLED_SYRINGE | Freq: Every day | INTRAMUSCULAR | Status: DC
  Administered 2022-07-17 – 2022-07-19 (×3): 40 mg via SUBCUTANEOUS
  Filled 2022-07-17 (×3): qty 0.4

## 2022-07-17 MED ORDER — ONDANSETRON HCL 4 MG/2ML IJ SOLN
4.0000 mg | Freq: Four times a day (QID) | INTRAMUSCULAR | Status: DC | PRN
  Administered 2022-07-19: 4 mg via INTRAVENOUS
  Filled 2022-07-17: qty 2

## 2022-07-17 MED ORDER — METHOCARBAMOL 1000 MG/10ML IJ SOLN: 500.0000 mg | Freq: Three times a day (TID) | INTRAVENOUS | Status: DC | PRN

## 2022-07-17 MED ORDER — AMLODIPINE BESYLATE 10 MG PO TABS
10.0000 mg | ORAL_TABLET | Freq: Every day | ORAL | Status: DC
Start: 2022-07-17 — End: 2022-07-19
  Administered 2022-07-17 – 2022-07-19 (×3): 10 mg via ORAL
  Filled 2022-07-17 (×3): qty 1

## 2022-07-17 MED ORDER — ACETAMINOPHEN 500 MG PO TABS
1000.0000 mg | ORAL_TABLET | Freq: Four times a day (QID) | ORAL | Status: DC
  Administered 2022-07-17 – 2022-07-23 (×21): 1000 mg via ORAL
  Filled 2022-07-17 (×24): qty 2

## 2022-07-17 MED ORDER — ONDANSETRON 4 MG PO TBDP: 4.0000 mg | ORAL_TABLET | Freq: Four times a day (QID) | ORAL | Status: DC | PRN

## 2022-07-17 MED ORDER — HYDROMORPHONE HCL 1 MG/ML IJ SOLN
INTRAMUSCULAR | Status: AC
  Filled 2022-07-17: qty 1

## 2022-07-17 MED ORDER — NICOTINE 21 MG/24HR TD PT24
21.0000 mg | MEDICATED_PATCH | Freq: Every day | TRANSDERMAL | Status: DC
  Administered 2022-07-17 – 2022-07-23 (×7): 21 mg via TRANSDERMAL
  Filled 2022-07-17 (×7): qty 1

## 2022-07-17 MED ORDER — KCL IN DEXTROSE-NACL 20-5-0.45 MEQ/L-%-% IV SOLN
INTRAVENOUS | Status: DC
  Filled 2022-07-17 (×11): qty 1000

## 2022-07-17 NOTE — Anesthesia Postprocedure Evaluation (Signed)
Anesthesia Post Note  Patient: Nicholas Huffman  Procedure(s) Performed: EXPLORATORY LAPAROTOMY, HARTMAN'S PROCEDURE     Patient location during evaluation: PACU Anesthesia Type: General Level of consciousness: awake and alert Pain management: pain level controlled Vital Signs Assessment: post-procedure vital signs reviewed and stable Respiratory status: spontaneous breathing, nonlabored ventilation, respiratory function stable and patient connected to nasal cannula oxygen Cardiovascular status: blood pressure returned to baseline and stable Postop Assessment: no apparent nausea or vomiting Anesthetic complications: no   No notable events documented.  Last Vitals:  Vitals:   07/17/22 0143 07/17/22 0200  BP: 121/72 124/63  Pulse: 74 78  Resp: 20 19  Temp: 37.1 C   SpO2: 91% 90%    Last Pain:  Vitals:   07/17/22 0143  TempSrc: Oral  PainSc:                  Derika Eckles,W. [place]MOND

## 2022-07-17 NOTE — Progress Notes (Signed)
Mobility Specialist Progress Note    07/17/22 1604  Mobility  Activity Ambulated with assistance in hallway  Level of Assistance Minimal assist, patient does 75% or more  Assistive Device Front wheel walker  Distance Ambulated (ft) 100 ft  Activity Response Tolerated well  $Mobility charge 1 Mobility   Pre-Mobility: 86 HR, 132/71 BP, 91% SpO2 Post-Mobility: 97 HR, 148/61 BP, 88% SpO2  Pt received in bed and agreeable. On 4LO2, encouraged pursed lip breathing. Returned to chair with call bell in reach.    Hildred Alamin Mobility Specialist

## 2022-07-17 NOTE — Transfer of Care (Signed)
Immediate Anesthesia Transfer of Care Note  Patient: Nicholas Huffman  Procedure(s) Performed: EXPLORATORY LAPAROTOMY, HARTMAN'S PROCEDURE  Patient Location: PACU  Anesthesia Type:General  Level of Consciousness: drowsy and pateint uncooperative  Airway & Oxygen Therapy: Patient Spontanous Breathing and Patient connected to face mask oxygen  Post-op Assessment: Report given to RN and Post -op Vital signs reviewed and stable  Post vital signs: Reviewed and stable  Last Vitals:  Vitals Value Taken Time  BP    Temp    Pulse 98 07/17/22 0016  Resp 24 07/17/22 0016  SpO2 93 % 07/17/22 0016  Vitals shown include unvalidated device data.  Last Pain:  Vitals:   07/16/22 1857  TempSrc:   PainSc: 5          Complications: No notable events documented.

## 2022-07-17 NOTE — Op Note (Signed)
PATIENT:  Nicholas Huffman  76 y.o. male   PRE-OPERATIVE DIAGNOSIS:  Perorated Sigmoid diverticulitis    POST-OPERATIVE DIAGNOSIS:  same; feculent peritonitis   PROCEDURE:  Procedure(s): EXPLORATORY LAPAROTOMY, HARTMAN'S PROCEDURE (N/A) (Sigmoid colectomy with end colostomy)   SURGEON:  Surgeon(s) and Role:    Greer Pickerel, MD - Primary   PHYSICIAN ASSISTANT: NONE   ASSISTANTS: none    ANESTHESIA:   general   EBL:  100 mL    BLOOD ADMINISTERED:none   DRAINS: (51f) Jackson-Pratt drain(s) with closed bulb suction in the pelvis and Urinary Catheter (Foley)    LOCAL MEDICATIONS USED:  NONE   SPECIMEN:  Source of Specimen:  sigmoid colon   DISPOSITION OF SPECIMEN:  PATHOLOGY   COUNTS:  YES   TOURNIQUET:  * No tourniquets in log *  INDICATIONS FOR PROCEDURE: Patient had developed abdominal pain several days prior to his presentation.  He states that he had been having abdominal pain mainly in the lower abdomen for 3 days.  He tried to go to an urgent care on Sunday but could not find one that was open.  He then called his PCP and after discussing his symptoms he was directed to urgent care and upon evaluation urgent care he was sent directly to the emergency room.  A CT scan demonstrated significant pneumoperitoneum, perforated sigmoid diverticulitis with extraluminal gas and stool.  The patient had peritonitis on exam.  I recommended urgent laparotomy with sigmoid colectomy and end colostomy.  I had an extensive conversation with the patient and his wife and discussed the risk and benefits which are separately documented.  DESCRIPTION OF PROCEDURE: [facility]  General endotracheal anesthesia was established.  Sequential compression devices were placed.  A Foley catheter was placed.  His abdomen was prepped and draped in the usual standard surgical fashion with  ChloraPrep.  He received IV Zosyn in the emergency room.  A surgical timeout was performed.  A midline incision was made starting at the pubic bone extending an inch or 2 above the umbilicus.  Subcutaneous tissue was divided with electrocautery.  The fascia was incised and the abdominal cavity was entered.  A wound protector was placed.  There was purulent fluid within the abdominal cavity.  The peritoneum was injected consistent with peritonitis.  Small bowel was not dilated at this point.  I ran the small bowel.  There is no evidence of small bowel perforation.  The appendix was normal.  The cecum and ascending and transverse colon were normal.  The descending colon appeared normal.  The small bowel was packed into the upper abdomen after placement of a Balfour retractor.  There was purulence in the pelvis along with stool.  The sigmoid colon started to become densely inflamed at the pelvic inlet.  It was stuck to the left pelvic inlet.  I decided to go ahead and transect the sigmoid colon just above this area of where it was starting to get indurated.  A small rent was made in the mesentery with Bovie electrocautery.  I then divided the sigmoid colon with an Echelon stapler with a green load.  I then started to take down some of the lateral attachments of the sigmoid colon where it was adhered to the left pelvic sidewall using a combination of right angle and Bovie electrocautery.  This area was friable and oozy.  I took  down sigmoid colon mesentery in sequential fashion using the LigaSure device.  It was very thickened and friable and inflamed.  I came across the area of sigmoid colon perforation.  He had a large perforation probably of about 1 and half inches.  There was significant contamination on the left inner pelvic sidewall from where it had tried to wall itself off.  There was additional stool in the pelvis.  I was able to identify a section of upper rectum that felt grossly normal.  Made a small defect  in the mesentery with electrocautery.  I was unable to get a contour stapler with a green load across and fired across the upper rectum.  I then took down the remaining sigmoid colon mesentery with LigaSure device trying to stay as close as possible to the bowel wall.  There was some oozing from the sigmoid colon mesentery.  Several 2-0 silk figure-of-eight sutures were placed for hemostasis.  I then started mobilizing the proximal descending colon by taking down the lateral attachments with a right angle and LigaSure and Bovie electrocautery.  I felt that I had achieved enough mobilization and in order to bring up an end colostomy.  I tacked the rectal stump with two 2-0 Prolene's -1 on the left side of the staple line and the other on the right side of the staple line.  There are Prolene on the left side of the staple line was tacked to the pelvic inlet loosely to try to prevent retraction of the rectal stump.  The rectal stump staple line appeared intact.  I then irrigated the abdomen with several liters of saline.  The wound protector was removed.  I made a circular skin incision with Bovie electrocautery in the left mid abdominal wall and the mid rectus sheath slight the above the level of the umbilicus.  Subcutaneous tissue was divided with electrocautery.  The fascia was incised in a cruciate fashion with electrocautery.  The rectus muscle was spread with Kellys and the peritoneum was incised.  I was then able to bring out the descending colon up through the abdominal wall.  It did not appear to be under tension.  I ensured it was not twisted as it came up through the abdominal wall.  There is adequate length.  Omentum was then placed back over the small bowel.  The fascia was then closed with a #1 looped PDS 1 from above and 1 from below and tied centrally.  The skin was left open and packed with moist 4 x 4's and dry gauze.  I then turned my attention to maturing the colostomy.  I excised about 1 and half  centimeters of the colon just proximal to the staple line that is where there appeared to be a transition in viability.  The remaining ostomy appeared quite viable.  I then matured the colostomy in typical fashion using multiple 3-0 Vicryl's suturing it to the dermis.  Ostomy appliance was applied.  All needle, instrument, and sponge counts were correct x2.  There were no immediate complications.  It should be noted that during the procedure the patient was noted to have a rash initially just on his abdominal wall where the ChloraPrep had been placed.  Then anesthesia noticed that it had spread to his arms.  Patient was given Decadron and Benadryl.  After patient arrived in PACU anesthesia noted that the rash had resolved.  CASE DATA:  Type of patient?: DOW CASE (Surgical Hospitalist [facility])  Status of Case?  EMERGENT Add On  Infection Present At Time Of Surgery (PATOS)?  FECULENT PERITONITIS & purulence in pelvis and lower abdomen    .   DICTATION: IT trainer Dictation   PLAN OF CARE: Admit to inpatient    PATIENT DISPOSITION:  PACU - hemodynamically stable.   Delay start of Pharmacological VTE agent (>24hrs) due to surgical blood loss or risk of bleeding: no   Leighton Ruff. Redmond Pulling, MD, FACS General, Bariatric, & Minimally Invasive Surgery [facility], [place]

## 2022-07-17 NOTE — Plan of Care (Signed)
  Problem: Education: Goal: Knowledge of General Education information will improve Description: Including pain rating scale, medication(s)/side effects and non-pharmacologic comfort measures Outcome: Progressing   Problem: Health Behavior/Discharge Planning: Goal: Ability to manage health-related needs will improve Outcome: Progressing   Problem: Clinical Measurements: Goal: Ability to maintain clinical measurements within normal limits will improve Outcome: Progressing   Problem: Activity: Goal: Risk for activity intolerance will decrease Outcome: Progressing   Problem: Nutrition: Goal: Adequate nutrition will be maintained Outcome: Progressing   Problem: Pain Managment: Goal: General experience of comfort will improve Outcome: Progressing   Problem: Skin Integrity: Goal: Risk for impaired skin integrity will decrease Outcome: Progressing   

## 2022-07-17 NOTE — Consult Note (Addendum)
[place] Nurse ostomy consult note Patient receiving care in [facility] 2C13 Stoma type/location: Sigmoid colectomy with end colostomy Stomal assessment/size: 2 5/8 inch pink moist and budded Peristomal assessment: Pouch not changed Treatment options for stomal/peristomal skin: Barrier ring Output; thin bloody Ostomy pouching: 2pc. 2 3/4 inch pouch Kellie Simmering # [phone]) Skin barrier Kellie Simmering #2) Barrier rings Kellie Simmering # [phone]) Education provided: None patient still groggy, states wife will want to participate in education Enrolled patient in [place] program: No  WOC nurse will take educational materials and enroll in [place] with next visit.  Supplies ordered to be placed in patient room.   Thank you for the consult. [place] nurse will follow for teaching and education.  Please re-consult the [place] team if needed.  Cathlean Marseilles Tamala Julian, MSN, RN, [place], Lysle Pearl, [facility] Wound Treatment Associate Pager [phone]

## 2022-07-18 LAB — CBC
HCT: 46 % (ref 39.0–52.0)
Hemoglobin: 15.9 g/dL (ref 13.0–17.0)
MCH: 30.8 pg (ref 26.0–34.0)
MCHC: 34.6 g/dL (ref 30.0–36.0)
MCV: 89.1 fL (ref 80.0–100.0)
Platelets: 242 10*3/uL (ref 150–400)
RBC: 5.16 MIL/uL (ref 4.22–5.81)
RDW: 14.6 % (ref 11.5–15.5)
WBC: 16.7 10*3/uL — ABNORMAL HIGH (ref 4.0–10.5)
nRBC: 0 % (ref 0.0–0.2)

## 2022-07-18 LAB — BASIC METABOLIC PANEL
Anion gap: 10 (ref 5–15)
BUN: 21 mg/dL (ref 8–23)
CO2: 22 mmol/L (ref 22–32)
Calcium: 8.7 mg/dL — ABNORMAL LOW (ref 8.9–10.3)
Chloride: 101 mmol/L (ref 98–111)
Creatinine, Ser: 1.2 mg/dL (ref 0.61–1.24)
GFR, Estimated: >60 mL/min (ref >60)
Glucose, Bld: 157 mg/dL — ABNORMAL HIGH (ref 70–99)
Potassium: 4.2 mmol/L (ref 3.5–5.1)
Sodium: 133 mmol/L — ABNORMAL LOW (ref 135–145)

## 2022-07-18 LAB — MAGNESIUM: Magnesium: 1.9 mg/dL (ref 1.7–2.4)

## 2022-07-18 MED ORDER — METOPROLOL TARTRATE 5 MG/5ML IV SOLN
INTRAVENOUS | Status: AC
  Filled 2022-07-18: qty 5

## 2022-07-18 MED ORDER — METOPROLOL TARTRATE 5 MG/5ML IV SOLN
5.0000 mg | Freq: Once | INTRAVENOUS | Status: AC
  Administered 2022-07-18: 5 mg via INTRAVENOUS

## 2022-07-18 MED ORDER — DILTIAZEM HCL 25 MG/5ML IV SOLN
10.0000 mg | Freq: Once | INTRAVENOUS | Status: AC
  Administered 2022-07-18: 10 mg via INTRAVENOUS
  Filled 2022-07-18: qty 5

## 2022-07-18 MED ORDER — DILTIAZEM HCL-DEXTROSE 125-5 MG/125ML-% IV SOLN (PREMIX)
5.0000 mg/h | INTRAVENOUS | Status: DC
  Administered 2022-07-18: 10 mg/h via INTRAVENOUS
  Administered 2022-07-18: 5 mg/h via INTRAVENOUS
  Administered 2022-07-19 – 2022-07-20 (×4): 15 mg/h via INTRAVENOUS
  Filled 2022-07-18 (×7): qty 125

## 2022-07-18 MED ORDER — DILTIAZEM LOAD VIA INFUSION
15.0000 mg | Freq: Once | INTRAVENOUS | Status: AC
  Administered 2022-07-18: 15 mg via INTRAVENOUS
  Filled 2022-07-18: qty 15

## 2022-07-18 NOTE — Progress Notes (Signed)
Mobility Specialist Progress Note    07/18/22 1728  Mobility  Activity Ambulated with assistance in hallway  Level of Assistance Contact guard assist, steadying assist  Assistive Device Front wheel walker  Distance Ambulated (ft) 420 ft  Activity Response Tolerated well  $Mobility charge 1 Mobility   Pre-Mobility: 104 HR, 94% SpO2 During Mobility: 130s-140s HR Post-Mobility: 137 HR, 90% SpO2  Pt received in chair and agreeable. No complaints on walk. On 3LO2, encouraged pursed lip breathing. Returned to bed with call bell in reach.    Hildred Alamin Mobility Specialist

## 2022-07-18 NOTE — Progress Notes (Addendum)
[facility] Progress Note  2 Days Post-Op  Subjective: CC:  Denies abd pain at rest. Having mild nausea without vomiting. States he did not get much sleep bc he was coughing intermittently. Denies feeling heart palpitations, denies chest pain. Denies a history of heart arrhythmia. States he lives in [place] with his wife who will be able to help him with ostomy care.    Objective: Vital signs in last 24 hours: Temp:  [97.7 F (36.5 C)-98.1 F (36.7 C)] 97.9 F (36.6 C) (08/10 0810) Pulse Rate:  [84-163] 122 (08/10 0810) Resp:  [19-33] 29 (08/10 0810) BP: (105-152)/(51-126) 141/93 (08/10 0810) SpO2:  [89 %-93 %] 91 % (08/10 0810) Last BM Date : 07/17/22  Intake/Output from previous day: 08/09 0701 - 08/10 0700 In: 3285.1 [I.V.:3165.5; IV Piggyback:119.6] Out: 5427 [Urine:1000; Drains:145] Intake/Output this shift: No intake/output data recorded.  PE: Gen:  Alert, NAD, pleasant and cooperative Card:  irregularly irregular, no lower extremity edema  Pulm:  Normal effort on 3L [place], clear to auscultation bilaterally Abd: Soft, mild to moderate distention, minline wound c/d/I- dressing changed by me. Ostomy pink and viable - no gas/stool in pouch.  GU: foley in place draining clear yellow urine Skin: warm and dry, no rashes  Psych: A&Ox3   Lab Results:  Recent Labs    07/17/22 0547 07/18/22 0053  WBC 14.5* 16.7*  HGB 14.6 15.9  HCT 41.5 46.0  PLT 187 242   BMET Recent Labs    07/17/22 0547 07/18/22 0053  NA 134* 133*  K 3.7 4.2  CL 99 101  CO2 23 22  GLUCOSE 200* 157*  BUN 26* 21  CREATININE 1.16 1.20  CALCIUM 8.4* 8.7*   PT/INR No results for input(s): "LABPROT", "INR" in the last 72 hours. CMP     Component Value Date/Time   NA 133 (L) 07/18/2022 0053   K 4.2 07/18/2022 0053   CL 101 07/18/2022 0053   CO2 22 07/18/2022 0053   GLUCOSE 157 (H) 07/18/2022 0053   BUN 21 07/18/2022 0053   CREATININE 1.20 07/18/2022 0053   CREATININE  1.27 04/12/2022 0808   CALCIUM 8.7 (L) 07/18/2022 0053   PROT 6.5 07/16/2022 1234   ALBUMIN 3.5 07/16/2022 1234   AST 19 07/16/2022 1234   ALT 18 07/16/2022 1234   ALKPHOS 67 07/16/2022 1234   BILITOT 1.0 07/16/2022 1234   GFRNONAA >60 07/18/2022 0053   GFRNONAA 59 (L) 03/13/2021 0836   GFRAA 69 03/13/2021 0836   Lipase     Component Value Date/Time   LIPASE 34 07/16/2022 1234       Studies/Results: CT ABDOMEN PELVIS W CONTRAST  Result Date: 07/16/2022 CLINICAL DATA:  Abdominal pain for 3 days, vomiting EXAM: CT ABDOMEN AND PELVIS WITH CONTRAST TECHNIQUE: Multidetector CT imaging of the abdomen and pelvis was performed using the standard protocol following bolus administration of intravenous contrast. RADIATION DOSE REDUCTION: This exam was performed according to the departmental dose-optimization program which includes automated exposure control, adjustment of the mA and/or kV according to patient size and/or use of iterative reconstruction technique. CONTRAST:  156m OMNIPAQUE IOHEXOL 300 MG/ML  SOLN COMPARISON:  07/16/2022 FINDINGS: Lower chest: No acute pleural or parenchymal lung disease. Hepatobiliary: No focal liver abnormality is seen. No gallstones, gallbladder wall thickening, or biliary dilatation. Pancreas: Unremarkable. No pancreatic ductal dilatation or surrounding inflammatory changes. Spleen: Normal in size without focal abnormality. Adrenals/Urinary Tract: Adrenal glands are unremarkable. Kidneys are normal, without renal calculi, focal lesion, or  hydronephrosis. Bladder is unremarkable. Stomach/Bowel: There is diverticulosis of the sigmoid colon, with long segment wall thickening consistent with acute diverticulitis. There is extraluminal gas and stool surrounding the inflamed sigmoid colon within the central pelvis, consistent with perforation. No fluid collection or abscess at this time. No bowel obstruction or ileus. Vascular/Lymphatic: Aortic atherosclerosis. No enlarged  abdominal or pelvic lymph nodes. Reproductive: Prostate is unremarkable. Other: There is a small amount of free fluid within the left lower quadrant. Extraluminal gas and stool are seen surrounding the inflamed sigmoid colon. Punctate foci of free intraperitoneal gas are seen within the lower central abdomen, as well as a punctate focus of gas within the right inguinal canal. Musculoskeletal: No acute or destructive bony lesions. Reconstructed images demonstrate no additional findings. IMPRESSION: 1. Acute perforated sigmoid diverticulitis, with significant amount of extraluminal gas and stool within the pelvis. No organized fluid collection or drainable abscess at this time. Surgical consultation is recommended. 2.  Aortic Atherosclerosis (ICD10-I70.0). Critical Value/emergent results were called by telephone at the time of interpretation on 07/16/2022 at 6:46 pm to provider [facility], who verbally acknowledged these results. Electronically Signed   By: Randa Ngo M.D.   On: 07/16/2022 18:49   DG Abd 2 Views  Result Date: 07/16/2022 CLINICAL DATA:  Abdominal pain. EXAM: ABDOMEN - 2 VIEW COMPARISON:  None Available. FINDINGS: The bowel gas pattern is normal. There is no evidence of free air. No radio-opaque calculi or other significant radiographic abnormality is seen. IMPRESSION: Negative. Electronically Signed   By: Marijo Conception M.D.   On: 07/16/2022 10:50    Anti-infectives: Anti-infectives (From admission, onward)    Start     Dose/Rate Route Frequency Ordered Stop   07/17/22 0245  piperacillin-tazobactam (ZOSYN) IVPB 3.375 g        3.375 g 12.5 mL/hr over 240 Minutes Intravenous Every 8 hours 07/17/22 0158 07/24/22 0559   07/16/22 1900  piperacillin-tazobactam (ZOSYN) IVPB 3.375 g        3.375 g 100 mL/hr over 30 Minutes Intravenous  Once 07/16/22 1851 07/16/22 1931        Assessment/Plan Perforated sigmoid diverticulitis with feculent peritonitis S/p  EXPLORATORY LAPAROTOMY,  HARTMAN'S PROCEDURE 07/17/22 Dr. Redmond Pulling - POD #1 - afebrile, HR 122 (see below)  - WBC 16.7 from 14.5 - continue JP drain  - await bowel function  Afib with RVR -- started overnight, was just started on dilt gtt, monitor HTN - PRN IV meds for now HLD  Tobacco use  FEN: NPO, IVF at 100cc/hr, aROBF -- ok for sips with meds, can have a popsicle daily for comfort  ID: Zosyn 8/9 >>  Foley: continue today for strict I&Os, plan to D/C tomorrow AM 8/11 VTE: SCD's, Lovenox Dispo: Progressive care, encourage IS every hour and wean O2 as able. Nonproductive cough and afebrile    LOS: 2 days   I reviewed nursing notes, last 24 h vitals and pain scores, last 48 h intake and output, last 24 h labs and trends, and last 24 h imaging results.    Obie Dredge, PA-C [facility] Please see Amion for pager number during day hours 7:00am-4:30pm

## 2022-07-18 NOTE — Consult Note (Signed)
[place] Nurse ostomy follow up Patient receiving care in [facility] 2C13 Stoma type/location: LLQ colostomy Stomal assessment/size: 1 5/8 inch, pink, moist, budded.  Peristomal assessment: intact Treatment options for stomal/peristomal skin: barrier ring Output: thin bloody Ostomy pouching: 2pc. 2 3/4 inch Kellie Simmering # [phone]) skin barrier Kellie Simmering # 2) barrier rings Kellie Simmering # [phone]) Education provided: Wife present for education and teaching. Wife completed the entire pouch change with my assistance.  Explained role of ostomy nurse and creation of stoma  Explained stoma characteristics (budded, flush, color, texture, care) Wife completed pouch change (cutting new skin barrier, cleaning peristomal skin and stoma, use of barrier ring) Education on emptying when 1/3 to 1/2 full and how to empty Demonstrated "burping" flatus from pouch Demonstrated use of wick to clean spout  Discussed diet, gas, constipation Handout given "Eating with an Ostomy" ([employer] [place].) www.ostomy.org (2022)    Discussed risk of peristomal hernia- wearing a binder Discussed treatment of peristomal skin (ostomy powder, skin barrier wipes) Answered patient/family questions:  Educational material given, discussed ostomy clinic. Enrolled patient in [place] Start Discharge program: Yes  Welcome to [place]! Thank you for choosing to enroll. A member of our team will be in touch shortly to explain our services, which include:  Personalized support from a [place], supplier, and insurance information Condition-specific information and connection to community resources If you have questions now, or in the future, please call us at [phone].  I recommend/discussed the [facility] outpatient ostomy clinic as an outpatient resource.  IF MD agrees this would be beneficial, please fax referral, or enter electronically in Epic the referral. (Fax- [phone])    [place] nurse will see again tomorrow for another teaching session prior to patient being discharged.   Cathlean Marseilles Tamala Julian, MSN, RN, [place], Lysle Pearl, [facility] Wound Treatment Associate Pager [phone]

## 2022-07-18 NOTE — Progress Notes (Signed)
Mobility Specialist Progress Note    07/18/22 1154  Mobility  Activity Ambulated with assistance in hallway  Level of Assistance Minimal assist, patient does 75% or more  Assistive Device Front wheel walker  Distance Ambulated (ft) 320 ft  Activity Response Tolerated well  $Mobility charge 1 Mobility   Pre-Mobility: 133 HR, 121/74 BP, 92% SpO2 During Mobility: 148-153 HR Post-Mobility: 140 HR, 112/76 BP, 91% SpO2  Pt received sitting EOB and agreeable. No complaints on walk. On 3LO2, encouraged pursed lip breathing. Returned to chair with call bell in reach.    Hildred Alamin Mobility Specialist

## 2022-07-19 ENCOUNTER — Inpatient Hospital Stay (HOSPITAL_COMMUNITY): Payer: Medicare Other

## 2022-07-19 ENCOUNTER — Encounter (HOSPITAL_COMMUNITY): Payer: Self-pay

## 2022-07-19 DIAGNOSIS — I4891 Unspecified atrial fibrillation: Secondary | ICD-10-CM | POA: Diagnosis not present

## 2022-07-19 DIAGNOSIS — Z720 Tobacco use: Secondary | ICD-10-CM

## 2022-07-19 DIAGNOSIS — I480 Paroxysmal atrial fibrillation: Secondary | ICD-10-CM

## 2022-07-19 DIAGNOSIS — I10 Essential (primary) hypertension: Secondary | ICD-10-CM

## 2022-07-19 LAB — BASIC METABOLIC PANEL
Anion gap: 8 (ref 5–15)
BUN: 25 mg/dL — ABNORMAL HIGH (ref 8–23)
CO2: 21 mmol/L — ABNORMAL LOW (ref 22–32)
Calcium: 8.5 mg/dL — ABNORMAL LOW (ref 8.9–10.3)
Chloride: 102 mmol/L (ref 98–111)
Creatinine, Ser: 1.07 mg/dL (ref 0.61–1.24)
GFR, Estimated: >60 mL/min (ref >60)
Glucose, Bld: 150 mg/dL — ABNORMAL HIGH (ref 70–99)
Potassium: 3.8 mmol/L (ref 3.5–5.1)
Sodium: 131 mmol/L — ABNORMAL LOW (ref 135–145)

## 2022-07-19 LAB — ECHOCARDIOGRAM COMPLETE
Height: 67 in
S' Lateral: 3.4 cm
Weight: 2553.81 oz

## 2022-07-19 LAB — CBC
HCT: 42.8 % (ref 39.0–52.0)
Hemoglobin: 14.9 g/dL (ref 13.0–17.0)
MCH: 30.7 pg (ref 26.0–34.0)
MCHC: 34.8 g/dL (ref 30.0–36.0)
MCV: 88.1 fL (ref 80.0–100.0)
Platelets: 290 10*3/uL (ref 150–400)
RBC: 4.86 MIL/uL (ref 4.22–5.81)
RDW: 14.6 % (ref 11.5–15.5)
WBC: 14.6 10*3/uL — ABNORMAL HIGH (ref 4.0–10.5)
nRBC: 0 % (ref 0.0–0.2)

## 2022-07-19 LAB — MAGNESIUM: Magnesium: 1.9 mg/dL (ref 1.7–2.4)

## 2022-07-19 LAB — SURGICAL PATHOLOGY

## 2022-07-19 MED ORDER — PERFLUTREN LIPID MICROSPHERE
1.0000 mL | INTRAVENOUS | Status: AC | PRN
  Administered 2022-07-19: 2 mL via INTRAVENOUS

## 2022-07-19 NOTE — Progress Notes (Addendum)
  Echocardiogram 2D Echocardiogram with contrast has been performed.  Nicholas Huffman F 07/19/2022, 5:04 PM

## 2022-07-19 NOTE — Plan of Care (Signed)
  Problem: Education: Goal: Knowledge of General Education information will improve Description: Including pain rating scale, medication(s)/side effects and non-pharmacologic comfort measures Outcome: Progressing   Problem: Health Behavior/Discharge Planning: Goal: Ability to manage health-related needs will improve Outcome: Progressing   Problem: Clinical Measurements: Goal: Ability to maintain clinical measurements within normal limits will improve Outcome: Progressing   Problem: Activity: Goal: Risk for activity intolerance will decrease Outcome: Progressing   Problem: Nutrition: Goal: Adequate nutrition will be maintained Outcome: Progressing   Problem: Elimination: Goal: Will not experience complications related to bowel motility Outcome: Progressing   Problem: Pain Managment: Goal: General experience of comfort will improve Outcome: Progressing   Problem: Skin Integrity: Goal: Risk for impaired skin integrity will decrease Outcome: Progressing

## 2022-07-19 NOTE — Progress Notes (Signed)
Mobility Specialist Progress Note    07/19/22 1024  Mobility  Activity Ambulated with assistance in hallway  Level of Assistance Contact guard assist, steadying assist  Assistive Device Other (Comment) (HHA)  Distance Ambulated (ft) 420 ft  Activity Response Tolerated well  $Mobility charge 1 Mobility   Pre-Mobility: 104 HR, 153/69 BP, 92% SpO2 During Mobility: 140s-150s HR Post-Mobility: 121 HR, 92% SpO2  Pt received in bed and agreeable. No complaints. SOB w/ exertion but maintained SpO2 88-92% on 2LO2. Encouraged pursed lip breathing. Returned to chair with call bell in reach. RN advised to leave on 2LO2.    Hildred Alamin Mobility Specialist

## 2022-07-19 NOTE — TOC Progression Note (Addendum)
Transition of Care [facility]) - Progression Note    Patient Details  Name: Nicholas Huffman MRN: [id number] Date of Birth: 29-Dec-1945  Transition of Care [facility]) CM/SW [place], RN Phone Number:[phone]  07/19/2022, 12:45 PM  Clinical Narrative:     Transition of Care [facility]) Screening Note   Patient Details  Name: [name] Date of Birth: 1946/02/20   Transition of Care [facility]) CM/SW Contact:    Angelita Ingles, RN Phone Number: 07/19/2022, 12:46 PM    Transition of Care Department [facility]) has reviewed patient and no TOC needs have been identified at this time. We will continue to monitor patient advancement through interdisciplinary progression rounds.   TOC acknowledges patient with new colostomy. Per [place] notes patient has been enrolled patient in [employer] Discharge program.           Expected Discharge Plan and Services                                                 Social Determinants of Health (SDOH) Interventions    Readmission Risk Interventions     No data to display

## 2022-07-19 NOTE — Progress Notes (Addendum)
[facility] Progress Note  3 Days Post-Op  Subjective: CC:  No new complaints. Denies nausea/vomiting. Reports some "gurgling" in his abd. No ostomy output. States he walked yesterday.   Objective: Vital signs in last 24 hours: Temp:  [97.6 F (36.4 C)-98.2 F (36.8 C)] 98.2 F (36.8 C) (08/11 0814) Pulse Rate:  [95-153] 113 (08/11 0814) Resp:  [19-37] 28 (08/11 0814) BP: (107-159)/(65-93) 144/81 (08/11 0814) SpO2:  [86 %-93 %] 92 % (08/11 0814) Last BM Date :  (colostomy)  Intake/Output from previous day: 08/10 0701 - 08/11 0700 In: 1295 [P.O.:60; I.V.:1235] Out: 1530 [Urine:1350; Drains:160; Stool:20] Intake/Output this shift: No intake/output data recorded.  PE: Gen:  Alert, NAD, pleasant and cooperative Card:  irregularly irregular, no lower extremity edema  Pulm:  Normal effort on 2L [place], clear to auscultation bilaterally Abd: Soft, mild to moderate distention, minline wound c/d/I. Ostomy pink and viable - no gas/stool in pouch.  GU: foley in place draining clear yellow urine Skin: warm and dry, no rashes  Psych: A&Ox3   Lab Results:  Recent Labs    07/18/22 0053 07/19/22 0034  WBC 16.7* 14.6*  HGB 15.9 14.9  HCT 46.0 42.8  PLT 242 290   BMET Recent Labs    07/18/22 0053 07/19/22 0034  NA 133* 131*  K 4.2 3.8  CL 101 102  CO2 22 21*  GLUCOSE 157* 150*  BUN 21 25*  CREATININE 1.20 1.07  CALCIUM 8.7* 8.5*   PT/INR No results for input(s): "LABPROT", "INR" in the last 72 hours. CMP     Component Value Date/Time   NA 131 (L) 07/19/2022 0034   K 3.8 07/19/2022 0034   CL 102 07/19/2022 0034   CO2 21 (L) 07/19/2022 0034   GLUCOSE 150 (H) 07/19/2022 0034   BUN 25 (H) 07/19/2022 0034   CREATININE 1.07 07/19/2022 0034   CREATININE 1.27 04/12/2022 0808   CALCIUM 8.5 (L) 07/19/2022 0034   PROT 6.5 07/16/2022 1234   ALBUMIN 3.5 07/16/2022 1234   AST 19 07/16/2022 1234   ALT 18 07/16/2022 1234   ALKPHOS 67 07/16/2022 1234   BILITOT  1.0 07/16/2022 1234   GFRNONAA >60 07/19/2022 0034   GFRNONAA 59 (L) 03/13/2021 0836   GFRAA 69 03/13/2021 0836   Lipase     Component Value Date/Time   LIPASE 34 07/16/2022 1234       Studies/Results: No results found.  Anti-infectives: Anti-infectives (From admission, onward)    Start     Dose/Rate Route Frequency Ordered Stop   07/17/22 0245  piperacillin-tazobactam (ZOSYN) IVPB 3.375 g        3.375 g 12.5 mL/hr over 240 Minutes Intravenous Every 8 hours 07/17/22 0158 07/24/22 0559   07/16/22 1900  piperacillin-tazobactam (ZOSYN) IVPB 3.375 g        3.375 g 100 mL/hr over 30 Minutes Intravenous  Once 07/16/22 1851 07/16/22 1931        Assessment/Plan Perforated sigmoid diverticulitis with feculent peritonitis S/p  EXPLORATORY LAPAROTOMY, HARTMAN'S PROCEDURE 07/17/22 Dr. Redmond Pulling - POD #2  - afebrile, HR 113 bpm irregular - on dilt gtt - WBC 16.7 > 14.5  - continue JP drain  - await bowel function, WOC RN For new ostomy  Afib with RVR -- started POD#1, still w/ RVR despite titration of dilt gtt yesterday, cards consult today HTN - PRN IV meds for now HLD  Tobacco use  FEN: CLD, IVF at 50 cc/hr, aROBF ID: Zosyn 8/9 >>  Foley: D/C  today VTE: SCD's, Lovenox Dispo: Progressive care, encourage IS every hour and wean O2 as able. Cards consult    LOS: 3 days   I reviewed nursing notes, last 24 h vitals and pain scores, last 48 h intake and output, last 24 h labs and trends, and last 24 h imaging results.    Obie Dredge, PA-C [facility] Please see Amion for pager number during day hours 7:00am-4:30pm

## 2022-07-19 NOTE — Consult Note (Addendum)
[place] Nurse ostomy follow up Pouch not changed today as it was changed with teaching session yesterday. Spouse wanted to review over the steps of changing the pouch.  Explained stoma characteristics (budded, flush, color, texture, care) Demonstrated pouch change (cutting new skin barrier, measuring stoma, cleaning peristomal skin and stoma, use of barrier ring) Education on emptying when 1/3 to 1/2 full and how to empty Demonstrated "burping" flatus from pouch Demonstrated use of wick to clean spout  Discussed diet, gas Discussed risk of peristomal hernia (no lifting, pulling, pushing) Take binder home and use as needed.  Discussed treatment of peristomal skin (ostomy powder, skin barrier wipes) Answered patient/family questions:  SS enrollment complete. Reminded spouse about the ostomy clinic and to look for the [facility] kit that should be delivered by [employer].  I recommend/discussed the [facility] outpatient ostomy clinic as an outpatient resource.  IF MD agrees this would be beneficial, please fax referral, or enter electronically in Epic the referral. (Fax- [phone])   WOC will see patient again on Monday if not discharged over the weekend.   Cathlean Marseilles Tamala Julian, MSN, RN, [place], Lysle Pearl, [facility] Wound Treatment Associate Pager [phone]

## 2022-07-19 NOTE — Consult Note (Addendum)
Cardiology Consultation:   Patient ID: Nicholas Huffman MRN: [id number]; DOB: September 19, 1946  Admit date: 07/16/2022 Date of Consult: 07/19/2022  PCP:  Susy Frizzle, MD   [facility] Providers Cardiologist:  New; Dr Stanford Breed    Patient Profile:   Nicholas Huffman is a 76 y.o. male with a hx of hypertension, hyperlipidemia admitted with perforated sigmoid diverticulitis/peritonitis requiring surgical intervention who is being seen 07/19/2022 for the evaluation of atrial fibrillation at the request of Greer Pickerel, MD.  History of Present Illness:   Patient admitted August 8 with abdominal pain and found to have perforated sigmoid diverticulitis/peritonitis requiring surgical intervention.  Developed atrial fibrillation August 10 early a.m.  Cardiology now asked to evaluate.  Note he typically does not have dyspnea on exertion, orthopnea, PND, pedal edema, exertional chest pain or syncope.  He continues to deny any symptoms of dyspnea, chest pain or palpitations.   Past Medical History:  Diagnosis Date   Cancer ([place])    basal cell   Cataract    Fractured skull ([place])    as a child   Hyperlipidemia    Hypertension    Primary localized osteoarthritis of right knee    hx of- knee has been replaced   Smoker    Wears glasses    Wears hearing aid in both ears     Past Surgical History:  Procedure Laterality Date   BASAL CELL CARCINOMA EXCISION  2016   face   CATARACT EXTRACTION W/ INTRAOCULAR LENS  IMPLANT, BILATERAL     COLONOSCOPY  2008, 2002   hx polyps/Dr. Fuller Plan   EYE SURGERY     retina detachment repair x 2   KNEE ARTHROSCOPY  April/2013   left   LAPAROTOMY N/A 07/16/2022   Procedure: EXPLORATORY LAPAROTOMY, HARTMAN'S PROCEDURE;  Surgeon: Greer Pickerel, MD;  Location: [facility];  Service: General;  Laterality: N/A;   TOTAL KNEE ARTHROPLASTY Right 03/26/2018   Procedure: RIGHT TOTAL KNEE ARTHROPLASTY;  Surgeon: Leandrew Koyanagi, MD;  Location: [facility];  Service: Orthopedics;  Laterality:  Right;     Inpatient Medications: Scheduled Meds:  acetaminophen  1,000 mg Oral Q6H   Chlorhexidine Gluconate Cloth  6 each Topical Q0600   enoxaparin (LOVENOX) injection  40 mg Subcutaneous Daily   nicotine  21 mg Transdermal Daily   pantoprazole (PROTONIX) IV  40 mg Intravenous QHS   Continuous Infusions:  dextrose 5 % and 0.45 % NaCl with KCl 20 mEq/L 50 mL/hr at 07/19/22 1110   diltiazem (CARDIZEM) infusion 15 mg/hr (07/19/22 0430)   methocarbamol (ROBAXIN) IV     piperacillin-tazobactam (ZOSYN)  IV 3.375 g (07/19/22 0659)   PRN Meds: diphenhydrAMINE **OR** diphenhydrAMINE, methocarbamol **OR** methocarbamol (ROBAXIN) IV, morphine injection, ondansetron **OR** ondansetron (ZOFRAN) IV  Allergies:   No Known Allergies  Social History:   Social History   Socioeconomic History   Marital status: Married    Spouse name: Not on file   Number of children: Not on file   Years of education: Not on file   Highest education level: Not on file  Occupational History   Not on file  Tobacco Use   Smoking status: Every Day    Packs/day: 1.00    Types: Cigarettes   Smokeless tobacco: Never   Tobacco comments:    pt to call PCP for patch  Vaping Use   Vaping Use: Never used  Substance and Sexual Activity   Alcohol use: No   Drug use: No   Sexual  activity: Not on file  Other Topics Concern   Not on file  Social History Narrative   Not on file   Social Determinants of Health   Financial Resource Strain: Not on file  Food Insecurity: Not on file  Transportation Needs: Not on file  Physical Activity: Not on file  Stress: Not on file  Social Connections: Not on file  Intimate Partner Violence: Not on file    Family History:    Family History  Problem Relation Age of Onset   Cancer Mother    Breast cancer Mother    Cancer Father    Colon cancer Neg Hx    Esophageal cancer Neg Hx    Rectal cancer Neg Hx    Stomach cancer Neg Hx      ROS:  Please see the history of  present illness.  Abdominal pain improving. All other ROS reviewed and negative.     Physical Exam/Data:   Vitals:   07/19/22 0252 07/19/22 0256 07/19/22 0300 07/19/22 0814  BP:   (!) 159/87 (!) 144/81  Pulse: (!) 137 (!) 135 (!) 145 (!) 113  Resp: (!) 36 (!) 37 (!) 36 (!) 28  Temp:   98.1 F (36.7 C) 98.2 F (36.8 C)  TempSrc:   Oral Oral  SpO2: (!) 86% 91% 91% 92%  Weight:      Height:        Intake/Output Summary (Last 24 hours) at 07/19/2022 1112 Last data filed at 07/19/2022 0800 Gross per 24 hour  Intake 950 ml  Output 1110 ml  Net -160 ml      07/17/2022    1:43 AM 06/19/2022    8:44 AM 05/22/2022    9:01 AM  Last 3 Weights  Weight (lbs) 159 lb 9.8 oz 158 lb 158 lb 9.6 oz  Weight (kg) 72.4 kg 71.668 kg 71.94 kg     Body mass index is 25 kg/m.  General:  Well nourished, well developed, in no acute distress HEENT: normal Neck: no JVD Vascular: No carotid bruits; Distal pulses 2+ bilaterally Cardiac:  normal S1, S2; irregular and mildly tachycardic, no murmur noted. Lungs: Diminished breath sounds throughout with mild expiratory wheeze Abd: Status post abdominal surgery Ext: no edema Musculoskeletal:  No deformities, BUE and BLE strength normal and equal Skin: warm and dry  Neuro:  CNs 2-12 intact, no focal abnormalities noted Psych:  Normal affect   EKG:  The EKG was personally reviewed and demonstrates: Atrial fibrillation with rapid ventricular response, PVC or aberrantly conducted beat, nonspecific ST changes. Telemetry:  Telemetry was personally reviewed and demonstrates: Atrial fibrillation with rate upper normal to mildly elevated.   Laboratory Data:  Chemistry Recent Labs  Lab 07/17/22 0547 07/18/22 0053 07/19/22 0034  NA 134* 133* 131*  K 3.7 4.2 3.8  CL 99 101 102  CO2 23 22 21*  GLUCOSE 200* 157* 150*  BUN 26* 21 25*  CREATININE 1.16 1.20 1.07  CALCIUM 8.4* 8.7* 8.5*  MG 1.8 1.9 1.9  GFRNONAA >60 >60 >60  ANIONGAP '12 10 8    '$ Recent  Labs  Lab 07/16/22 1234  PROT 6.5  ALBUMIN 3.5  AST 19  ALT 18  ALKPHOS 67  BILITOT 1.0    Hematology Recent Labs  Lab 07/17/22 0547 07/18/22 0053 07/19/22 0034  WBC 14.5* 16.7* 14.6*  RBC 4.69 5.16 4.86  HGB 14.6 15.9 14.9  HCT 41.5 46.0 42.8  MCV 88.5 89.1 88.1  MCH 31.1 30.8 30.7  MCHC 35.2 34.6 34.8  RDW 14.6 14.6 14.6  PLT 187 242 290   Radiology/Studies:  CT ABDOMEN PELVIS W CONTRAST  Result Date: 07/16/2022 CLINICAL DATA:  Abdominal pain for 3 days, vomiting EXAM: CT ABDOMEN AND PELVIS WITH CONTRAST TECHNIQUE: Multidetector CT imaging of the abdomen and pelvis was performed using the standard protocol following bolus administration of intravenous contrast. RADIATION DOSE REDUCTION: This exam was performed according to the departmental dose-optimization program which includes automated exposure control, adjustment of the mA and/or kV according to patient size and/or use of iterative reconstruction technique. CONTRAST:  152m OMNIPAQUE IOHEXOL 300 MG/ML  SOLN COMPARISON:  07/16/2022 FINDINGS: Lower chest: No acute pleural or parenchymal lung disease. Hepatobiliary: No focal liver abnormality is seen. No gallstones, gallbladder wall thickening, or biliary dilatation. Pancreas: Unremarkable. No pancreatic ductal dilatation or surrounding inflammatory changes. Spleen: Normal in size without focal abnormality. Adrenals/Urinary Tract: Adrenal glands are unremarkable. Kidneys are normal, without renal calculi, focal lesion, or hydronephrosis. Bladder is unremarkable. Stomach/Bowel: There is diverticulosis of the sigmoid colon, with long segment wall thickening consistent with acute diverticulitis. There is extraluminal gas and stool surrounding the inflamed sigmoid colon within the central pelvis, consistent with perforation. No fluid collection or abscess at this time. No bowel obstruction or ileus. Vascular/Lymphatic: Aortic atherosclerosis. No enlarged abdominal or pelvic lymph nodes.  Reproductive: Prostate is unremarkable. Other: There is a small amount of free fluid within the left lower quadrant. Extraluminal gas and stool are seen surrounding the inflamed sigmoid colon. Punctate foci of free intraperitoneal gas are seen within the lower central abdomen, as well as a punctate focus of gas within the right inguinal canal. Musculoskeletal: No acute or destructive bony lesions. Reconstructed images demonstrate no additional findings. IMPRESSION: 1. Acute perforated sigmoid diverticulitis, with significant amount of extraluminal gas and stool within the pelvis. No organized fluid collection or drainable abscess at this time. Surgical consultation is recommended. 2.  Aortic Atherosclerosis (ICD10-I70.0). Critical Value/emergent results were called by telephone at the time of interpretation on 07/16/2022 at 6:46 pm to [facility] verbally acknowledged these results. Electronically Signed   By: MRanda NgoM.D.   On: 07/16/2022 18:49   DG Abd 2 Views  Result Date: 07/16/2022 CLINICAL DATA:  Abdominal pain. EXAM: ABDOMEN - 2 VIEW COMPARISON:  None Available. FINDINGS: The bowel gas pattern is normal. There is no evidence of free air. No radio-opaque calculi or other significant radiographic abnormality is seen. IMPRESSION: Negative. Electronically Signed   By: JMarijo ConceptionM.D.   On: 07/16/2022 10:50     Assessment and Plan:   Postoperative atrial fibrillation-patient has developed postoperative atrial fibrillation with rapid ventricular response.  He is completely asymptomatic.  Will continue IV Cardizem and transition to oral later as tolerated.  Schedule echocardiogram to assess LV function.  CHA2DS2-VASc is 3.  Would add apixaban 5 mg twice daily when okay from a surgical standpoint.  We will then see back in the office and if atrial fibrillation persists proceed with cardioversion at that time. Hypertension-given addition of Cardizem for atrial fibrillation we will  discontinue amlodipine.  Follow blood pressure and adjust regimen as needed.  Can resume losartan HCT if needed. Hyperlipidemia-resume statin at discharge. Tobacco abuse-patient counseled on discontinuing. Status post repair of perforated sigmoid diverticulitis/peritonitis-Per general surgery.   Risk Assessment/Risk Scores:    CHA2DS2-VASc Score = 3  :1} This indicates a 3.2% annual risk of stroke. The patient's score is based upon: CHF History: 0 HTN  History: 1 Diabetes History: 0 Stroke History: 0 Vascular Disease History: 0 Age Score: 2 Gender Score: 0   For questions or updates, please contact [place] Please consult [email or web address] for contact info under    Signed, Kirk Ruths, MD  07/19/2022 11:12 AM

## 2022-07-20 DIAGNOSIS — Z9889 Other specified postprocedural states: Secondary | ICD-10-CM

## 2022-07-20 LAB — BASIC METABOLIC PANEL
Anion gap: 9 (ref 5–15)
BUN: 32 mg/dL — ABNORMAL HIGH (ref 8–23)
CO2: 21 mmol/L — ABNORMAL LOW (ref 22–32)
Calcium: 8.7 mg/dL — ABNORMAL LOW (ref 8.9–10.3)
Chloride: 101 mmol/L (ref 98–111)
Creatinine, Ser: 1.14 mg/dL (ref 0.61–1.24)
GFR, Estimated: >60 mL/min (ref >60)
Glucose, Bld: 138 mg/dL — ABNORMAL HIGH (ref 70–99)
Potassium: 3.9 mmol/L (ref 3.5–5.1)
Sodium: 131 mmol/L — ABNORMAL LOW (ref 135–145)

## 2022-07-20 LAB — CBC
HCT: 42.3 % (ref 39.0–52.0)
Hemoglobin: 14.4 g/dL (ref 13.0–17.0)
MCH: 30.5 pg (ref 26.0–34.0)
MCHC: 34 g/dL (ref 30.0–36.0)
MCV: 89.6 fL (ref 80.0–100.0)
Platelets: 326 10*3/uL (ref 150–400)
RBC: 4.72 MIL/uL (ref 4.22–5.81)
RDW: 14.7 % (ref 11.5–15.5)
WBC: 14.4 10*3/uL — ABNORMAL HIGH (ref 4.0–10.5)
nRBC: 0 % (ref 0.0–0.2)

## 2022-07-20 LAB — MAGNESIUM: Magnesium: 1.9 mg/dL (ref 1.7–2.4)

## 2022-07-20 MED ORDER — APIXABAN 5 MG PO TABS
5.0000 mg | ORAL_TABLET | Freq: Two times a day (BID) | ORAL | Status: DC
  Administered 2022-07-20 – 2022-07-23 (×7): 5 mg via ORAL
  Filled 2022-07-20 (×7): qty 1

## 2022-07-20 MED ORDER — DILTIAZEM HCL 60 MG PO TABS
60.0000 mg | ORAL_TABLET | Freq: Four times a day (QID) | ORAL | Status: DC
Start: 2022-07-20 — End: 2022-07-23
  Administered 2022-07-20 – 2022-07-23 (×11): 60 mg via ORAL
  Filled 2022-07-20 (×13): qty 1

## 2022-07-20 NOTE — Progress Notes (Signed)
Mobility Specialist Progress Note    07/20/22 1051  Mobility  Activity Ambulated with assistance in hallway  Level of Assistance Contact guard assist, steadying assist  Assistive Device Other (Comment) (HHA)  Distance Ambulated (ft) 480 ft  Activity Response Tolerated fair  $Mobility charge 1 Mobility   Pre-Mobility: 97 HR, 120/70 BP During Mobility: 90s-110s HR, 88-92% on 3LO2 SpO2 Post-Mobility: 105 HR, 88% SpO2  Pt received in chair and agreeable. No complaints on walk. On 3LO2, encouraged pursed lip breathing. Returned to chair with RN, NT, and family present.   Hildred Alamin Mobility Specialist

## 2022-07-20 NOTE — Progress Notes (Signed)
4 Days Post-Op   Subjective/Chief Complaint: PT doing well this AM Afib but rate controlled Appreciate Cards input   Objective: Vital signs in last 24 hours: Temp:  [97.8 F (36.6 C)-98.3 F (36.8 C)] 98.2 F (36.8 C) (08/12 0722) Pulse Rate:  [91-111] 111 (08/12 0722) Resp:  [20-34] 30 (08/12 0722) BP: (123-147)/(61-72) 123/65 (08/12 0722) SpO2:  [84 %-98 %] 90 % (08/12 0722) Last BM Date :  (new colostomy)  Intake/Output from previous day: 08/11 0701 - 08/12 0700 In: 300 [P.O.:200; IV Piggyback:100] Out: 1200 [Urine:1025; Drains:175] Intake/Output this shift: No intake/output data recorded.  PE:  Constitutional: No acute distress, conversant, appears states age. Eyes: Anicteric sclerae, moist conjunctiva, no lid lag Lungs: Clear to auscultation bilaterally, normal respiratory effort CV: regular rate and rhythm, no murmurs, no peripheral edema, pedal pulses 2+ GI: Soft, no masses or hepatosplenomegaly, non-tender to palpation, midline wound c/d/I, ostomy pink/patent no output Skin: No rashes, palpation reveals normal turgor Psychiatric: appropriate judgment and insight, oriented to person, place, and time   Lab Results:  Recent Labs    07/19/22 0034 07/20/22 0015  WBC 14.6* 14.4*  HGB 14.9 14.4  HCT 42.8 42.3  PLT 290 326   BMET Recent Labs    07/19/22 0034 07/20/22 0015  NA 131* 131*  K 3.8 3.9  CL 102 101  CO2 21* 21*  GLUCOSE 150* 138*  BUN 25* 32*  CREATININE 1.07 1.14  CALCIUM 8.5* 8.7*   PT/INR No results for input(s): "LABPROT", "INR" in the last 72 hours. ABG No results for input(s): "PHART", "HCO3" in the last 72 hours.  Invalid input(s): "PCO2", "PO2"  Studies/Results: ECHOCARDIOGRAM COMPLETE  Result Date: 07/19/2022    ECHOCARDIOGRAM REPORT   Patient Name:   Nicholas Huffman Date of Exam: 07/19/2022 Medical Rec #:  [id number]     Height:       67.0 in Accession #:    [id number]    Weight:       159.6 lb Date of Birth:  06-19-1946     BSA:           1.837 m Patient Age:    76 years      BP:           129/78 mmHg Patient Gender: M             HR:           98 bpm. Exam Location:  Inpatient Procedure: 2D Echo, Cardiac Doppler, Color Doppler and Intracardiac            Opacification Agent Indications:    Atrial fibrillation  History:        Patient has no prior history of Echocardiogram examinations.                 Signs/Symptoms:Shortness of Breath.  Sonographer:    Merrie Roof RDCS Referring Phys: [place]  1. Left ventricular ejection fraction, by estimation, is 65 to 70%. The left ventricle has normal function. The left ventricle has no regional wall motion abnormalities. Left ventricular diastolic function could not be evaluated.  2. Right ventricular systolic function is normal. The right ventricular size is normal. Tricuspid regurgitation signal is inadequate for assessing PA pressure.  3. The mitral valve is normal in structure. No evidence of mitral valve regurgitation. No evidence of mitral stenosis.  4. The aortic valve was not well visualized. Aortic valve regurgitation is not visualized. No aortic stenosis is present.  5. Aortic dilatation noted. There is mild dilatation of the aortic root, measuring 38 mm. FINDINGS  Left Ventricle: Left ventricular ejection fraction, by estimation, is 65 to 70%. The left ventricle has normal function. The left ventricle has no regional wall motion abnormalities. Definity contrast agent was given IV to delineate the left ventricular  endocardial borders. The left ventricular internal cavity size was normal in size. There is no left ventricular hypertrophy. Left ventricular diastolic function could not be evaluated due to atrial fibrillation. Left ventricular diastolic function could  not be evaluated. Right Ventricle: The right ventricular size is normal. No increase in right ventricular wall thickness. Right ventricular systolic function is normal. Tricuspid regurgitation signal is  inadequate for assessing PA pressure. Left Atrium: Left atrial size was normal in size. Right Atrium: Right atrial size was normal in size. Pericardium: There is no evidence of pericardial effusion. Mitral Valve: The mitral valve is normal in structure. No evidence of mitral valve regurgitation. No evidence of mitral valve stenosis. Tricuspid Valve: The tricuspid valve is normal in structure. Tricuspid valve regurgitation is not demonstrated. No evidence of tricuspid stenosis. Aortic Valve: The aortic valve was not well visualized. Aortic valve regurgitation is not visualized. No aortic stenosis is present. Pulmonic Valve: The pulmonic valve was normal in structure. Pulmonic valve regurgitation is not visualized. No evidence of pulmonic stenosis. Aorta: Aortic dilatation noted. There is mild dilatation of the aortic root, measuring 38 mm. Venous: The inferior vena cava was not well visualized. IAS/Shunts: No atrial level shunt detected by color flow Doppler.  LEFT VENTRICLE PLAX 2D LVIDd:         4.80 cm LVIDs:         3.40 cm LV PW:         1.20 cm LV IVS:        0.90 cm LVOT diam:     2.20 cm LV SV:         44 LV SV Index:   24 LVOT Area:     3.80 cm  LEFT ATRIUM           Index        RIGHT ATRIUM           Index LA diam:      4.50 cm 2.45 cm/m   RA Area:     16.40 cm LA Vol (A4C): 76.0 ml 41.36 ml/m  RA Volume:   44.50 ml  24.22 ml/m  AORTIC VALVE LVOT Vmax:   87.40 cm/s LVOT Vmean:  61.000 cm/s LVOT VTI:    0.115 m  AORTA Ao Root diam: 3.80 cm Ao Asc diam:  3.30 cm  SHUNTS Systemic VTI:  0.12 m Systemic Diam: 2.20 cm Fransico Him MD Electronically signed by Fransico Him MD Signature Date/Time: 07/19/2022/5:12:13 PM    Final     Anti-infectives: Anti-infectives (From admission, onward)    Start     Dose/Rate Route Frequency Ordered Stop   07/17/22 0245  piperacillin-tazobactam (ZOSYN) IVPB 3.375 g        3.375 g 12.5 mL/hr over 240 Minutes Intravenous Every 8 hours 07/17/22 0158 07/24/22 0559    07/16/22 1900  piperacillin-tazobactam (ZOSYN) IVPB 3.375 g        3.375 g 100 mL/hr over 30 Minutes Intravenous  Once 07/16/22 1851 07/16/22 1931       Assessment/Plan: s/p Procedure(s): EXPLORATORY LAPAROTOMY, HARTMAN'S PROCEDURE (N/A) Perforated sigmoid diverticulitis with feculent peritonitis S/p  EXPLORATORY LAPAROTOMY, HARTMAN'S PROCEDURE 07/17/22 Dr. Redmond Pulling -  POD #3  - afebrile, HR 90s bpm irregular - on dilt, appreciate Cards input. Will start  - WBC trending down - continue JP drain  - await bowel function   Afib with RVR --on Dilt and will start Eliquis today HTN - PRN IV meds for now HLD  Tobacco use   FEN: FLD today, IVF at 50 cc/hr, aROBF ID: Zosyn 8/9 >>  Foley: D/C today VTE: SCD's, Lovenox Dispo: Progressive care, encourage IS every hour and wean O2 as able. Cards consult  LOS: 4 days    Ralene Ok 07/20/2022

## 2022-07-20 NOTE — Progress Notes (Signed)
Cardiology Progress Note  Patient ID: Nicholas Huffman MRN: [id number] DOB: September 30, 1946 Date of Encounter: 07/20/2022  Primary Cardiologist: None  Subjective   Chief Complaint: None.  HPI: Remains in rate controlled A-fib.  Eliquis okay for surgery.  Echo normal.  ROS:  All other ROS reviewed and negative. Pertinent positives noted in the HPI.     Inpatient Medications  Scheduled Meds:  acetaminophen  1,000 mg Oral Q6H   apixaban  5 mg Oral BID   Chlorhexidine Gluconate Cloth  6 each Topical Q0600   diltiazem  60 mg Oral Q6H   nicotine  21 mg Transdermal Daily   pantoprazole (PROTONIX) IV  40 mg Intravenous QHS   Continuous Infusions:  dextrose 5 % and 0.45 % NaCl with KCl 20 mEq/L 50 mL/hr at 07/20/22 0636   methocarbamol (ROBAXIN) IV     piperacillin-tazobactam (ZOSYN)  IV 3.375 g (07/20/22 0644)   PRN Meds: diphenhydrAMINE **OR** diphenhydrAMINE, methocarbamol **OR** methocarbamol (ROBAXIN) IV, morphine injection, ondansetron **OR** ondansetron (ZOFRAN) IV   Vital Signs   Vitals:   07/19/22 2345 07/20/22 0000 07/20/22 0410 07/20/22 0722  BP: 129/66 127/70 (!) 147/61 123/65  Pulse: 100 97 98 (!) 111  Resp: (!) 32 (!) 25 20 (!) 30  Temp: 98.2 F (36.8 C)   98.2 F (36.8 C)  TempSrc: Oral   Oral  SpO2: (!) 84% (!) 85% 90% 90%  Weight:      Height:        Intake/Output Summary (Last 24 hours) at 07/20/2022 1007 Last data filed at 07/20/2022 0644 Gross per 24 hour  Intake 300 ml  Output 900 ml  Net -600 ml      07/17/2022    1:43 AM 06/19/2022    8:44 AM 05/22/2022    9:01 AM  Last 3 Weights  Weight (lbs) 159 lb 9.8 oz 158 lb 158 lb 9.6 oz  Weight (kg) 72.4 kg 71.668 kg 71.94 kg      Telemetry  Overnight telemetry shows atrial fibrillation heart rate in 80s, which I personally reviewed.   ECG  The most recent ECG shows atrial fibrillation heart rate 112, nonspecific ST-T changes, which I personally reviewed.   Physical Exam   Vitals:   07/19/22 2345  07/20/22 0000 07/20/22 0410 07/20/22 0722  BP: 129/66 127/70 (!) 147/61 123/65  Pulse: 100 97 98 (!) 111  Resp: (!) 32 (!) 25 20 (!) 30  Temp: 98.2 F (36.8 C)   98.2 F (36.8 C)  TempSrc: Oral   Oral  SpO2: (!) 84% (!) 85% 90% 90%  Weight:      Height:        Intake/Output Summary (Last 24 hours) at 07/20/2022 1007 Last data filed at 07/20/2022 0644 Gross per 24 hour  Intake 300 ml  Output 900 ml  Net -600 ml       07/17/2022    1:43 AM 06/19/2022    8:44 AM 05/22/2022    9:01 AM  Last 3 Weights  Weight (lbs) 159 lb 9.8 oz 158 lb 158 lb 9.6 oz  Weight (kg) 72.4 kg 71.668 kg 71.94 kg    Body mass index is 25 kg/m.  General: Well nourished, well developed, in no acute distress Head: Atraumatic, normal size  Eyes: PEERLA, EOMI  Neck: Supple, no JVD Endocrine: No thryomegaly Cardiac: Normal S1, S2; irregular rhythm Lungs: Clear to auscultation bilaterally, no wheezing, rhonchi or rales  Abd: Soft, nontender, no hepatomegaly  Ext: No edema, pulses  2+ Musculoskeletal: No deformities, BUE and BLE strength normal and equal Skin: Warm and dry, no rashes   Neuro: Alert and oriented to person, place, time, and situation, CNII-XII grossly intact, no focal deficits  Psych: Normal mood and affect   Labs  High Sensitivity Troponin:  No results for input(s): "TROPONINIHS" in the last 720 hours.   Cardiac EnzymesNo results for input(s): "TROPONINI" in the last 168 hours. No results for input(s): "TROPIPOC" in the last 168 hours.  Chemistry Recent Labs  Lab 07/16/22 1234 07/17/22 0547 07/18/22 0053 07/19/22 0034 07/20/22 0015  NA 132*   < > 133* 131* 131*  K 3.2*   < > 4.2 3.8 3.9  CL 99   < > 101 102 101  CO2 23   < > 22 21* 21*  GLUCOSE 131*   < > 157* 150* 138*  BUN 29*   < > 21 25* 32*  CREATININE 1.46*   < > 1.20 1.07 1.14  CALCIUM 8.9   < > 8.7* 8.5* 8.7*  PROT 6.5  --   --   --   --   ALBUMIN 3.5  --   --   --   --   AST 19  --   --   --   --   ALT 18  --   --   --    --   ALKPHOS 67  --   --   --   --   BILITOT 1.0  --   --   --   --   GFRNONAA 50*   < > >60 >60 >60  ANIONGAP 10   < > '10 8 9   '$ < > = values in this interval not displayed.    Hematology Recent Labs  Lab 07/18/22 0053 07/19/22 0034 07/20/22 0015  WBC 16.7* 14.6* 14.4*  RBC 5.16 4.86 4.72  HGB 15.9 14.9 14.4  HCT 46.0 42.8 42.3  MCV 89.1 88.1 89.6  MCH 30.8 30.7 30.5  MCHC 34.6 34.8 34.0  RDW 14.6 14.6 14.7  PLT 242 290 326   BNPNo results for input(s): "BNP", "PROBNP" in the last 168 hours.  DDimer No results for input(s): "DDIMER" in the last 168 hours.   Radiology  ECHOCARDIOGRAM COMPLETE  Result Date: 07/19/2022    ECHOCARDIOGRAM REPORT   Patient Name:   Nicholas Huffman Date of Exam: 07/19/2022 Medical Rec #:  [id number]     Height:       67.0 in Accession #:    [id number]    Weight:       159.6 lb Date of Birth:  07-26-1946     BSA:          1.837 m Patient Age:    76 years      BP:           129/78 mmHg Patient Gender: M             HR:           98 bpm. Exam Location:  Inpatient Procedure: 2D Echo, Cardiac Doppler, Color Doppler and Intracardiac            Opacification Agent Indications:    Atrial fibrillation  History:        Patient has no prior history of Echocardiogram examinations.                 Signs/Symptoms:Shortness of Breath.  Sonographer:    Merrie Roof RDCS Referring Phys:  [place] Left ventricular ejection fraction, by estimation, is 65 to 70%. The left ventricle has normal function. The left ventricle has no regional wall motion abnormalities. Left ventricular diastolic function could not be evaluated.  2. Right ventricular systolic function is normal. The right ventricular size is normal. Tricuspid regurgitation signal is inadequate for assessing PA pressure.  3. The mitral valve is normal in structure. No evidence of mitral valve regurgitation. No evidence of mitral stenosis.  4. The aortic valve was not well visualized. Aortic valve  regurgitation is not visualized. No aortic stenosis is present.  5. Aortic dilatation noted. There is mild dilatation of the aortic root, measuring 38 mm. FINDINGS  Left Ventricle: Left ventricular ejection fraction, by estimation, is 65 to 70%. The left ventricle has normal function. The left ventricle has no regional wall motion abnormalities. Definity contrast agent was given IV to delineate the left ventricular  endocardial borders. The left ventricular internal cavity size was normal in size. There is no left ventricular hypertrophy. Left ventricular diastolic function could not be evaluated due to atrial fibrillation. Left ventricular diastolic function could  not be evaluated. Right Ventricle: The right ventricular size is normal. No increase in right ventricular wall thickness. Right ventricular systolic function is normal. Tricuspid regurgitation signal is inadequate for assessing PA pressure. Left Atrium: Left atrial size was normal in size. Right Atrium: Right atrial size was normal in size. Pericardium: There is no evidence of pericardial effusion. Mitral Valve: The mitral valve is normal in structure. No evidence of mitral valve regurgitation. No evidence of mitral valve stenosis. Tricuspid Valve: The tricuspid valve is normal in structure. Tricuspid valve regurgitation is not demonstrated. No evidence of tricuspid stenosis. Aortic Valve: The aortic valve was not well visualized. Aortic valve regurgitation is not visualized. No aortic stenosis is present. Pulmonic Valve: The pulmonic valve was normal in structure. Pulmonic valve regurgitation is not visualized. No evidence of pulmonic stenosis. Aorta: Aortic dilatation noted. There is mild dilatation of the aortic root, measuring 38 mm. Venous: The inferior vena cava was not well visualized. IAS/Shunts: No atrial level shunt detected by color flow Doppler.  LEFT VENTRICLE PLAX 2D LVIDd:         4.80 cm LVIDs:         3.40 cm LV PW:         1.20 cm LV  IVS:        0.90 cm LVOT diam:     2.20 cm LV SV:         44 LV SV Index:   24 LVOT Area:     3.80 cm  LEFT ATRIUM           Index        RIGHT ATRIUM           Index LA diam:      4.50 cm 2.45 cm/m   RA Area:     16.40 cm LA Vol (A4C): 76.0 ml 41.36 ml/m  RA Volume:   44.50 ml  24.22 ml/m  AORTIC VALVE LVOT Vmax:   87.40 cm/s LVOT Vmean:  61.000 cm/s LVOT VTI:    0.115 m  AORTA Ao Root diam: 3.80 cm Ao Asc diam:  3.30 cm  SHUNTS Systemic VTI:  0.12 m Systemic Diam: 2.20 cm Fransico Him MD Electronically signed by Fransico Him MD Signature Date/Time: 07/19/2022/5:12:13 PM    Final     Cardiac Studies  TTE 07/19/2022  1.  Left ventricular ejection fraction, by estimation, is 65 to 70%. The  left ventricle has normal function. The left ventricle has no regional  wall motion abnormalities. Left ventricular diastolic function could not  be evaluated.   2. Right ventricular systolic function is normal. The right ventricular  size is normal. Tricuspid regurgitation signal is inadequate for assessing  PA pressure.   3. The mitral valve is normal in structure. No evidence of mitral valve  regurgitation. No evidence of mitral stenosis.   4. The aortic valve was not well visualized. Aortic valve regurgitation  is not visualized. No aortic stenosis is present.   5. Aortic dilatation noted. There is mild dilatation of the aortic root,  measuring 38 mm.   Patient Profile  Nicholas Huffman is a 76 y.o. male with hypertension, hyperlipidemia who was admitted on 07/16/2022 with perforated diverticulitis status post exploratory laparotomy.  Cardiology is consulted on 07/19/2022 for atrial fibrillation which is new.  Assessment & Plan   #New onset atrial fibrillation -Developed A-fib in the early hours of 07/18/2022.  Has been in this for over 48 hours. -He will need to be rate controlled.  Transition off diltiazem drip.  Diltiazem short-acting 60 mg every 6 hours. -Since he has been in this for more than 48  hours he will need to be anticoagulated and pursuing outpatient cardioversion.  Given recent surgery he will need to recover anyway.  Surgery is okay with Eliquis 5 mg twice daily.  Continue this for now. -His echocardiogram shows normal LV function.  No significant valvular pathology.  No history of A-fib.  I will check a TSH tomorrow. -Ultimately I believe the best course of action is rate control strategy and then to pursue an outpatient cardioversion in several weeks.  There is no rush to do this.  He may go back into rhythm as he recovers.    For questions or updates, please contact [place] Please consult [email or web address] for contact info under   Signed, [place] T. Audie Box, MD, [place]  07/20/2022 10:07 AM

## 2022-07-20 NOTE — Discharge Instructions (Signed)

## 2022-07-21 LAB — CBC
HCT: 42.9 % (ref 39.0–52.0)
Hemoglobin: 14.8 g/dL (ref 13.0–17.0)
MCH: 30.8 pg (ref 26.0–34.0)
MCHC: 34.5 g/dL (ref 30.0–36.0)
MCV: 89.2 fL (ref 80.0–100.0)
Platelets: 385 10*3/uL (ref 150–400)
RBC: 4.81 MIL/uL (ref 4.22–5.81)
RDW: 14.6 % (ref 11.5–15.5)
WBC: 13.3 10*3/uL — ABNORMAL HIGH (ref 4.0–10.5)
nRBC: 0 % (ref 0.0–0.2)

## 2022-07-21 LAB — TSH: TSH: 4.351 u[IU]/mL (ref 0.350–4.500)

## 2022-07-21 LAB — BASIC METABOLIC PANEL
Anion gap: 8 (ref 5–15)
BUN: 32 mg/dL — ABNORMAL HIGH (ref 8–23)
CO2: 22 mmol/L (ref 22–32)
Calcium: 8.9 mg/dL (ref 8.9–10.3)
Chloride: 103 mmol/L (ref 98–111)
Creatinine, Ser: 1.15 mg/dL (ref 0.61–1.24)
GFR, Estimated: >60 mL/min (ref >60)
Glucose, Bld: 127 mg/dL — ABNORMAL HIGH (ref 70–99)
Potassium: 4.1 mmol/L (ref 3.5–5.1)
Sodium: 133 mmol/L — ABNORMAL LOW (ref 135–145)

## 2022-07-21 LAB — MAGNESIUM: Magnesium: 1.9 mg/dL (ref 1.7–2.4)

## 2022-07-21 MED ORDER — MAGNESIUM SULFATE 2 GM/50ML IV SOLN
2.0000 g | Freq: Once | INTRAVENOUS | Status: AC
  Administered 2022-07-21: 2 g via INTRAVENOUS
  Filled 2022-07-21: qty 50

## 2022-07-21 MED ORDER — METOPROLOL TARTRATE 50 MG PO TABS
50.0000 mg | ORAL_TABLET | Freq: Two times a day (BID) | ORAL | Status: DC
  Administered 2022-07-21 – 2022-07-23 (×5): 50 mg via ORAL
  Filled 2022-07-21 (×5): qty 1

## 2022-07-21 MED ORDER — DILTIAZEM HCL 60 MG PO TABS
60.0000 mg | ORAL_TABLET | Freq: Once | ORAL | Status: AC
  Administered 2022-07-21: 60 mg via ORAL
  Filled 2022-07-21: qty 1

## 2022-07-21 NOTE — Progress Notes (Signed)
Cardiology paged and notified of patient sustaining 130s-160 Afib.  Mostly occurring when patient sits on edge of bed to urinate using urinal.  Heart rate increases and patient very short of breath but maintaining sats 94% on 3L [place].  Patient does not have any complaints about pain or shortness of breath.  When asked if he feels worn out from getting up and sitting on side of bed he does agree. See new orders from MD.

## 2022-07-21 NOTE — Progress Notes (Signed)
Patient sitting on side of bed resting.  Heart rate 160s Afib.  Patient short of breath and fatigued.  RN helped patient back into bed to lay down.

## 2022-07-21 NOTE — Progress Notes (Signed)
Cardiology Progress Note  Patient ID: ALEJOS REINHARDT MRN: [id number] DOB: [birth year] Date of Encounter: 07/21/2022  Primary Cardiologist: None  Subjective   Chief Complaint: None.   HPI: Rate still not controlled on oral diltiazem.  Metoprolol added.  Denies any symptoms.  ROS:  All other ROS reviewed and negative. Pertinent positives noted in the HPI.     Inpatient Medications  Scheduled Meds:  acetaminophen  1,000 mg Oral Q6H   apixaban  5 mg Oral BID   Chlorhexidine Gluconate Cloth  6 each Topical Q0600   diltiazem  60 mg Oral Q6H   metoprolol tartrate  50 mg Oral BID   nicotine  21 mg Transdermal Daily   pantoprazole (PROTONIX) IV  40 mg Intravenous QHS   Continuous Infusions:  dextrose 5 % and 0.45 % NaCl with KCl 20 mEq/L 50 mL/hr at 07/20/22 0636   methocarbamol (ROBAXIN) IV     piperacillin-tazobactam (ZOSYN)  IV 3.375 g (07/21/22 0553)   PRN Meds: diphenhydrAMINE **OR** diphenhydrAMINE, methocarbamol **OR** methocarbamol (ROBAXIN) IV, morphine injection, ondansetron **OR** ondansetron (ZOFRAN) IV   Vital Signs   Vitals:   07/20/22 2007 07/21/22 0000 07/21/22 0400 07/21/22 0727  BP: 139/84 (!) 142/78 117/67 130/81  Pulse: 98 (!) 106 95 (!) 108  Resp: (!) 22 (!) 22 (!) 25 20  Temp: 98.4 F (36.9 C) 98.3 F (36.8 C) 98.3 F (36.8 C) (!) 97.5 F (36.4 C)  TempSrc: Oral Axillary Oral Oral  SpO2: 92% 94% 93% 92%  Weight:      Height:        Intake/Output Summary (Last 24 hours) at 07/21/2022 0842 Last data filed at 07/21/2022 0422 Gross per 24 hour  Intake 380 ml  Output 1320 ml  Net -940 ml      07/17/2022    1:43 AM 06/19/2022    8:44 AM 05/22/2022    9:01 AM  Last 3 Weights  Weight (lbs) 159 lb 9.8 oz 158 lb 158 lb 9.6 oz  Weight (kg) 72.4 kg 71.668 kg 71.94 kg      Telemetry  Overnight telemetry shows A-fib heart rates up to the 160s, which I personally reviewed.   Physical Exam   Vitals:   07/20/22 2007 07/21/22 0000 07/21/22 0400 07/21/22  0727  BP: 139/84 (!) 142/78 117/67 130/81  Pulse: 98 (!) 106 95 (!) 108  Resp: (!) 22 (!) 22 (!) 25 20  Temp: 98.4 F (36.9 C) 98.3 F (36.8 C) 98.3 F (36.8 C) (!) 97.5 F (36.4 C)  TempSrc: Oral Axillary Oral Oral  SpO2: 92% 94% 93% 92%  Weight:      Height:        Intake/Output Summary (Last 24 hours) at 07/21/2022 0842 Last data filed at 07/21/2022 0422 Gross per 24 hour  Intake 380 ml  Output 1320 ml  Net -940 ml       07/17/2022    1:43 AM 06/19/2022    8:44 AM 05/22/2022    9:01 AM  Last 3 Weights  Weight (lbs) 159 lb 9.8 oz 158 lb 158 lb 9.6 oz  Weight (kg) 72.4 kg 71.668 kg 71.94 kg    Body mass index is 25 kg/m.  General: Well nourished, well developed, in no acute distress Head: Atraumatic, normal size  Eyes: PEERLA, EOMI  Neck: Supple, no JVD Endocrine: No thryomegaly Cardiac: Normal S1, S2; irregular rhythm no murmurs Lungs: Clear to auscultation bilaterally, no wheezing, rhonchi or rales  Abd: Distended abdomen Ext: No  edema, pulses 2+ Musculoskeletal: No deformities, BUE and BLE strength normal and equal Skin: Warm and dry, no rashes   Neuro: Alert and oriented to person, place, time, and situation, CNII-XII grossly intact, no focal deficits  Psych: Normal mood and affect   Labs  High Sensitivity Troponin:  No results for input(s): "TROPONINIHS" in the last 720 hours.   Cardiac EnzymesNo results for input(s): "TROPONINI" in the last 168 hours. No results for input(s): "TROPIPOC" in the last 168 hours.  Chemistry Recent Labs  Lab 07/16/22 1234 07/17/22 0547 07/19/22 0034 07/20/22 0015 07/21/22 0008  NA 132*   < > 131* 131* 133*  K 3.2*   < > 3.8 3.9 4.1  CL 99   < > 102 101 103  CO2 23   < > 21* 21* 22  GLUCOSE 131*   < > 150* 138* 127*  BUN 29*   < > 25* 32* 32*  CREATININE 1.46*   < > 1.07 1.14 1.15  CALCIUM 8.9   < > 8.5* 8.7* 8.9  PROT 6.5  --   --   --   --   ALBUMIN 3.5  --   --   --   --   AST 19  --   --   --   --   ALT 18  --   --    --   --   ALKPHOS 67  --   --   --   --   BILITOT 1.0  --   --   --   --   GFRNONAA 50*   < > >60 >60 >60  ANIONGAP 10   < > '8 9 8   '$ < > = values in this interval not displayed.    Hematology Recent Labs  Lab 07/19/22 0034 07/20/22 0015 07/21/22 0008  WBC 14.6* 14.4* 13.3*  RBC 4.86 4.72 4.81  HGB 14.9 14.4 14.8  HCT 42.8 42.3 42.9  MCV 88.1 89.6 89.2  MCH 30.7 30.5 30.8  MCHC 34.8 34.0 34.5  RDW 14.6 14.7 14.6  PLT 290 326 385   BNPNo results for input(s): "BNP", "PROBNP" in the last 168 hours.  DDimer No results for input(s): "DDIMER" in the last 168 hours.   Radiology  ECHOCARDIOGRAM COMPLETE  Result Date: 07/19/2022    ECHOCARDIOGRAM REPORT   Patient Name:   BIJAN RIDGLEY Date of Exam: 07/19/2022 Medical Rec #:  [id number]     Height:       67.0 in Accession #:    [id number]    Weight:       159.6 lb Date of Birth:  03/10/1946     BSA:          1.837 m Patient Age:    76 years      BP:           129/78 mmHg Patient Gender: M             HR:           98 bpm. Exam Location:  Inpatient Procedure: 2D Echo, Cardiac Doppler, Color Doppler and Intracardiac            Opacification Agent Indications:    Atrial fibrillation  History:        Patient has no prior history of Echocardiogram examinations.                 Signs/Symptoms:Shortness of Breath.  Sonographer:    [place]  Referring Phys: [place]  1. Left ventricular ejection fraction, by estimation, is 65 to 70%. The left ventricle has normal function. The left ventricle has no regional wall motion abnormalities. Left ventricular diastolic function could not be evaluated.  2. Right ventricular systolic function is normal. The right ventricular size is normal. Tricuspid regurgitation signal is inadequate for assessing PA pressure.  3. The mitral valve is normal in structure. No evidence of mitral valve regurgitation. No evidence of mitral stenosis.  4. The aortic valve was not well visualized. Aortic valve  regurgitation is not visualized. No aortic stenosis is present.  5. Aortic dilatation noted. There is mild dilatation of the aortic root, measuring 38 mm. FINDINGS  Left Ventricle: Left ventricular ejection fraction, by estimation, is 65 to 70%. The left ventricle has normal function. The left ventricle has no regional wall motion abnormalities. Definity contrast agent was given IV to delineate the left ventricular  endocardial borders. The left ventricular internal cavity size was normal in size. There is no left ventricular hypertrophy. Left ventricular diastolic function could not be evaluated due to atrial fibrillation. Left ventricular diastolic function could  not be evaluated. Right Ventricle: The right ventricular size is normal. No increase in right ventricular wall thickness. Right ventricular systolic function is normal. Tricuspid regurgitation signal is inadequate for assessing PA pressure. Left Atrium: Left atrial size was normal in size. Right Atrium: Right atrial size was normal in size. Pericardium: There is no evidence of pericardial effusion. Mitral Valve: The mitral valve is normal in structure. No evidence of mitral valve regurgitation. No evidence of mitral valve stenosis. Tricuspid Valve: The tricuspid valve is normal in structure. Tricuspid valve regurgitation is not demonstrated. No evidence of tricuspid stenosis. Aortic Valve: The aortic valve was not well visualized. Aortic valve regurgitation is not visualized. No aortic stenosis is present. Pulmonic Valve: The pulmonic valve was normal in structure. Pulmonic valve regurgitation is not visualized. No evidence of pulmonic stenosis. Aorta: Aortic dilatation noted. There is mild dilatation of the aortic root, measuring 38 mm. Venous: The inferior vena cava was not well visualized. IAS/Shunts: No atrial level shunt detected by color flow Doppler.  LEFT VENTRICLE PLAX 2D LVIDd:         4.80 cm LVIDs:         3.40 cm LV PW:         1.20 cm LV  IVS:        0.90 cm LVOT diam:     2.20 cm LV SV:         44 LV SV Index:   24 LVOT Area:     3.80 cm  LEFT ATRIUM           Index        RIGHT ATRIUM           Index LA diam:      4.50 cm 2.45 cm/m   RA Area:     16.40 cm LA Vol (A4C): 76.0 ml 41.36 ml/m  RA Volume:   44.50 ml  24.22 ml/m  AORTIC VALVE LVOT Vmax:   87.40 cm/s LVOT Vmean:  61.000 cm/s LVOT VTI:    0.115 m  AORTA Ao Root diam: 3.80 cm Ao Asc diam:  3.30 cm  SHUNTS Systemic VTI:  0.12 m Systemic Diam: 2.20 cm Fransico Him MD Electronically signed by Fransico Him MD Signature Date/Time: 07/19/2022/5:12:13 PM    Final     Cardiac Studies  TTE 07/19/2022  1. Left ventricular ejection fraction, by estimation, is 65 to 70%. The  left ventricle has normal function. The left ventricle has no regional  wall motion abnormalities. Left ventricular diastolic function could not  be evaluated.   2. Right ventricular systolic function is normal. The right ventricular  size is normal. Tricuspid regurgitation signal is inadequate for assessing  PA pressure.   3. The mitral valve is normal in structure. No evidence of mitral valve  regurgitation. No evidence of mitral stenosis.   4. The aortic valve was not well visualized. Aortic valve regurgitation  is not visualized. No aortic stenosis is present.   5. Aortic dilatation noted. There is mild dilatation of the aortic root,  measuring 38 mm.  Patient Profile  GREYSEN DEVINO is a 76 y.o. male with hypertension, hyperlipidemia who was admitted on 07/16/2022 with perforated diverticulitis status post exploratory laparotomy.  Cardiology is consulted on 07/19/2022 for atrial fibrillation which is new.  Assessment & Plan   #New onset atrial fibrillation -Currently admitted to the hospital with perforated diverticulitis status post exploratory laparotomy. -Developed atrial fibrillation on 07/18/2022. -Cardiology was consulted on 07/19/2022. -Rates have been difficult to control.  Currently on  diltiazem 60 mg every 6 hours.  We will add metoprolol to tartrate 50 mg twice daily. -Eliquis has been cleared by surgery. -Suspect all of his rates are related to his recent surgery and illness.  Currently on antibiotics for perforated diverticulitis.  Status post surgical intervention. -Would recommend to continue with rate control strategy.  Can pursue an outpatient cardioversion once he is much more improved. -His echo shows normal LV function.  TSH is normal. -Cardiology will follow along to help with rate control    For questions or updates, please contact [place] Please consult [email or web address] for contact info under   Signed, [place] B[place] T. Audie Box, MD, [place]  07/21/2022 8:42 AM

## 2022-07-22 ENCOUNTER — Telehealth (HOSPITAL_COMMUNITY): Payer: Self-pay | Admitting: Pharmacy Technician

## 2022-07-22 ENCOUNTER — Other Ambulatory Visit (HOSPITAL_COMMUNITY): Payer: Self-pay

## 2022-07-22 DIAGNOSIS — I4819 Other persistent atrial fibrillation: Secondary | ICD-10-CM

## 2022-07-22 MED ORDER — MORPHINE SULFATE (PF) 2 MG/ML IV SOLN: 1.0000 mg | INTRAVENOUS | Status: DC | PRN

## 2022-07-22 MED ORDER — ENSURE ENLIVE PO LIQD
237.0000 mL | Freq: Two times a day (BID) | ORAL | Status: DC
  Administered 2022-07-22 – 2022-07-23 (×2): 237 mL via ORAL

## 2022-07-22 MED ORDER — OXYCODONE HCL 5 MG PO TABS: 5.0000 mg | ORAL_TABLET | ORAL | Status: DC | PRN

## 2022-07-22 NOTE — Telephone Encounter (Signed)
Pharmacy Patient Advocate Encounter  Insurance verification completed.    The patient is insured through [employer]   The patient is currently admitted and ran test claims for the following: Eliquis.  Copays and coinsurance results were relayed to Inpatient clinical team. 

## 2022-07-22 NOTE — Progress Notes (Signed)
Mobility Specialist Progress Note    07/22/22 1534  Mobility  Activity Ambulated with assistance in hallway  Level of Assistance Contact guard assist, steadying assist  Assistive Device Other (Comment) (HHA)  Distance Ambulated (ft) 650 ft  Activity Response Tolerated well  $Mobility charge 1 Mobility   Pre-Mobility: 53 HR, 93% SpO2 Post-Mobility: 51 HR, 93% SpO2  Pt received in chair and agreeable. No complaints on walk. Able to wean to 1LO2 maintaining SpO2 90-92%. Returned to chair with call bell in reach.  Left on 1LO2, RN advised.   Hildred Alamin Mobility Specialist

## 2022-07-22 NOTE — Progress Notes (Signed)
Progress Note  Patient Name: Nicholas Huffman Date of Encounter: 07/22/2022  [facility] Cardiologist: Reola Calkins Stanford Breed)  Subjective   Taking full liquid diet, would like to progress to a full diet.  Unaware of arrhythmia.  No cardiovascular complaints. Remains in atrial fibrillation but rate control is greatly improved on combination metoprolol and diltiazem.  Inpatient Medications    Scheduled Meds:  acetaminophen  1,000 mg Oral Q6H   apixaban  5 mg Oral BID   diltiazem  60 mg Oral Q6H   feeding supplement  237 mL Oral BID BM   metoprolol tartrate  50 mg Oral BID   nicotine  21 mg Transdermal Daily   pantoprazole (PROTONIX) IV  40 mg Intravenous QHS   Continuous Infusions:  methocarbamol (ROBAXIN) IV     piperacillin-tazobactam (ZOSYN)  IV 12.5 mL/hr at 07/22/22 0705   PRN Meds: diphenhydrAMINE **OR** diphenhydrAMINE, methocarbamol **OR** methocarbamol (ROBAXIN) IV, morphine injection, ondansetron **OR** ondansetron (ZOFRAN) IV, oxyCODONE   Vital Signs    Vitals:   07/21/22 2206 07/22/22 0000 07/22/22 0400 07/22/22 0712  BP:  126/61 125/66 107/78  Pulse: 83 74 69 82  Resp:  20 (!) 22 (!) 23  Temp:  (!) 97.3 F (36.3 C) 97.7 F (36.5 C) 97.7 F (36.5 C)  TempSrc:  Oral Oral Oral  SpO2:  93% 92% 93%  Weight:      Height:        Intake/Output Summary (Last 24 hours) at 07/22/2022 1046 Last data filed at 07/22/2022 0705 Gross per 24 hour  Intake 5779.76 ml  Output 710 ml  Net 5069.76 ml      07/17/2022    1:43 AM 06/19/2022    8:44 AM 05/22/2022    9:01 AM  Last 3 Weights  Weight (lbs) 159 lb 9.8 oz 158 lb 158 lb 9.6 oz  Weight (kg) 72.4 kg 71.668 kg 71.94 kg      Telemetry    Atrial fibrillation ventricular rate in the 80s at rest, 90-low 100s with activity- Personally Reviewed  ECG    Atrial fibrillation with RVR- Personally Reviewed  Physical Exam   GEN: No acute distress.   Neck: No JVD Cardiac: Irregular, no murmurs, rubs, or gallops.   Respiratory: Clear to auscultation bilaterally. GI: Soft, nontender, non-distended  MS: No edema; No deformity. Neuro:  Nonfocal  Psych: Normal affect   Labs    High Sensitivity Troponin:  No results for input(s): "TROPONINIHS" in the last 720 hours.   Chemistry Recent Labs  Lab 07/16/22 1234 07/17/22 0547 07/19/22 0034 07/20/22 0015 07/21/22 0008  NA 132*   < > 131* 131* 133*  K 3.2*   < > 3.8 3.9 4.1  CL 99   < > 102 101 103  CO2 23   < > 21* 21* 22  GLUCOSE 131*   < > 150* 138* 127*  BUN 29*   < > 25* 32* 32*  CREATININE 1.46*   < > 1.07 1.14 1.15  CALCIUM 8.9   < > 8.5* 8.7* 8.9  MG  --    < > 1.9 1.9 1.9  PROT 6.5  --   --   --   --   ALBUMIN 3.5  --   --   --   --   AST 19  --   --   --   --   ALT 18  --   --   --   --   ALKPHOS 67  --   --   --   --  BILITOT 1.0  --   --   --   --   GFRNONAA 50*   < > >60 >60 >60  ANIONGAP 10   < > '8 9 8   '$ < > = values in this interval not displayed.    Lipids No results for input(s): "CHOL", "TRIG", "HDL", "LABVLDL", "LDLCALC", "CHOLHDL" in the last 168 hours.  Hematology Recent Labs  Lab 07/19/22 0034 07/20/22 0015 07/21/22 0008  WBC 14.6* 14.4* 13.3*  RBC 4.86 4.72 4.81  HGB 14.9 14.4 14.8  HCT 42.8 42.3 42.9  MCV 88.1 89.6 89.2  MCH 30.7 30.5 30.8  MCHC 34.8 34.0 34.5  RDW 14.6 14.7 14.6  PLT 290 326 385   Thyroid  Recent Labs  Lab 07/21/22 0008  TSH 4.351    BNPNo results for input(s): "BNP", "PROBNP" in the last 168 hours.  DDimer No results for input(s): "DDIMER" in the last 168 hours.   Radiology    No results found.  Cardiac Studies   TTE 07/19/2022  1. Left ventricular ejection fraction, by estimation, is 65 to 70%. The  left ventricle has normal function. The left ventricle has no regional  wall motion abnormalities. Left ventricular diastolic function could not  be evaluated.   2. Right ventricular systolic function is normal. The right ventricular  size is normal. Tricuspid regurgitation  signal is inadequate for assessing  PA pressure.   3. The mitral valve is normal in structure. No evidence of mitral valve  regurgitation. No evidence of mitral stenosis.   4. The aortic valve was not well visualized. Aortic valve regurgitation  is not visualized. No aortic stenosis is present.   5. Aortic dilatation noted. There is mild dilatation of the aortic root,  measuring 38 mm.  Patient Profile     76 y.o. male with hypertension and hyperlipidemia, but without known structural heart disease or vascular disease, developed new atrial fibrillation rapid ventricular response in the setting of perforated diverticulitis requiring exploratory laparotomy.  Assessment & Plan    Well rate controlled on the combination of metoprolol and diltiazem.  We will change the diltiazem to equivalent long-acting dose.  Current rate control medications are also providing adequate blood pressure management. On oral anticoagulants. Consider cardioversion after a minimum of 3 weeks of uninterrupted oral anticoagulant. [facility] will sign off.   Medication Recommendations: Eliquis 5 mg twice daily, metoprolol tartrate 50 mg twice daily, diltiazem sustained-release 240 mg once daily.  Stop amlodipine.  Stop aspirin.  Stop losartan hydrochlorothiazide.  Continue atorvastatin 40 mg once daily Other recommendations (labs, testing, etc):  n/a Follow up as an outpatient: We will make arrangements for follow-up in clinic in 3-4 weeks to discuss cardioversion versus continued rate control management  For questions or updates, please contact [place] Please consult [email or web address] for contact info under        Signed, Sanda Klein, MD  07/22/2022, 10:46 AM

## 2022-07-22 NOTE — TOC Benefit Eligibility Note (Signed)
Patient Teacher, English as a foreign language completed.    The patient is currently admitted and upon discharge could be taking Eliquis 5 mg.  The current 30 day co-pay is $162.86.   The patient is insured through [place], [place] Patient [place] Patient Advocate Team Direct Number: [phone]  Fax: [phone]

## 2022-07-22 NOTE — Progress Notes (Signed)
Colostomy bag  burst out, incision site covered with greenish and blackish  drainage from colostomy. Site irrigated with saline and cleansed well. Wet to dry dressing applied tolerated well.  Colostomy bag changed.

## 2022-07-22 NOTE — Progress Notes (Signed)
6 Days Post-Op   Subjective/Chief Complaint: Doesn't like FLD, wants more.  Some air in ostomy, but still no other output. Feels a lot of movement in his abdomen.  Ambulating.  Smoker and on 4L O2.  Doesn't wear O2 at home.   Objective: Vital signs in last 24 hours: Temp:  [97.3 F (36.3 C)-98.1 F (36.7 C)] 97.7 F (36.5 C) (08/14 0712) Pulse Rate:  [69-94] 82 (08/14 0712) Resp:  [20-23] 23 (08/14 0712) BP: (107-126)/(61-78) 107/78 (08/14 0712) SpO2:  [92 %-93 %] 93 % (08/14 0712) Last BM Date :  (colostomy)  Intake/Output from previous day: 08/13 0701 - 08/14 0700 In: 5354.8 [P.O.:330; I.V.:4609; IV Piggyback:415.8] Out: 1240 [Urine:900; Drains:330; Stool:10] Intake/Output this shift: Total I/O In: 424.9 [I.V.:340; IV Piggyback:84.9] Out: -   PE: Lungs: Clear to auscultation bilaterally, normal respiratory effort, on 4L, wean FA:[id number] GI: Soft, great BS, colostomy with viable stoma.  Some air in ostomy, but no feculent output.  Midline wound is clean and packed with goo granulation tissue.  JP with serous fluid    Lab Results:  Recent Labs    07/20/22 0015 07/21/22 0008  WBC 14.4* 13.3*  HGB 14.4 14.8  HCT 42.3 42.9  PLT 326 385   BMET Recent Labs    07/20/22 0015 07/21/22 0008  NA 131* 133*  K 3.9 4.1  CL 101 103  CO2 21* 22  GLUCOSE 138* 127*  BUN 32* 32*  CREATININE 1.14 1.15  CALCIUM 8.7* 8.9   PT/INR No results for input(s): "LABPROT", "INR" in the last 72 hours. ABG No results for input(s): "PHART", "HCO3" in the last 72 hours.  Invalid input(s): "PCO2", "PO2"  Studies/Results: No results found.  Anti-infectives: Anti-infectives (From admission, onward)    Start     Dose/Rate Route Frequency Ordered Stop   07/17/22 0245  piperacillin-tazobactam (ZOSYN) IVPB 3.375 g        3.375 g 12.5 mL/hr over 240 Minutes Intravenous Every 8 hours 07/17/22 0158 07/24/22 0559   07/16/22 1900  piperacillin-tazobactam (ZOSYN) IVPB 3.375 g         3.375 g 100 mL/hr over 30 Minutes Intravenous  Once 07/16/22 1851 07/16/22 1931       Assessment/Plan: POD 6, S/p  EXPLORATORY LAPAROTOMY, HARTMAN'S PROCEDURE 07/17/22 Dr. Redmond Pulling for Perforated sigmoid diverticulitis with feculent peritonitis - afebrile, HR 90s bpm irregular - on dilt and metoprolol added this am for better rate control - WBC trending down, check in am - continue JP drain  - await bowel function, but may have a soft diet and add ensure given great BS and no distention or nausea -abx 7 days post op and stop   Afib with RVR --on Dilt, metoprolol, and Eliquis  HTN - PRN IV meds for now HLD  Tobacco use - wean O2 to RA   FEN: soft, SLIV, ensure ID: Zosyn 8/9 >> 8/15 Foley: voiding on his own VTE: SCD's, Lovenox Dispo: med-surg tele floor written for, away colostomy function, wean O2, a fib med adjustments  LOS: 6 days    Henreitta Cea 07/22/2022

## 2022-07-22 NOTE — Care Management Important Message (Signed)
Important Message  Patient Details  Name: Nicholas Huffman MRN: [id number] Date of Birth: October 09, 1946   Medicare Important Message Given:  Yes     Orbie Pyo 07/22/2022, 2:19 PM

## 2022-07-22 NOTE — Care Management Important Message (Signed)
Important Message  Patient Details  Name: Nicholas Huffman MRN: [id number] Date of Birth: Nov 05, 1946   Medicare Important Message Given:  Yes     Orbie Pyo 07/22/2022, 2:17 PM

## 2022-07-23 ENCOUNTER — Other Ambulatory Visit (HOSPITAL_COMMUNITY): Payer: Self-pay

## 2022-07-23 LAB — CBC
HCT: 39.9 % (ref 39.0–52.0)
Hemoglobin: 13.5 g/dL (ref 13.0–17.0)
MCH: 30.6 pg (ref 26.0–34.0)
MCHC: 33.8 g/dL (ref 30.0–36.0)
MCV: 90.5 fL (ref 80.0–100.0)
Platelets: 419 10*3/uL — ABNORMAL HIGH (ref 150–400)
RBC: 4.41 MIL/uL (ref 4.22–5.81)
RDW: 14.7 % (ref 11.5–15.5)
WBC: 13 10*3/uL — ABNORMAL HIGH (ref 4.0–10.5)
nRBC: 0 % (ref 0.0–0.2)

## 2022-07-23 LAB — BASIC METABOLIC PANEL
Anion gap: 7 (ref 5–15)
BUN: 39 mg/dL — ABNORMAL HIGH (ref 8–23)
CO2: 22 mmol/L (ref 22–32)
Calcium: 8.5 mg/dL — ABNORMAL LOW (ref 8.9–10.3)
Chloride: 107 mmol/L (ref 98–111)
Creatinine, Ser: 1.26 mg/dL — ABNORMAL HIGH (ref 0.61–1.24)
GFR, Estimated: 59 mL/min — ABNORMAL LOW (ref >60)
Glucose, Bld: 93 mg/dL (ref 70–99)
Potassium: 3.9 mmol/L (ref 3.5–5.1)
Sodium: 136 mmol/L (ref 135–145)

## 2022-07-23 MED ORDER — NICOTINE 21 MG/24HR TD PT24: 21.0000 mg | MEDICATED_PATCH | Freq: Every day | TRANSDERMAL | 0 refills | Status: DC

## 2022-07-23 MED ORDER — ACETAMINOPHEN 500 MG PO TABS: 1000.0000 mg | ORAL_TABLET | Freq: Four times a day (QID) | ORAL | 0 refills | Status: DC | PRN

## 2022-07-23 MED ORDER — APIXABAN 5 MG PO TABS
5.0000 mg | ORAL_TABLET | Freq: Two times a day (BID) | ORAL | 0 refills | Status: DC
  Filled 2022-07-23: qty 60, 30d supply, fill #0

## 2022-07-23 MED ORDER — DILTIAZEM HCL ER 120 MG PO CP12
240.0000 mg | ORAL_CAPSULE | Freq: Every day | ORAL | 0 refills | Status: DC
  Filled 2022-07-23: qty 60, 30d supply, fill #0

## 2022-07-23 MED ORDER — TRAMADOL HCL 50 MG PO TABS
50.0000 mg | ORAL_TABLET | Freq: Four times a day (QID) | ORAL | 0 refills | Status: DC | PRN
  Filled 2022-07-23: qty 10, 3d supply, fill #0

## 2022-07-23 MED ORDER — METOPROLOL TARTRATE 50 MG PO TABS
50.0000 mg | ORAL_TABLET | Freq: Two times a day (BID) | ORAL | 0 refills | Status: DC
  Filled 2022-07-23: qty 60, 30d supply, fill #0

## 2022-07-23 NOTE — Final Consult Note (Signed)
[place] Nurse ostomy follow up Patient receiving care in [place] Patient discharging today.  Stoma type/location: LLQ colostomy Stomal assessment/size: 1 5/8 inch, pink, moist, budded.  Peristomal assessment: intact Treatment options for stomal/peristomal skin: barrier ring Output: mushy green black Ostomy pouching: 2pc. 2 3/4 inch Kellie Simmering # [phone]) skin barrier Kellie Simmering # 2) barrier rings Kellie Simmering # [phone]) Education provided: Wife present for education and teaching. Wife completed the entire pouch change with very little assistance.  Explained stoma characteristics (budded, flush, color, texture, care) Wife completed pouch change (cutting new skin barrier, cleaning peristomal skin and stoma, use of barrier ring) Education on emptying when 1/3 to 1/2 full and how to empty Demonstrated "burping" flatus from pouch Demonstrated use of wick to clean spout  Discussed diet, gas, constipation Handout given "Eating with an Ostomy" ([employer] [place].) www.ostomy.org (2022)    Discussed risk of peristomal hernia- wearing a binder Discussed treatment of peristomal skin (ostomy powder, skin barrier wipes) Answered patient/family questions:  Educational material given, discussed ostomy clinic. Enrolled patient in [place] Start Discharge program: Yes Had the secretary order more supplies for discharge.  I recommend/discussed the [facility] outpatient ostomy clinic as an outpatient resource.  IF MD agrees this would be beneficial, please fax referral, or enter electronically in Epic the referral. (Fax- [phone])   Thank you for the consult. [place] nurse will not follow at this time.   Please re-consult the [place] team if needed.  Cathlean Marseilles Tamala Julian, MSN, RN, [place], Lysle Pearl, [facility] Wound Treatment Associate Pager [phone]

## 2022-07-23 NOTE — Progress Notes (Signed)
7 Days Post-Op   Subjective/Chief Complaint: Eating very well.  Colostomy working well and busted overnight due to high output. Weaned to 1L, but I weaned to RA this am and he is maintaining 90-92% which is what he was on 1L.  PT to work with him this am.    Objective: Vital signs in last 24 hours: Temp:  [96.6 F (35.9 C)-98 F (36.7 C)] 97.3 F (36.3 C) (08/15 0737) Pulse Rate:  [61-86] 86 (08/15 0737) Resp:  [20-26] 23 (08/15 0737) BP: (93-115)/(58-74) 99/64 (08/15 0737) SpO2:  [92 %-93 %] 92 % (08/15 0737) Last BM Date :  (colostomy)  Intake/Output from previous day: 08/14 0701 - 08/15 0700 In: 1501.9 [P.O.:1077; I.V.:340; IV Piggyback:84.9] Out: 595 [Urine:500; Drains:95] Intake/Output this shift: Total I/O In: -  Out: 310 [Urine:100; Drains:110; Stool:100]  PE: Lungs: Clear to auscultation bilaterally, normal respiratory effort, on RA, 90-92% OV:ZCHYIFOYD, but in 80s GI: Soft, great BS, colostomy with viable stoma and feculent output.  Midline wound is clean and packed with goo granulation tissue.  JP with serous fluid    Lab Results:  Recent Labs    07/21/22 0008 07/23/22 0037  WBC 13.3* 13.0*  HGB 14.8 13.5  HCT 42.9 39.9  PLT 385 419*   BMET Recent Labs    07/21/22 0008 07/23/22 0037  NA 133* 136  K 4.1 3.9  CL 103 107  CO2 22 22  GLUCOSE 127* 93  BUN 32* 39*  CREATININE 1.15 1.26*  CALCIUM 8.9 8.5*   PT/INR No results for input(s): "LABPROT", "INR" in the last 72 hours. ABG No results for input(s): "PHART", "HCO3" in the last 72 hours.  Invalid input(s): "PCO2", "PO2"  Studies/Results: No results found.  Anti-infectives: Anti-infectives (From admission, onward)    Start     Dose/Rate Route Frequency Ordered Stop   07/17/22 0245  piperacillin-tazobactam (ZOSYN) IVPB 3.375 g        3.375 g 12.5 mL/hr over 240 Minutes Intravenous Every 8 hours 07/17/22 0158 07/23/22 2359   07/16/22 1900  piperacillin-tazobactam (ZOSYN) IVPB 3.375 g         3.375 g 100 mL/hr over 30 Minutes Intravenous  Once 07/16/22 1851 07/16/22 1931       Assessment/Plan: POD 7, S/p  EXPLORATORY LAPAROTOMY, HARTMAN'S PROCEDURE 07/17/22 Dr. Redmond Pulling for Perforated sigmoid diverticulitis with feculent peritonitis - afebrile, HR 80s bpm irregular - on dilt and metoprolol. - WBC stable at 13K - ? DC JP prior to DC -abx 7 days post op and stop -PT eval pending   Afib with RVR --on Dilt, metoprolol, and Eliquis.  BP a little soft this am.  Will monitor HTN - PRN IV meds for now HLD  Tobacco use - wean O2 to RA, await PT assessment for O2 recs at home    FEN: soft, SLIV, ensure ID: Zosyn 8/9 >> 8/15 Foley: voiding on his own VTE: SCD's, Lovenox Dispo: possible DC home today pending PT eval   LOS: 7 days    Henreitta Cea 07/23/2022

## 2022-07-23 NOTE — Discharge Summary (Signed)
Patient ID: Nicholas Huffman [id number] 12/25/1945 76 y.o.  Admit date: 07/16/2022 Discharge date: 07/23/2022  Admitting Diagnosis: perforated sigmoid diverticulitis with peritonitis AKI H/o HTN Tobacco use  Discharge Diagnosis Patient Active Problem List   Diagnosis Date Noted   Status post surgery 07/16/2022   Perforation of sigmoid colon due to diverticulitis 07/16/2022   History of total knee replacement, right 03/26/2018   Primary localized osteoarthritis of right knee 02/17/2018   Hyperlipidemia    Hypertension    Smoker   POD 7, S/p  EXPLORATORY LAPAROTOMY, HARTMAN'S PROCEDURE 07/17/22 Dr. Redmond Pulling for Perforated sigmoid diverticulitis with feculent peritonitis Afib with RVR   Will monitor HTN - PRN IV meds for now HLD  Tobacco use  Consultants Cardiology   Reason for Admission: 76 year old man with a history of hypertension hyperlipidemia and tobacco use states that he developed lower abdominal pain about 3 days ago.  It was pretty intense.  So on Sunday he tried to go to an urgent care but could not find one that was open he states.  He called his PCP when the pain did not get better and they advised him to go to urgent care.  He went to urgent care earlier today and after assessment he was immediately sent to the emergency room.  He states the pain eased up a little bit on Monday but returned.  He states he has had some mild nausea and vomiting.  No fever or chills.  Last bowel movement was Sunday.  He typically has a bowel movement maybe every other day.  No melena hematochezia.  He just had a colonoscopy by Dr. 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  He smokes 1 pack/day for many years.  He denies any chest pain, chest pressure, shortness of breath, dyspnea on exertion, claudication, TIAs or amaurosis fugax.  No prior abdominal surgery.  No alcohol use or drug use.  Lives with his wife.   Walks his dogs daily.  Procedures EXPLORATORY LAPAROTOMY, HARTMAN'S PROCEDURE (N/A) (Sigmoid colectomy with end colostomy), Dr. Redmond Pulling 07/17/22  Hospital Course:  POD 7, S/p  EXPLORATORY LAPAROTOMY, HARTMAN'S PROCEDURE 07/17/22 Dr. Redmond Pulling for Perforated sigmoid diverticulitis with feculent peritonitis The patient was admitted and underwent the above procedure.  His diet was able to be slowly advanced as he was waiting on his ileus to resolve.  As he began having bowel function, his colostomy started to function and was able to have a soft diet by the time of discharge.  He had colostomy and wound care instructions prior to discharge along with sessions with his wife.  She is comfortable with dressing changes and colostomy care at home.  He had a surgical JP drain that was removed on day of discharge and remained serous.  He also completed 7 days of post op abx therapy.  He was AF with a stable WBC of 13K at the time of discharge.   Afib with RVR  Cardiology was consulted and placed on a dilt gtt initially.  He was then converted to oral dilt and metoprolol was added for further rate control.  He was also started on Eliquis BID as well  HTN  PRN IV meds, but home meds were held and stopped at discharge due to above change in medications for his a fib.  HLD  Resume home meds at DC  Tobacco use  He require O2 post op,  but was able to wean off at this to RA at time of discharge.  He mobilized with therapies and no PT needs or O2 needs were found prior to discharge.  He was surgically and medically stable for DC home on 07/23/22 with appropriate follow up in place.  Allergies as of 07/23/2022   No Known Allergies      Medication List     STOP taking these medications    amLODipine 10 MG tablet Commonly known as: NORVASC   aspirin EC 81 MG tablet   losartan-hydrochlorothiazide 100-25 MG tablet Commonly known as: HYZAAR       TAKE these medications    acetaminophen 500 MG  tablet Commonly known as: TYLENOL Take 2 tablets (1,000 mg total) by mouth every 6 (six) hours as needed.   apixaban 5 MG Tabs tablet Commonly known as: ELIQUIS Take 1 tablet (5 mg total) by mouth 2 (two) times daily.   atorvastatin 40 MG tablet Commonly known as: LIPITOR Take 1 tablet (40 mg total) by mouth daily.   diltiazem 120 MG 12 hr capsule Commonly known as: CARDIZEM SR Take 2 capsules (240 mg total) by mouth daily.   metoprolol tartrate 50 MG tablet Commonly known as: LOPRESSOR Take 1 tablet (50 mg total) by mouth 2 (two) times daily.   traMADol 50 MG tablet Commonly known as: Ultram Take 1 tablet (50 mg total) by mouth every 6 (six) hours as needed.          Follow-up Information     Deberah Pelton, NP Follow up on 08/20/2022.   Specialty: Cardiology Why: '@3'$ :10pm for hospital follow up with Dr. Jacalyn Lefevre PA/NP Contact information: [place] [address] [phone]         Greer Pickerel, MD Follow up on 08/23/2022.   Specialty: General Surgery Why: 3:45 pm, Arrive 30 minutes prior to your appointment time, Please bring your insurance card and photo ID Contact information: [address] [place] [address] [phone]         Susy Frizzle, MD Follow up in 2 week(s).   Specialty: Family Medicine Why: post hospital follow up Contact information: [address] [place] [address] [phone]                 Signed: Saverio Danker, [facility] Surgery 07/23/2022, 11:53 AM Please see Amion for pager number during day hours 7:00am-4:30pm, 7-11:30am on Weekends

## 2022-07-23 NOTE — Progress Notes (Signed)
RN went over d/c summary w/ pt and pt's wife and removed PIVs. Belongings w/ pt. Pt's wife will transport pt home once TOC meds delivered.   Pt has TOC meds. NT transporting pt to private vehicle.

## 2022-07-23 NOTE — Progress Notes (Signed)
Remains in atrial fibrillation with controlled ventricular response.  No signs of bleeding. Appointment scheduled on 08/20/2022 to discuss cardioversion.

## 2022-07-23 NOTE — Evaluation (Signed)
Physical Therapy Evaluation & Discharge Patient Details Name: Nicholas Huffman MRN: [id number] DOB: August 31, 1946 Today's Date: 07/23/2022  History of Present Illness  Pt is a 76 y.o. male admitted 07/16/22 with perforated simoid diverticultis with feculent peritonitis. S/p exlap, Hartman's procedure (sigmoid colectomy with end colostomy) on 8/9. PMH includes HTN, HLD, R TKA (2019), HOH.   Clinical Impression  Patient evaluated by Physical Therapy with no further acute PT needs identified. PTA, pt independent, drives, retired from work, enjoys walking his dogs daily. Today, pt independent with mobility and ADL tasks. All education has been completed and the patient has no further questions. Acute PT is signing off. Thank you for this referral.    SATURATION QUALIFICATIONS:  Patient Saturations on Room Air at Rest = 92% Patient Saturations on Room Air while Ambulating = 94%    Recommendations for follow up therapy are one component of a multi-disciplinary discharge planning process, led by the attending physician.  Recommendations may be updated based on patient status, additional functional criteria and insurance authorization.  Follow Up Recommendations No PT follow up      Assistance Recommended at Discharge PRN  Patient can return home with the following  A little help with bathing/dressing/bathroom;Assistance with cooking/housework    Equipment Recommendations None recommended by PT  Recommendations for Other Services       Functional Status Assessment Patient has not had a recent decline in their functional status     Precautions / Restrictions Precautions Precautions: Other (comment) Precaution Comments: abdominal for comfort, colostomy, abdominal binder Restrictions Weight Bearing Restrictions: No      Mobility  Bed Mobility               General bed mobility comments: received sitting in recliner    Transfers Overall transfer level: Independent Equipment used:  None                    Ambulation/Gait Ambulation/Gait assistance: Independent Gait Distance (Feet): 500 Feet Assistive device: None Gait Pattern/deviations: Step-through pattern, Decreased stride length, Staggering left Gait velocity: Decreased     General Gait Details: slow, mostly steady gait indep without DME; pt with intermittent drift towards L-side, especially when turning L, but able to self-correct  Stairs            Wheelchair Mobility    Modified Rankin (Stroke Patients Only)       Balance Overall balance assessment: Independent Sitting-balance support: No upper extremity supported, Feet supported Sitting balance-Leahy Scale: Good     Standing balance support: No upper extremity supported, During functional activity Standing balance-Leahy Scale: Good                   Standardized Balance Assessment Standardized Balance Assessment : Dynamic Gait Index   Dynamic Gait Index Level Surface: Normal Change in Gait Speed: Mild Impairment Gait with Horizontal Head Turns: Normal Gait with Vertical Head Turns: Normal Gait and Pivot Turn: Mild Impairment Step Over Obstacle: Mild Impairment Step Around Obstacles: Normal Steps: Mild Impairment Total Score: 20       Pertinent Vitals/Pain Pain Assessment Pain Assessment: Faces Faces Pain Scale: Hurts a little bit Pain Location: abdomen Pain Descriptors / Indicators: Sore Pain Intervention(s): Monitored during session    Home Living Family/patient expects to be discharged to:: Private residence Living Arrangements: Spouse/significant other Available Help at Discharge: Family;Available 24 hours/day Type of Home: House Home Access: Stairs to enter Entrance Stairs-Rails: Left Entrance Stairs-Number of Steps: 2   Home  Layout: One level Home Equipment: Conservation officer, nature (2 wheels)      Prior Function Prior Level of Function : Independent/Modified Independent;Driving              Mobility Comments: Independent without DME, drives, retired from work ADLs Comments: stands to Doctor, general practice        Extremity/Trunk Assessment   Upper Extremity Assessment Upper Extremity Assessment: Overall WFL for tasks assessed    Lower Extremity Assessment Lower Extremity Assessment: Overall WFL for tasks assessed       Communication   Communication: HOH  Cognition Arousal/Alertness: Awake/alert Behavior During Therapy: WFL for tasks assessed/performed Overall Cognitive Status: Within Functional Limits for tasks assessed                                          General Comments      Exercises     Assessment/Plan    PT Assessment Patient does not need any further PT services  PT Problem List         PT Treatment Interventions      PT Goals (Current goals can be found in the Care Plan section)  Acute Rehab PT Goals PT Goal Formulation: All assessment and education complete, DC therapy    Frequency       Co-evaluation               AM-PAC PT "6 Clicks" Mobility  Outcome Measure Help needed turning from your back to your side while in a flat bed without using bedrails?: None Help needed moving from lying on your back to sitting on the side of a flat bed without using bedrails?: None Help needed moving to and from a bed to a chair (including a wheelchair)?: None Help needed standing up from a chair using your arms (e.g., wheelchair or bedside chair)?: None Help needed to walk in hospital room?: None Help needed climbing 3-5 steps with a railing? : None 6 Click Score: 24    End of Session   Activity Tolerance: Patient tolerated treatment well Patient left: in chair;with call bell/phone within reach Nurse Communication: Mobility status PT Visit Diagnosis: Other abnormalities of gait and mobility (R26.89)    Time: 7342-8768 PT Time Calculation (min) (ACUTE ONLY): 14 min   Charges:   PT Evaluation $PT Eval Low  Complexity: [place], PT, DPT Acute Rehabilitation Services  Personal: [facility] Office: [place] 07/23/2022, 9:10 AM

## 2022-08-08 ENCOUNTER — Ambulatory Visit: Payer: Medicare Other | Admitting: Family Medicine

## 2022-08-08 VITALS — BP 122/70 | HR 77 | Temp 97.3°F | Ht 66.0 in | Wt 150.4 lb

## 2022-08-08 DIAGNOSIS — K5720 Diverticulitis of large intestine with perforation and abscess without bleeding: Secondary | ICD-10-CM

## 2022-08-08 DIAGNOSIS — F17200 Nicotine dependence, unspecified, uncomplicated: Secondary | ICD-10-CM | POA: Diagnosis not present

## 2022-08-08 DIAGNOSIS — I4891 Unspecified atrial fibrillation: Secondary | ICD-10-CM | POA: Diagnosis not present

## 2022-08-08 DIAGNOSIS — R0689 Other abnormalities of breathing: Secondary | ICD-10-CM

## 2022-08-08 NOTE — Progress Notes (Signed)
Subjective:    Patient ID: Nicholas Huffman, male    DOB: 04/10/1946, 76 y.o.   MRN: [id number]  Patient was recently admitted to the hospital with perforated diverticulitis. I have copied the discharge summary below for reference:  Reason for Admission: 76 year old man with a history of hypertension hyperlipidemia and tobacco use states that he developed lower abdominal pain about 3 days ago.  It was pretty intense.  So on Sunday he tried to go to an urgent care but could not find one that was open he states.  He called his PCP when the pain did not get better and they advised him to go to urgent care.  He went to urgent care earlier today and after assessment he was immediately sent to the emergency room.  He states the pain eased up a little bit on Monday but returned.  He states he has had some mild nausea and vomiting.  No fever or chills.  Last bowel movement was Sunday.  He typically has a bowel movement maybe every other day.  No melena hematochezia.  He just had a colonoscopy by Dr. 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  He smokes 1 pack/day for many years.  He denies any chest pain, chest pressure, shortness of breath, dyspnea on exertion, claudication, TIAs or amaurosis fugax.  No prior abdominal surgery.  No alcohol use or drug use.  Lives with his wife.  Walks his dogs daily.   Procedures EXPLORATORY LAPAROTOMY, HARTMAN'S PROCEDURE (N/A) (Sigmoid colectomy with end colostomy), Dr. Redmond Pulling 07/17/22   Hospital Course:  POD 7, S/p  EXPLORATORY LAPAROTOMY, HARTMAN'S PROCEDURE 07/17/22 Dr. Redmond Pulling for Perforated sigmoid diverticulitis with feculent peritonitis The patient was admitted and underwent the above procedure.  His diet was able to be slowly advanced as he was waiting on his ileus to resolve.  As he began having bowel function, his colostomy started to function and was able to have a soft diet by the  time of discharge.  He had colostomy and wound care instructions prior to discharge along with sessions with his wife.  She is comfortable with dressing changes and colostomy care at home.  He had a surgical JP drain that was removed on day of discharge and remained serous.  He also completed 7 days of post op abx therapy.  He was AF with a stable WBC of 13K at the time of discharge.   Afib with RVR  Cardiology was consulted and placed on a dilt gtt initially.  He was then converted to oral dilt and metoprolol was added for further rate control.  He was also started on Eliquis BID as well   HTN  PRN IV meds, but home meds were held and stopped at discharge due to above change in medications for his a fib.   HLD  Resume home meds at DC   Tobacco use  He require O2 post op, but was able to wean off at this to RA at time of discharge.  He mobilized with therapies and no PT needs or O2 needs were found prior to discharge.   He was surgically and medically stable for DC home on 07/23/22 with appropriate follow up in place. Repeat creatinine - 08/08/22 Patient is here today for follow-up.  He denies any fever or chills.  He denies any chest pain.  He does have some right basilar crackles today on  exam.  His wife states that he had a lot of swelling in his legs after discharge from the hospital however that has resolved.  He denies any dysuria or urgency or frequency.  He denies any rash on his abdomen.  The ostomy site appears well-healed.  His operative site shows good granulation tissue and is healing well without any signs of secondary cellulitis.  His heart rate and his blood pressure are well controlled today however he does appear to still be in atrial fibrillation.  See 2 cm Past Medical History:  Diagnosis Date   Cancer ([place])    basal cell   Cataract    Fractured skull ([place])    as a child   Hyperlipidemia    Hypertension    Primary localized osteoarthritis of right knee    hx of- knee  has been replaced   Smoker    Wears glasses    Wears hearing aid in both ears    Past Surgical History:  Procedure Laterality Date   BASAL CELL CARCINOMA EXCISION  2016   face   CATARACT EXTRACTION W/ INTRAOCULAR LENS  IMPLANT, BILATERAL     COLONOSCOPY  2008, 2002   hx polyps/Dr. Fuller Plan   EYE SURGERY     retina detachment repair x 2   KNEE ARTHROSCOPY  April/2013   left   LAPAROTOMY N/A 07/16/2022   Procedure: EXPLORATORY LAPAROTOMY, HARTMAN'S PROCEDURE;  Surgeon: Greer Pickerel, MD;  Location: [facility];  Service: General;  Laterality: N/A;   TOTAL KNEE ARTHROPLASTY Right 03/26/2018   Procedure: RIGHT TOTAL KNEE ARTHROPLASTY;  Surgeon: Leandrew Koyanagi, MD;  Location: [facility];  Service: Orthopedics;  Laterality: Right;   Current Outpatient Medications on File Prior to Visit  Medication Sig Dispense Refill   acetaminophen (TYLENOL) 500 MG tablet Take 2 tablets (1,000 mg total) by mouth every 6 (six) hours as needed. 30 tablet 0   apixaban (ELIQUIS) 5 MG TABS tablet Take 1 tablet (5 mg total) by mouth 2 (two) times daily. 60 tablet 0   atorvastatin (LIPITOR) 40 MG tablet Take 1 tablet (40 mg total) by mouth daily. 90 tablet 3   diltiazem (CARDIZEM SR) 120 MG 12 hr capsule Take 2 capsules (240 mg total) by mouth daily. 60 capsule 0   metoprolol tartrate (LOPRESSOR) 50 MG tablet Take 1 tablet (50 mg total) by mouth 2 (two) times daily. 60 tablet 0   nicotine (NICODERM CQ - DOSED IN MG/24 HOURS) 21 mg/24hr patch Place 1 patch (21 mg total) onto the skin daily. (Patient not taking: Reported on 08/08/2022) 28 patch 0   traMADol (ULTRAM) 50 MG tablet Take 1 tablet (50 mg total) by mouth every 6 (six) hours as needed. (Patient not taking: Reported on 08/08/2022) 10 tablet 0   No current facility-administered medications on file prior to visit.   No Known Allergies Social History   Socioeconomic History   Marital status: Married    Spouse name: Not on file   Number of children: Not on file   Years of  education: Not on file   Highest education level: Not on file  Occupational History   Not on file  Tobacco Use   Smoking status: Every Day    Packs/day: 1.00    Types: Cigarettes   Smokeless tobacco: Never   Tobacco comments:    pt to call PCP for patch  Vaping Use   Vaping Use: Never used  Substance and Sexual Activity   Alcohol use: No  Drug use: No   Sexual activity: Not on file  Other Topics Concern   Not on file  Social History Narrative   Not on file   Social Determinants of Health   Financial Resource Strain: Not on file  Food Insecurity: Not on file  Transportation Needs: Not on file  Physical Activity: Not on file  Stress: Not on file  Social Connections: Not on file  Intimate Partner Violence: Not on file    Past Medical History:  Diagnosis Date   Cancer ([place])    basal cell   Cataract    Fractured skull ([place])    as a child   Hyperlipidemia    Hypertension    Primary localized osteoarthritis of right knee    hx of- knee has been replaced   Smoker    Wears glasses    Wears hearing aid in both ears    Past Surgical History:  Procedure Laterality Date   BASAL CELL CARCINOMA EXCISION  2016   face   CATARACT EXTRACTION W/ INTRAOCULAR LENS  IMPLANT, BILATERAL     COLONOSCOPY  2008, 2002   hx polyps/Dr. Fuller Plan   EYE SURGERY     retina detachment repair x 2   KNEE ARTHROSCOPY  April/2013   left   LAPAROTOMY N/A 07/16/2022   Procedure: EXPLORATORY LAPAROTOMY, HARTMAN'S PROCEDURE;  Surgeon: Greer Pickerel, MD;  Location: [facility];  Service: General;  Laterality: N/A;   TOTAL KNEE ARTHROPLASTY Right 03/26/2018   Procedure: RIGHT TOTAL KNEE ARTHROPLASTY;  Surgeon: Leandrew Koyanagi, MD;  Location: [facility];  Service: Orthopedics;  Laterality: Right;   Current Outpatient Medications on File Prior to Visit  Medication Sig Dispense Refill   acetaminophen (TYLENOL) 500 MG tablet Take 2 tablets (1,000 mg total) by mouth every 6 (six) hours as needed. 30 tablet 0   apixaban  (ELIQUIS) 5 MG TABS tablet Take 1 tablet (5 mg total) by mouth 2 (two) times daily. 60 tablet 0   atorvastatin (LIPITOR) 40 MG tablet Take 1 tablet (40 mg total) by mouth daily. 90 tablet 3   diltiazem (CARDIZEM SR) 120 MG 12 hr capsule Take 2 capsules (240 mg total) by mouth daily. 60 capsule 0   metoprolol tartrate (LOPRESSOR) 50 MG tablet Take 1 tablet (50 mg total) by mouth 2 (two) times daily. 60 tablet 0   nicotine (NICODERM CQ - DOSED IN MG/24 HOURS) 21 mg/24hr patch Place 1 patch (21 mg total) onto the skin daily. (Patient not taking: Reported on 08/08/2022) 28 patch 0   traMADol (ULTRAM) 50 MG tablet Take 1 tablet (50 mg total) by mouth every 6 (six) hours as needed. (Patient not taking: Reported on 08/08/2022) 10 tablet 0   No current facility-administered medications on file prior to visit.   No Known Allergies Social History   Socioeconomic History   Marital status: Married    Spouse name: Not on file   Number of children: Not on file   Years of education: Not on file   Highest education level: Not on file  Occupational History   Not on file  Tobacco Use   Smoking status: Every Day    Packs/day: 1.00    Types: Cigarettes   Smokeless tobacco: Never   Tobacco comments:    pt to call PCP for patch  Vaping Use   Vaping Use: Never used  Substance and Sexual Activity   Alcohol use: No   Drug use: No   Sexual activity: Not on file  Other Topics Concern   Not on file  Social History Narrative   Not on file   Social Determinants of Health   Financial Resource Strain: Not on file  Food Insecurity: Not on file  Transportation Needs: Not on file  Physical Activity: Not on file  Stress: Not on file  Social Connections: Not on file  Intimate Partner Violence: Not on file      Review of Systems  All other systems reviewed and are negative.      Objective:   Physical Exam Vitals reviewed.  Constitutional:      General: He is not in acute distress.    Appearance:  He is well-developed. He is not diaphoretic.  HENT:     Head: Normocephalic and atraumatic.     Right Ear: External ear normal.     Left Ear: External ear normal.     Nose: Nose normal.     Mouth/Throat:     Pharynx: No oropharyngeal exudate.  Eyes:     Conjunctiva/sclera: Conjunctivae normal.  Cardiovascular:     Rate and Rhythm: Normal rate. Rhythm irregular.     Heart sounds: Normal heart sounds. No murmur heard. Pulmonary:     Effort: Pulmonary effort is normal. No respiratory distress.     Breath sounds: Rales present. No wheezing.  Abdominal:     General: Bowel sounds are normal. There is no distension.     Palpations: Abdomen is soft.     Tenderness: There is no abdominal tenderness. There is no rebound.  Musculoskeletal:     Cervical back: Neck supple.     Right lower leg: No edema.     Left lower leg: No edema.  Lymphadenopathy:     Cervical: No cervical adenopathy.  Skin:    Findings: No erythema or rash.           Assessment & Plan:  Abnormal breath sounds - Plan: DG Chest 2 View  Perforation of sigmoid colon due to diverticulitis - Plan: CBC with Differential/Platelet, COMPLETE METABOLIC PANEL WITH GFR  Smoker  Atrial fibrillation, unspecified type ([place]) I congratulated the patient.  He quit smoking.  We discussed smoking cessation strategies including nicotine replacement with patches and how to taper off within.  His heart rate and his blood pressure well controlled on diltiazem and metoprolol.  He is appropriately anticoagulated on Eliquis.  Due to the abnormal breath sounds I will get a chest x-ray to ensure that there is no sign of a developing pneumonia.  I will check a CBC to evaluate for any postoperative anemia and check a CMP to check his electrolytes.  He already has a follow-up appointment with general surgery to discuss the revision of his colostomy in 2 weeks.  He also has an appointment to see cardiology to discuss the management of his atrial  fibrillation.

## 2022-08-09 ENCOUNTER — Ambulatory Visit
Admission: RE | Admit: 2022-08-09 | Discharge: 2022-08-09 | Disposition: A | Discharge status: Home / Self Care | Payer: Medicare Other | Source: Ambulatory Visit | Attending: Family Medicine | Admitting: Family Medicine

## 2022-08-09 DIAGNOSIS — R0689 Other abnormalities of breathing: Secondary | ICD-10-CM

## 2022-08-09 LAB — COMPLETE METABOLIC PANEL WITH GFR
AG Ratio: 1.7 (calc) (ref 1.0–2.5)
ALT: 27 U/L (ref 9–46)
AST: 17 U/L (ref 10–35)
Albumin: 3.8 g/dL (ref 3.6–5.1)
Alkaline phosphatase (APISO): 94 U/L (ref 35–144)
BUN: 24 mg/dL (ref 7–25)
CO2: 27 mmol/L (ref 20–32)
Calcium: 9.2 mg/dL (ref 8.6–10.3)
Chloride: 104 mmol/L (ref 98–110)
Creat: 1.21 mg/dL (ref 0.70–1.28)
Globulin: 2.3 g/dL (calc) (ref 1.9–3.7)
Glucose, Bld: 111 mg/dL — ABNORMAL HIGH (ref 65–99)
Potassium: 4.2 mmol/L (ref 3.5–5.3)
Sodium: 138 mmol/L (ref 135–146)
Total Bilirubin: 0.5 mg/dL (ref 0.2–1.2)
Total Protein: 6.1 g/dL (ref 6.1–8.1)
eGFR: 62 mL/min/{1.73_m2} (ref >=60)

## 2022-08-09 LAB — CBC WITH DIFFERENTIAL/PLATELET
Absolute Monocytes: 561 cells/uL (ref 200–950)
Basophils Absolute: 68 cells/uL (ref 0–200)
Basophils Relative: 0.8 %
Eosinophils Absolute: 340 cells/uL (ref 15–500)
Eosinophils Relative: 4 %
HCT: 41.2 % (ref 38.5–50.0)
Hemoglobin: 13.9 g/dL (ref 13.2–17.1)
Lymphs Abs: 1590 cells/uL (ref 850–3900)
MCH: 30.8 pg (ref 27.0–33.0)
MCHC: 33.7 g/dL (ref 32.0–36.0)
MCV: 91.4 fL (ref 80.0–100.0)
MPV: 9.9 fL (ref 7.5–12.5)
Monocytes Relative: 6.6 %
Neutro Abs: 5942 cells/uL (ref 1500–7800)
Neutrophils Relative %: 69.9 %
Platelets: 350 10*3/uL (ref 140–400)
RBC: 4.51 10*6/uL (ref 4.20–5.80)
RDW: 13.9 % (ref 11.0–15.0)
Total Lymphocyte: 18.7 %
WBC: 8.5 10*3/uL (ref 3.8–10.8)

## 2022-08-19 NOTE — Progress Notes (Unsigned)
Cardiology Office Note:    Date:  08/20/2022   ID:  Nicholas Huffman, DOB Oct 10, 1946, MRN [id number]  PCP:  Susy Frizzle, MD   [facility] Cardiologist:  Kirk Ruths, MD     Referring MD: Susy Frizzle, MD   CC: Here for hospital follow-up  History of Present Illness:    Nicholas Huffman is a 76 y.o. male with a hx of the following  A-fib (on Eliquis) HTN HLD Smoker  Presented to the hospital with abdominal pain on July 17, 2022.  Most previous colonoscopy done in mid July of this year showed evidence of some diverticular disease along with multiple polyps and was placed on 3-year follow-up.  Smokes 1 pack/day times many years.  Underwent exploratory laparotomy, Hartman's procedure on July 17, 2022 with Dr. Redmond Pulling for perforated sigmoid diverticulitis with feculent peritonitis.  Had a surgical JP drain that was removed on day of discharge and remains serious.  Completed 7 days of postop antibiotic therapy.  He did go into A-fib with RVR during hospitalization.  Cardiology was consulted and was placed on a Cardizem drip initially, converted to oral Cardizem and Metoprolol that was added for rate control.  Was started on Eliquis 5 mg twice daily.  He was on O2 postop but was able to be weaned off to room air.  Echo revealed LVEF 65 to 70%.  Presents today with his wife for hospital follow-up for evaluation of recent A-fib, HTN, HLD, and tobacco abuse.  Overall he is doing well from a cardiac perspective.  Denies any palpitations or fast heart rates since hospital discharge.  Denies any chest pain, shortness of breath, syncope, presyncope, dizziness, abnormal blood pressure readings, orthopnea, PND, any acute bleeding, or claudication.  BP well controlled at home.  States atrial fibrillation is a new diagnosis for him and would like to discuss options.  He will be following up with GI for hospital follow-up this Friday and would like to defer any cardiac procedures  until he hears back from his GI doctor's recommendations.  Denies any other questions or concerns today.   Past Medical History:  Diagnosis Date   Cancer ([place])    basal cell   Cataract    Fractured skull ([place])    as a child   Hyperlipidemia    Hypertension    Primary localized osteoarthritis of right knee    hx of- knee has been replaced   Smoker    Wears glasses    Wears hearing aid in both ears     Past Surgical History:  Procedure Laterality Date   BASAL CELL CARCINOMA EXCISION  2016   face   CATARACT EXTRACTION W/ INTRAOCULAR LENS  IMPLANT, BILATERAL     COLONOSCOPY  2008, 2002   hx polyps/Dr. Fuller Plan   EYE SURGERY     retina detachment repair x 2   KNEE ARTHROSCOPY  April/2013   left   LAPAROTOMY N/A 07/16/2022   Procedure: EXPLORATORY LAPAROTOMY, HARTMAN'S PROCEDURE;  Surgeon: Greer Pickerel, MD;  Location: [facility];  Service: General;  Laterality: N/A;   TOTAL KNEE ARTHROPLASTY Right 03/26/2018   Procedure: RIGHT TOTAL KNEE ARTHROPLASTY;  Surgeon: Leandrew Koyanagi, MD;  Location: [facility];  Service: Orthopedics;  Laterality: Right;    Current Medications: Current Meds  Medication Sig   atorvastatin (LIPITOR) 40 MG tablet Take 1 tablet (40 mg total) by mouth daily.   nicotine (NICODERM CQ - DOSED IN MG/24 HOURS) 21 mg/24hr patch  Place 1 patch (21 mg total) onto the skin daily.   apixaban (ELIQUIS) 5 MG TABS tablet Take 1 tablet (5 mg total) by mouth 2 (two) times daily.   diltiazem (CARDIZEM SR) 120 MG 12 hr capsule Take 2 capsules (240 mg total) by mouth daily.   metoprolol tartrate (LOPRESSOR) 50 MG tablet Take 1 tablet (50 mg total) by mouth 2 (two) times daily.     Allergies:   Patient has no known allergies.   Social History   Socioeconomic History   Marital status: Married    Spouse name: Not on file   Number of children: Not on file   Years of education: Not on file   Highest education level: Not on file  Occupational History   Not on file  Tobacco Use    Smoking status: Former    Packs/day: 1.00    Types: Cigarettes   Smokeless tobacco: Never   Tobacco comments:    pt to call PCP for patch  Vaping Use   Vaping Use: Never used  Substance and Sexual Activity   Alcohol use: No   Drug use: No   Sexual activity: Not on file  Other Topics Concern   Not on file  Social History Narrative   Not on file   Social Determinants of Health   Financial Resource Strain: Not on file  Food Insecurity: Not on file  Transportation Needs: Not on file  Physical Activity: Not on file  Stress: Not on file  Social Connections: Not on file     Family History: The patient's family history includes Breast cancer in his mother; Cancer in his father and mother. There is no history of Colon cancer, Esophageal cancer, Rectal cancer, or Stomach cancer.  ROS:   Review of Systems  Constitutional: Negative.   HENT: Negative.    Eyes: Negative.   Respiratory: Negative.    Cardiovascular: Negative.   Gastrointestinal: Negative.   Musculoskeletal: Negative.   Skin: Negative.   Neurological: Negative.   Endo/Heme/Allergies: Negative.   Psychiatric/Behavioral: Negative.      Please see the history of present illness.    All other systems reviewed and are negative.  EKGs/Labs/Other Studies Reviewed:    The following studies were reviewed today:   EKG:  EKG is ordered today.  The ekg ordered today demonstrates atrial fibrillation, 61 bpm with rate controlled, otherwise no acute abnormality.  Recent Labs: 07/21/2022: Magnesium 1.9; TSH 4.351 08/08/2022: ALT 27; BUN 24; Creat 1.21; Hemoglobin 13.9; Platelets 350; Potassium 4.2; Sodium 138  Recent Lipid Panel    Component Value Date/Time   CHOL 111 04/12/2022 0808   TRIG 87 04/12/2022 0808   HDL 47 04/12/2022 0808   CHOLHDL 2.4 04/12/2022 0808   VLDL 24 02/13/2017 0922   LDLCALC 47 04/12/2022 0808     Risk Assessment/Calculations:    CHA2DS2-VASc Score = 3  This indicates a 3.2% annual risk  of stroke. The patient's score is based upon: CHF History: 0 HTN History: 1 Diabetes History: 0 Stroke History: 0 Vascular Disease History: 0 Age Score: 2 Gender Score: 0   HYPERTENSION CONTROL Vitals:   08/20/22 1600 08/20/22 1605  BP: (!) 154/86 (!) 141/62      Physical Exam:    VS:  BP (!) 141/62 (BP Location: Left Arm, Patient Position: Sitting, Cuff Size: Normal)   Pulse 68   Ht '5\' 6"'$  (1.676 m)   Wt 149 lb 6.4 oz (67.8 kg)   SpO2 96%   BMI  24.11 kg/m     Wt Readings from Last 3 Encounters:  08/20/22 149 lb 6.4 oz (67.8 kg)  08/08/22 150 lb 6.4 oz (68.2 kg)  07/17/22 159 lb 9.8 oz (72.4 kg)    Vitals:   08/20/22 1600 08/20/22 1605  BP: (!) 154/86 (!) 141/62  Pulse: 68   SpO2: 96%     GEN: Well nourished, well developed in no acute distress HEENT: Normal NECK: No JVD; No carotid bruits CARDIAC: Irregular rhythm and regular rate, S1-S2, 61 bpm, no murmurs, rubs, gallops; 2+ peripheral pulses throughout, strong and equal bilaterally RESPIRATORY:  Clear and diminished to auscultation without rales, wheezing or rhonchi  MUSCULOSKELETAL:  No edema; No deformity  SKIN: Warm and dry NEUROLOGIC:  Alert and oriented x 3 PSYCHIATRIC:  Normal affect   ASSESSMENT:    1. Atrial fibrillation, unspecified type ([place])   2. Current use of long term anticoagulation   3. Hypertension, unspecified type   4. Hyperlipidemia, unspecified hyperlipidemia type   5. Tobacco abuse    PLAN:    In order of problems listed above:  Atrial fibrillation - recent, stable Long term anticoagulation - chronic, stable New onset atrial fibrillation from last hospitalization.  Twelve-lead EKG reveals rate controlled atrial fibrillation, 61 bpm.  He is compliant with taking Eliquis 5 mg twice daily, has not skipped any doses.  On appropriate dosing.  Continue Cardizem 240 mg total by mouth daily, metoprolol tartrate 50 mg twice daily, and Eliquis 5 mg twice daily.  Would like to defer and  discuss options to treat A-fib at another follow-up visit with me due to GI appointment this Friday.  We will refill his cardiac medication today.    3.  Hypertension-chronic, stable Initial BP today 154/86, repeat 141/62.  Does admit to whitecoat hypertension.  BP well controlled at home. Discussed to monitor BP at home at least 2 hours after medications and sitting for 5-10 minutes.  He will monitor this at home and let me know if systolic blood pressure greater than 130 consistently.  Continue current medication regimen.  4.  Hyperlipidemia-chronic, stable LDL was 47 in May 2023.  This is being managed by PCP.  Continue Lipitor 40 mg daily.  5.  Tobacco abuse-chronic, resolved Says he has stopped smoking for good.  I congratulated him today.  Continue nicotine patch.  6. Disposition: Follow-up with me or Laurann Montana, NP in 2 weeks or sooner if needed.   Medication Adjustments/Labs and Tests Ordered: Current medicines are reviewed at length with the patient today.  Concerns regarding medicines are outlined above.  Orders Placed This Encounter  Procedures   EKG 12-Lead   Meds ordered this encounter  Medications   apixaban (ELIQUIS) 5 MG TABS tablet    Sig: Take 1 tablet (5 mg total) by mouth 2 (two) times daily.    Dispense:  180 tablet    Refill:  2   diltiazem (CARDIZEM SR) 120 MG 12 hr capsule    Sig: Take 2 capsules (240 mg total) by mouth daily.    Dispense:  180 capsule    Refill:  2   metoprolol tartrate (LOPRESSOR) 50 MG tablet    Sig: Take 1 tablet (50 mg total) by mouth 2 (two) times daily.    Dispense:  180 tablet    Refill:  2    Patient Instructions  Medication Instructions:  Not needed *If you need a refill on your cardiac medications before your next appointment, please call your pharmacy*  Lab Work:  Not needed    Testing/Procedures: Not needed   Follow-Up: At [facility], you and your health needs are our priority.  As part of our  continuing mission to provide you with exceptional heart care, we have created designated Provider Care Teams.  These Care Teams include your primary Cardiologist (physician) and Advanced Practice Providers (APPs -  Physician Assistants and Nurse Practitioners) who all work together to provide you with the care you need, when you need it.  We recommend signing up for the patient portal called "MyChart".  Sign up information is provided on this After Visit Summary.  MyChart is used to connect with patients for Virtual Visits (Telemedicine).  Patients are able to view lab/test results, encounter notes, upcoming appointments, etc.  Non-urgent messages can be sent to your provider as well.   To learn more about what you can do with MyChart, go to [email or web address].    Your next appointment:   2 week(s)  The format for your next appointment:   In Person  Provider:   Laurann Montana, NPand Finis Bud NP      Important Information About Sugar         Signed, Finis Bud, NP  08/20/2022 7:47 PM    [place]

## 2022-08-20 ENCOUNTER — Ambulatory Visit: Payer: Medicare Other | Attending: General Practice | Admitting: Nurse Practitioner

## 2022-08-20 ENCOUNTER — Encounter: Payer: Self-pay | Admitting: General Practice

## 2022-08-20 VITALS — BP 141/62 | HR 68 | Ht 66.0 in | Wt 149.4 lb

## 2022-08-20 DIAGNOSIS — Z7901 Long term (current) use of anticoagulants: Secondary | ICD-10-CM | POA: Diagnosis not present

## 2022-08-20 DIAGNOSIS — E785 Hyperlipidemia, unspecified: Secondary | ICD-10-CM

## 2022-08-20 DIAGNOSIS — I4891 Unspecified atrial fibrillation: Secondary | ICD-10-CM

## 2022-08-20 DIAGNOSIS — I10 Essential (primary) hypertension: Secondary | ICD-10-CM | POA: Diagnosis not present

## 2022-08-20 DIAGNOSIS — Z720 Tobacco use: Secondary | ICD-10-CM

## 2022-08-20 MED ORDER — METOPROLOL TARTRATE 50 MG PO TABS: 50.0000 mg | ORAL_TABLET | Freq: Two times a day (BID) | ORAL | 2 refills | Status: DC

## 2022-08-20 MED ORDER — DILTIAZEM HCL ER 120 MG PO CP12: 240.0000 mg | ORAL_CAPSULE | Freq: Every day | ORAL | 2 refills | Status: DC

## 2022-08-20 MED ORDER — APIXABAN 5 MG PO TABS: 5.0000 mg | ORAL_TABLET | Freq: Two times a day (BID) | ORAL | 2 refills | Status: DC

## 2022-08-20 NOTE — Patient Instructions (Signed)
Medication Instructions:  Not needed *If you need a refill on your cardiac medications before your next appointment, please call your pharmacy*   Lab Work:  Not needed    Testing/Procedures: Not needed   Follow-Up: At [facility], you and your health needs are our priority.  As part of our continuing mission to provide you with exceptional heart care, we have created designated Provider Care Teams.  These Care Teams include your primary Cardiologist (physician) and Advanced Practice Providers (APPs -  Physician Assistants and Nurse Practitioners) who all work together to provide you with the care you need, when you need it.  We recommend signing up for the patient portal called "MyChart".  Sign up information is provided on this After Visit Summary.  MyChart is used to connect with patients for Virtual Visits (Telemedicine).  Patients are able to view lab/test results, encounter notes, upcoming appointments, etc.  Non-urgent messages can be sent to your provider as well.   To learn more about what you can do with MyChart, go to [email or web address].    Your next appointment:   2 week(s)  The format for your next appointment:   In Person  Provider:   Laurann Montana, NPand Finis Bud NP      Important Information About Sugar

## 2022-09-01 NOTE — H&P (View-Only) (Signed)
Cardiology Office Note:    Date:  09/02/2022   ID:  Nicholas Huffman, DOB [birth year], MRN [id number]  PCP:  Susy Frizzle, MD   [place] Providers Cardiologist:  Kirk Ruths, MD     Referring MD: Susy Frizzle, MD   CC: Here for follow-up for A-fib  History of Present Illness:    Nicholas Huffman is a 76 y.o. male with a hx of the following:   A-fib (on Eliquis) HTN HLD Former smoker Aortic dilatation  Presented to the hospital with abdominal pain on July 17, 2022.  Most previous colonoscopy done in mid July of this year showed evidence of some diverticular disease along with multiple polyps and was placed on 3-year follow-up.  Smokes 1 pack/day times many years.  Underwent exploratory laparotomy, Hartman's procedure on July 17, 2022 with Dr. Redmond Pulling for perforated sigmoid diverticulitis with feculent peritonitis.  Had a surgical JP drain that was removed on day of discharge and remains serious.  Completed 7 days of postop antibiotic therapy.  Went into A-fib with RVR during hospitalization.  Cardiology was consulted and was placed on a Cardizem drip initially, converted to oral Cardizem and Metoprolol that was added for rate control.  Was started on Eliquis 5 mg twice daily.  He was on O2 postop but was able to be weaned off to room air.  Echo revealed LVEF 65 to 70%.   I last saw this patient on 08/20/2022 for hospital follow-up for evaluation of A-fib, HTN, HLD, and tobacco abuse. Overall was doing well from a cardiac perspective. BP was well controlled. Wanted to discuss options for A-fib, but requested to defer any cardiac procedures until he heard back from his GI doctor's recommendations on 08/23/22. BP elevated in office that day, admitted to [facility]. Officially quit smoking.   Today he presents for follow-up to discuss A-fib options.  He tells me that GI doctor will be seeing him back in the office until December and it would likely be not until January before the  procedure can be done.  Overall he is doing well from cardiac perspective.  Denies any chest pain, shortness of breath, palpitations, cardia, syncope, presyncope, dizziness, lightheadedness, orthopnea, PND, claudication, swelling, or any acute bleeding.  He is compliant and has not skipped any doses of Eliquis.  Denies any other questions or concerns today.  Past Medical History:  Diagnosis Date   Cancer ([place])    basal cell   Cataract    Fractured skull ([place])    as a child   Hyperlipidemia    Hypertension    Primary localized osteoarthritis of right knee    hx of- knee has been replaced   Smoker    Wears glasses    Wears hearing aid in both ears     Past Surgical History:  Procedure Laterality Date   BASAL CELL CARCINOMA EXCISION  2016   face   CATARACT EXTRACTION W/ INTRAOCULAR LENS  IMPLANT, BILATERAL     COLONOSCOPY  2008, 2002   hx polyps/Dr. Fuller Plan   EYE SURGERY     retina detachment repair x 2   KNEE ARTHROSCOPY  April/2013   left   LAPAROTOMY N/A 07/16/2022   Procedure: EXPLORATORY LAPAROTOMY, HARTMAN'S PROCEDURE;  Surgeon: Greer Pickerel, MD;  Location: [place];  Service: General;  Laterality: N/A;   TOTAL KNEE ARTHROPLASTY Right 03/26/2018   Procedure: RIGHT TOTAL KNEE ARTHROPLASTY;  Surgeon: Leandrew Koyanagi, MD;  Location: [place];  Service: Orthopedics;  Laterality: Right;    Current Medications: Current Meds  Medication Sig   apixaban (ELIQUIS) 5 MG TABS tablet Take 1 tablet (5 mg total) by mouth 2 (two) times daily.   atorvastatin (LIPITOR) 40 MG tablet Take 1 tablet (40 mg total) by mouth daily.   diltiazem (CARDIZEM SR) 120 MG 12 hr capsule Take 2 capsules (240 mg total) by mouth daily.   metoprolol tartrate (LOPRESSOR) 50 MG tablet Take 1 tablet (50 mg total) by mouth 2 (two) times daily.   nicotine (NICODERM CQ - DOSED IN MG/24 HOURS) 14 mg/24hr patch Place 14 mg onto the skin daily.     Allergies:   Patient has no known allergies.   Social History    Socioeconomic History   Marital status: Married    Spouse name: Not on file   Number of children: Not on file   Years of education: Not on file   Highest education level: Not on file  Occupational History   Not on file  Tobacco Use   Smoking status: Former    Packs/day: 1.00    Types: Cigarettes   Smokeless tobacco: Never   Tobacco comments:    pt to call PCP for patch  Vaping Use   Vaping Use: Never used  Substance and Sexual Activity   Alcohol use: No   Drug use: No   Sexual activity: Not on file  Other Topics Concern   Not on file  Social History Narrative   Not on file   Social Determinants of Health   Financial Resource Strain: Not on file  Food Insecurity: Not on file  Transportation Needs: Not on file  Physical Activity: Not on file  Stress: Not on file  Social Connections: Not on file     Family History: The patient's family history includes Breast cancer in his mother; Cancer in his father and mother. There is no history of Colon cancer, Esophageal cancer, Rectal cancer, or Stomach cancer.  ROS:   Review of Systems  Constitutional: Negative.   HENT: Negative.    Eyes: Negative.   Respiratory: Negative.    Cardiovascular: Negative.   Gastrointestinal: Negative.   Genitourinary: Negative.   Musculoskeletal: Negative.   Skin: Negative.   Neurological: Negative.   Endo/Heme/Allergies: Negative.   Psychiatric/Behavioral: Negative.      Please see the history of present illness.    All other systems reviewed and are negative.  EKGs/Labs/Other Studies Reviewed:    The following studies were reviewed today:   EKG:  EKG is ordered today.  The ekg ordered today demonstrates atrial fibrillation, 63 bpm, otherwise no acute changes.  2D Echo on 07/19/2022:  1. Left ventricular ejection fraction, by estimation, is 65 to 70%. The  left ventricle has normal function. The left ventricle has no regional  wall motion abnormalities. Left ventricular  diastolic function could not  be evaluated.   2. Right ventricular systolic function is normal. The right ventricular  size is normal. Tricuspid regurgitation signal is inadequate for assessing  PA pressure.   3. The mitral valve is normal in structure. No evidence of mitral valve  regurgitation. No evidence of mitral stenosis.   4. The aortic valve was not well visualized. Aortic valve regurgitation  is not visualized. No aortic stenosis is present.   5. Aortic dilatation noted. There is mild dilatation of the aortic root,  measuring 38 mm.    AAA Duplex on 03/30/2020: Abdominal Aorta: No evidence of an abdominal aortic aneurysm was  visualized. The largest aortic measurement is 2.3 cm.   Recent Labs: 07/21/2022: Magnesium 1.9; TSH 4.351 08/08/2022: ALT 27; BUN 24; Creat 1.21; Hemoglobin 13.9; Platelets 350; Potassium 4.2; Sodium 138  Recent Lipid Panel    Component Value Date/Time   CHOL 111 04/12/2022 0808   TRIG 87 04/12/2022 0808   HDL 47 04/12/2022 0808   CHOLHDL 2.4 04/12/2022 0808   VLDL 24 02/13/2017 0922   LDLCALC 47 04/12/2022 0808     Risk Assessment/Calculations:    CHA2DS2-VASc Score = 3  This indicates a 3.2% annual risk of stroke. The patient's score is based upon: CHF History: 0 HTN History: 1 Diabetes History: 0 Stroke History: 0 Vascular Disease History: 0 Age Score: 2 Gender Score: 0     Physical Exam:    VS:  BP 130/82   Pulse 60   Ht '5\' 6"'$  (1.676 m)   Wt 153 lb (69.4 kg)   BMI 24.69 kg/m     Wt Readings from Last 3 Encounters:  09/02/22 153 lb (69.4 kg)  08/20/22 149 lb 6.4 oz (67.8 kg)  08/08/22 150 lb 6.4 oz (68.2 kg)     GEN: Well nourished, well developed in no acute distress HEENT: Normal NECK: No JVD; No carotid bruits LYMPHATICS: No lymphadenopathy CARDIAC: S1/S2, irregular rhythm and regular rate, no murmurs, rubs, gallops; 2+ peripheral pulses throughout, strong and equal bilaterally RESPIRATORY:  Clear and diminished to  auscultation without rales, wheezing or rhonchi  MUSCULOSKELETAL:  No edema; No deformity  SKIN: Warm and dry NEUROLOGIC:  Alert and oriented x 3 PSYCHIATRIC:  Normal affect   ASSESSMENT:    1. Atrial fibrillation, unspecified type ([place])   2. Hypertension, unspecified type   3. Hyperlipidemia, unspecified hyperlipidemia type   4. Aortic dilatation (HCC)    PLAN:    In order of problems listed above:  Atrial fibrillation, long term anticoagulation Denies any tachycardia or palpitations. EKG today shows A-fib with rate control, 63 bpm.  Discussed options for atrial fibrillation including DCCV, including risk and benefits outlined below, and he is agreeable to proceed.  He is compliant with Eliquis and has not skipped any doses for over a month.  Continue Eliquis 5 mg twice daily, metoprolol tartrate 50 mg twice daily, and diltiazem 240 mg daily.  Will obtain CBC and BMET today per protocol.  Orders are signed and held for upcoming procedure. He is aware that he will continue Eliquis 5 mg twice daily for at least 4 weeks uninterrupted post DCCV.  Shared Decision Making/Informed Consent The risks (stroke, cardiac arrhythmias rarely resulting in the need for a temporary or permanent pacemaker, skin irritation or burns and complications associated with conscious sedation including aspiration, arrhythmia, respiratory failure and death), benefits (restoration of normal sinus rhythm) and alternatives of a direct current cardioversion were explained in detail to Mr. Samons and he agrees to proceed.   HTN BP today 130/82.  BP well controlled at home.  Continue metoprolol tartrate 50 mg twice daily as scheduled.  We will obtain BMET as mentioned above. Discussed to monitor BP at home at least 2 hours after medications and sitting for 5-10 minutes.  HLD Last LDL 47 in May 2023.  Continue Lipitor 40 mg daily.  PCP to manage.  Aortic dilatation Aortic dilatation noted on echocardiogram on July 19, 2022.  Mild dilatation of the aortic root, measuring 30 mm.  Recommend to update 2D echocardiogram in the following year.     5.  Disposition: Follow-up  with me or Laurann Montana, NP in 1 to 2 weeks post DCCV or sooner if anything changes. Medication Adjustments/Labs and Tests Ordered: Current medicines are reviewed at length with the patient today.  Concerns regarding medicines are outlined above.  Orders Placed This Encounter  Procedures   CBC   Basic metabolic panel   No orders of the defined types were placed in this encounter.   Patient Instructions  Medication Instructions:  Your Physician recommend you continue on your current medication as directed.    *If you need a refill on your cardiac medications before your next appointment, please call your pharmacy*   Lab Work: Your provider has recommended lab work today (CBC, BMP0. Please have this collected at [facility]. The lab is open 8:00 am - 4:30 pm. Please avoid 12:00p - 1:00p for lunch hour. You do not need an appointment. Please go to [address] [place], [place] [address]. This is in the Primary Care office on the 3rd floor, let them know you are there for blood work and they will direct you to the lab.  If you have labs (blood work) drawn today and your tests are completely normal, you will receive your results only by: [facility] (if you have MyChart) OR A paper copy in the mail If you have any lab test that is abnormal or we need to change your treatment, we will call you to review the results.   Testing/Procedures: Cardioversion [place]   Follow-Up: At [facility], you and your health needs are our priority.  As part of our continuing mission to provide you with exceptional heart care, we have created designated Provider Care Teams.  These Care Teams include your primary Cardiologist (physician) and Advanced Practice Providers (APPs -  Physician Assistants  and Nurse Practitioners) who all work together to provide you with the care you need, when you need it.  We recommend signing up for the patient portal called "MyChart".  Sign up information is provided on this After Visit Summary.  MyChart is used to connect with patients for Virtual Visits (Telemedicine).  Patients are able to view lab/test results, encounter notes, upcoming appointments, etc.  Non-urgent messages can be sent to your provider as well.   To learn more about what you can do with MyChart, go to [email or web address].    Your next appointment:   09/24/22 @ 9:15 am  The format for your next appointment:   In Person  Provider:   Finis Bud, NP   You are scheduled for a Cardioversion on Thursday 09/12/22 with Dr. Marlou Porch.  Please arrive at the [facility] (Main Entrance A) at [facility]: [address] [place], [place] [address] at 8:30 am  DIET: Nothing to eat or drink after midnight except a sip of water with medications (see medication instructions below)  FYI: For your safety, and to allow [place] to monitor your vital signs accurately during the surgery/procedure we request that   if you have artificial nails, gel coating, SNS etc. Please have those removed prior to your surgery/procedure. Not having the nail coverings /polish removed may result in cancellation or delay of your surgery/procedure.   Medication Instructions: Hold Nicotine Patch day of procedure  Continue your anticoagulant: Eliquis You will need to continue your anticoagulant after your procedure until you  are told by your  Provider that it is safe to stop   Labs: Today (CBC, BMP)  You must have a responsible  person to drive you home and stay in the waiting area during your procedure. Failure to do so could result in cancellation.  Bring your insurance cards.  *Special Note: Every effort is made to have your procedure done on time. Occasionally there are emergencies that occur at the  hospital that may cause delays. Please be patient if a delay does occur.            Signed, Finis Bud, NP  09/02/2022 7:09 PM    [place]

## 2022-09-01 NOTE — Progress Notes (Signed)
Cardiology Office Note:    Date:  09/02/2022   ID:  Nicholas Huffman, DOB 02-12-1946, MRN [id number]  PCP:  Susy Frizzle, MD   [facility] Cardiologist:  Kirk Ruths, MD     Referring MD: Susy Frizzle, MD   CC: Here for follow-up for A-fib  History of Present Illness:    Nicholas Huffman is a 76 y.o. male with a hx of the following:   A-fib (on Eliquis) HTN HLD Former smoker Aortic dilatation  Presented to the hospital with abdominal pain on July 17, 2022.  Most previous colonoscopy done in mid July of this year showed evidence of some diverticular disease along with multiple polyps and was placed on 3-year follow-up.  Smokes 1 pack/day times many years.  Underwent exploratory laparotomy, Hartman's procedure on July 17, 2022 with Dr. Redmond Pulling for perforated sigmoid diverticulitis with feculent peritonitis.  Had a surgical JP drain that was removed on day of discharge and remains serious.  Completed 7 days of postop antibiotic therapy.  Went into A-fib with RVR during hospitalization.  Cardiology was consulted and was placed on a Cardizem drip initially, converted to oral Cardizem and Metoprolol that was added for rate control.  Was started on Eliquis 5 mg twice daily.  He was on O2 postop but was able to be weaned off to room air.  Echo revealed LVEF 65 to 70%.   I last saw this patient on 08/20/2022 for hospital follow-up for evaluation of A-fib, HTN, HLD, and tobacco abuse. Overall was doing well from a cardiac perspective. BP was well controlled. Wanted to discuss options for A-fib, but requested to defer any cardiac procedures until he heard back from his GI doctor's recommendations on 08/23/22. BP elevated in office that day, admitted to [facility]. Officially quit smoking.   Today he presents for follow-up to discuss A-fib options.  He tells me that GI doctor will be seeing him back in the office until December and it would likely be not until January before the  procedure can be done.  Overall he is doing well from cardiac perspective.  Denies any chest pain, shortness of breath, palpitations, cardia, syncope, presyncope, dizziness, lightheadedness, orthopnea, PND, claudication, swelling, or any acute bleeding.  He is compliant and has not skipped any doses of Eliquis.  Denies any other questions or concerns today.  Past Medical History:  Diagnosis Date   Cancer ([place])    basal cell   Cataract    Fractured skull ([place])    as a child   Hyperlipidemia    Hypertension    Primary localized osteoarthritis of right knee    hx of- knee has been replaced   Smoker    Wears glasses    Wears hearing aid in both ears     Past Surgical History:  Procedure Laterality Date   BASAL CELL CARCINOMA EXCISION  2016   face   CATARACT EXTRACTION W/ INTRAOCULAR LENS  IMPLANT, BILATERAL     COLONOSCOPY  2008, 2002   hx polyps/Dr. Fuller Plan   EYE SURGERY     retina detachment repair x 2   KNEE ARTHROSCOPY  April/2013   left   LAPAROTOMY N/A 07/16/2022   Procedure: EXPLORATORY LAPAROTOMY, HARTMAN'S PROCEDURE;  Surgeon: Greer Pickerel, MD;  Location: [facility];  Service: General;  Laterality: N/A;   TOTAL KNEE ARTHROPLASTY Right 03/26/2018   Procedure: RIGHT TOTAL KNEE ARTHROPLASTY;  Surgeon: Leandrew Koyanagi, MD;  Location: [facility];  Service: Orthopedics;  Laterality: Right;    Current Medications: Current Meds  Medication Sig   apixaban (ELIQUIS) 5 MG TABS tablet Take 1 tablet (5 mg total) by mouth 2 (two) times daily.   atorvastatin (LIPITOR) 40 MG tablet Take 1 tablet (40 mg total) by mouth daily.   diltiazem (CARDIZEM SR) 120 MG 12 hr capsule Take 2 capsules (240 mg total) by mouth daily.   metoprolol tartrate (LOPRESSOR) 50 MG tablet Take 1 tablet (50 mg total) by mouth 2 (two) times daily.   nicotine (NICODERM CQ - DOSED IN MG/24 HOURS) 14 mg/24hr patch Place 14 mg onto the skin daily.     Allergies:   Patient has no known allergies.   Social History    Socioeconomic History   Marital status: Married    Spouse name: Not on file   Number of children: Not on file   Years of education: Not on file   Highest education level: Not on file  Occupational History   Not on file  Tobacco Use   Smoking status: Former    Packs/day: 1.00    Types: Cigarettes   Smokeless tobacco: Never   Tobacco comments:    pt to call PCP for patch  Vaping Use   Vaping Use: Never used  Substance and Sexual Activity   Alcohol use: No   Drug use: No   Sexual activity: Not on file  Other Topics Concern   Not on file  Social History Narrative   Not on file   Social Determinants of Health   Financial Resource Strain: Not on file  Food Insecurity: Not on file  Transportation Needs: Not on file  Physical Activity: Not on file  Stress: Not on file  Social Connections: Not on file     Family History: The patient's family history includes Breast cancer in his mother; Cancer in his father and mother. There is no history of Colon cancer, Esophageal cancer, Rectal cancer, or Stomach cancer.  ROS:   Review of Systems  Constitutional: Negative.   HENT: Negative.    Eyes: Negative.   Respiratory: Negative.    Cardiovascular: Negative.   Gastrointestinal: Negative.   Genitourinary: Negative.   Musculoskeletal: Negative.   Skin: Negative.   Neurological: Negative.   Endo/Heme/Allergies: Negative.   Psychiatric/Behavioral: Negative.      Please see the history of present illness.    All other systems reviewed and are negative.  EKGs/Labs/Other Studies Reviewed:    The following studies were reviewed today:   EKG:  EKG is ordered today.  The ekg ordered today demonstrates atrial fibrillation, 63 bpm, otherwise no acute changes.  2D Echo on 07/19/2022:  1. Left ventricular ejection fraction, by estimation, is 65 to 70%. The  left ventricle has normal function. The left ventricle has no regional  wall motion abnormalities. Left ventricular  diastolic function could not  be evaluated.   2. Right ventricular systolic function is normal. The right ventricular  size is normal. Tricuspid regurgitation signal is inadequate for assessing  PA pressure.   3. The mitral valve is normal in structure. No evidence of mitral valve  regurgitation. No evidence of mitral stenosis.   4. The aortic valve was not well visualized. Aortic valve regurgitation  is not visualized. No aortic stenosis is present.   5. Aortic dilatation noted. There is mild dilatation of the aortic root,  measuring 38 mm.    AAA Duplex on 03/30/2020: Abdominal Aorta: No evidence of an abdominal aortic aneurysm was  visualized. The largest aortic measurement is 2.3 cm.   Recent Labs: 07/21/2022: Magnesium 1.9; TSH 4.351 08/08/2022: ALT 27; BUN 24; Creat 1.21; Hemoglobin 13.9; Platelets 350; Potassium 4.2; Sodium 138  Recent Lipid Panel    Component Value Date/Time   CHOL 111 04/12/2022 0808   TRIG 87 04/12/2022 0808   HDL 47 04/12/2022 0808   CHOLHDL 2.4 04/12/2022 0808   VLDL 24 02/13/2017 0922   LDLCALC 47 04/12/2022 0808     Risk Assessment/Calculations:    CHA2DS2-VASc Score = 3  This indicates a 3.2% annual risk of stroke. The patient's score is based upon: CHF History: 0 HTN History: 1 Diabetes History: 0 Stroke History: 0 Vascular Disease History: 0 Age Score: 2 Gender Score: 0     Physical Exam:    VS:  BP 130/82   Pulse 60   Ht '5\' 6"'$  (1.676 m)   Wt 153 lb (69.4 kg)   BMI 24.69 kg/m     Wt Readings from Last 3 Encounters:  09/02/22 153 lb (69.4 kg)  08/20/22 149 lb 6.4 oz (67.8 kg)  08/08/22 150 lb 6.4 oz (68.2 kg)     GEN: Well nourished, well developed in no acute distress HEENT: Normal NECK: No JVD; No carotid bruits LYMPHATICS: No lymphadenopathy CARDIAC: S1/S2, irregular rhythm and regular rate, no murmurs, rubs, gallops; 2+ peripheral pulses throughout, strong and equal bilaterally RESPIRATORY:  Clear and diminished to  auscultation without rales, wheezing or rhonchi  MUSCULOSKELETAL:  No edema; No deformity  SKIN: Warm and dry NEUROLOGIC:  Alert and oriented x 3 PSYCHIATRIC:  Normal affect   ASSESSMENT:    1. Atrial fibrillation, unspecified type ([place])   2. Hypertension, unspecified type   3. Hyperlipidemia, unspecified hyperlipidemia type   4. Aortic dilatation (H[facility])    PLAN:    In order of problems listed above:  Atrial fibrillation, long term anticoagulation Denies any tachycardia or palpitations. EKG today shows A-fib with rate control, 63 bpm.  Discussed options for atrial fibrillation including DCCV, including risk and benefits outlined below, and he is agreeable to proceed.  He is compliant with Eliquis and has not skipped any doses for over a month.  Continue Eliquis 5 mg twice daily, metoprolol tartrate 50 mg twice daily, and diltiazem 240 mg daily.  Will obtain CBC and BMET today per protocol.  Orders are signed and held for upcoming procedure. He is aware that he will continue Eliquis 5 mg twice daily for at least 4 weeks uninterrupted post DCCV.  Shared Decision Making/Informed Consent The risks (stroke, cardiac arrhythmias rarely resulting in the need for a temporary or permanent pacemaker, skin irritation or burns and complications associated with conscious sedation including aspiration, arrhythmia, respiratory failure and death), benefits (restoration of normal sinus rhythm) and alternatives of a direct current cardioversion were explained in detail to Mr. Whittley and he agrees to proceed.   HTN BP today 130/82.  BP well controlled at home.  Continue metoprolol tartrate 50 mg twice daily as scheduled.  We will obtain BMET as mentioned above. Discussed to monitor BP at home at least 2 hours after medications and sitting for 5-10 minutes.  HLD Last LDL 47 in May 2023.  Continue Lipitor 40 mg daily.  PCP to manage.  Aortic dilatation Aortic dilatation noted on echocardiogram on July 19, 2022.  Mild dilatation of the aortic root, measuring 30 mm.  Recommend to update 2D echocardiogram in the following year.     5.  Disposition: Follow-up  with me or Laurann Montana, NP in 1 to 2 weeks post DCCV or sooner if anything changes. Medication Adjustments/Labs and Tests Ordered: Current medicines are reviewed at length with the patient today.  Concerns regarding medicines are outlined above.  Orders Placed This Encounter  Procedures   CBC   Basic metabolic panel   No orders of the defined types were placed in this encounter.   Patient Instructions  Medication Instructions:  Your Physician recommend you continue on your current medication as directed.    *If you need a refill on your cardiac medications before your next appointment, please call your pharmacy*   Lab Work: Your provider has recommended lab work today (CBC, BMP0. Please have this collected at [facility] [place]. The lab is open 8:00 am - 4:30 pm. Please avoid 12:00p - 1:00p for lunch hour. You do not need an appointment. Please go to [address] [place], [place] [address]. This is in the Primary Care office on the 3rd floor, let them know you are there for blood work and they will direct you to the lab.  If you have labs (blood work) drawn today and your tests are completely normal, you will receive your results only by: [place] (if you have MyChart) OR A paper copy in the mail If you have any lab test that is abnormal or we need to change your treatment, we will call you to review the results.   Testing/Procedures: Cardioversion [place]   Follow-Up: At [facility], you and your health needs are our priority.  As part of our continuing mission to provide you with exceptional heart care, we have created designated Provider Care Teams.  These Care Teams include your primary Cardiologist (physician) and Advanced Practice Providers (APPs -  Physician Assistants  and Nurse Practitioners) who all work together to provide you with the care you need, when you need it.  We recommend signing up for the patient portal called "MyChart".  Sign up information is provided on this After Visit Summary.  MyChart is used to connect with patients for Virtual Visits (Telemedicine).  Patients are able to view lab/test results, encounter notes, upcoming appointments, etc.  Non-urgent messages can be sent to your provider as well.   To learn more about what you can do with MyChart, go to [email or web address].    Your next appointment:   09/24/22 @ 9:15 am  The format for your next appointment:   In Person  Provider:   Finis Bud, NP   You are scheduled for a Cardioversion on Thursday 09/12/22 with Dr. Marlou Porch.  Please arrive at the [facility] (Main Entrance A) at [facility]: [address] [place], [place] [address] at 8:30 am  DIET: Nothing to eat or drink after midnight except a sip of water with medications (see medication instructions below)  FYI: For your safety, and to allow [place] to monitor your vital signs accurately during the surgery/procedure we request that   if you have artificial nails, gel coating, SNS etc. Please have those removed prior to your surgery/procedure. Not having the nail coverings /polish removed may result in cancellation or delay of your surgery/procedure.   Medication Instructions: Hold Nicotine Patch day of procedure  Continue your anticoagulant: Eliquis You will need to continue your anticoagulant after your procedure until you  are told by your  Provider that it is safe to stop   Labs: Today (CBC, BMP)  You must have a responsible  person to drive you home and stay in the waiting area during your procedure. Failure to do so could result in cancellation.  Bring your insurance cards.  *Special Note: Every effort is made to have your procedure done on time. Occasionally there are emergencies that occur at the  hospital that may cause delays. Please be patient if a delay does occur.            Signed, Finis Bud, NP  09/02/2022 7:09 PM    [place]

## 2022-09-02 ENCOUNTER — Encounter (HOSPITAL_BASED_OUTPATIENT_CLINIC_OR_DEPARTMENT_OTHER): Payer: Self-pay | Admitting: Nurse Practitioner

## 2022-09-02 ENCOUNTER — Ambulatory Visit (HOSPITAL_BASED_OUTPATIENT_CLINIC_OR_DEPARTMENT_OTHER): Payer: Medicare Other | Admitting: Nurse Practitioner

## 2022-09-02 VITALS — BP 130/82 | HR 60 | Ht 66.0 in | Wt 153.0 lb

## 2022-09-02 DIAGNOSIS — I4891 Unspecified atrial fibrillation: Secondary | ICD-10-CM | POA: Diagnosis not present

## 2022-09-02 DIAGNOSIS — I480 Paroxysmal atrial fibrillation: Secondary | ICD-10-CM | POA: Insufficient documentation

## 2022-09-02 DIAGNOSIS — I77819 Aortic ectasia, unspecified site: Secondary | ICD-10-CM | POA: Diagnosis not present

## 2022-09-02 DIAGNOSIS — I10 Essential (primary) hypertension: Secondary | ICD-10-CM

## 2022-09-02 DIAGNOSIS — E785 Hyperlipidemia, unspecified: Secondary | ICD-10-CM | POA: Diagnosis not present

## 2022-09-02 NOTE — Patient Instructions (Signed)
Medication Instructions:  Your Physician recommend you continue on your current medication as directed.    *If you need a refill on your cardiac medications before your next appointment, please call your pharmacy*   Lab Work: Your provider has recommended lab work today (CBC, BMP0. Please have this collected at [facility]. The lab is open 8:00 am - 4:30 pm. Please avoid 12:00p - 1:00p for lunch hour. You do not need an appointment. Please go to [address] [place], [place] [address]. This is in the Primary Care office on the 3rd floor, let them know you are there for blood work and they will direct you to the lab.  If you have labs (blood work) drawn today and your tests are completely normal, you will receive your results only by: [place] (if you have MyChart) OR A paper copy in the mail If you have any lab test that is abnormal or we need to change your treatment, we will call you to review the results.   Testing/Procedures: Cardioversion [place]   Follow-Up: At [facility], you and your health needs are our priority.  As part of our continuing mission to provide you with exceptional heart care, we have created designated Provider Care Teams.  These Care Teams include your primary Cardiologist (physician) and Advanced Practice Providers (APPs -  Physician Assistants and Nurse Practitioners) who all work together to provide you with the care you need, when you need it.  We recommend signing up for the patient portal called "MyChart".  Sign up information is provided on this After Visit Summary.  MyChart is used to connect with patients for Virtual Visits (Telemedicine).  Patients are able to view lab/test results, encounter notes, upcoming appointments, etc.  Non-urgent messages can be sent to your provider as well.   To learn more about what you can do with MyChart, go to [email or web address].    Your next appointment:    09/24/22 @ 9:15 am  The format for your next appointment:   In Person  Provider:   Finis Bud, NP   You are scheduled for a Cardioversion on Thursday 09/12/22 with Dr. Marlou Porch.  Please arrive at the [facility] (Main Entrance A) at [facility]: [address] [place], [place] [address] at 8:30 am  DIET: Nothing to eat or drink after midnight except a sip of water with medications (see medication instructions below)  FYI: For your safety, and to allow [place] to monitor your vital signs accurately during the surgery/procedure we request that   if you have artificial nails, gel coating, SNS etc. Please have those removed prior to your surgery/procedure. Not having the nail coverings /polish removed may result in cancellation or delay of your surgery/procedure.   Medication Instructions: Hold Nicotine Patch day of procedure  Continue your anticoagulant: Eliquis You will need to continue your anticoagulant after your procedure until you  are told by your  Provider that it is safe to stop   Labs: Today (CBC, BMP)  You must have a responsible person to drive you home and stay in the waiting area during your procedure. Failure to do so could result in cancellation.  Bring your insurance cards.  *Special Note: Every effort is made to have your procedure done on time. Occasionally there are emergencies that occur at the hospital that may cause delays. Please be patient if a delay does occur.

## 2022-09-03 ENCOUNTER — Telehealth (HOSPITAL_BASED_OUTPATIENT_CLINIC_OR_DEPARTMENT_OTHER): Payer: Self-pay

## 2022-09-03 LAB — CBC
Hematocrit: 41.9 % (ref 37.5–51.0)
Hemoglobin: 13.9 g/dL (ref 13.0–17.7)
MCH: 30.1 pg (ref 26.6–33.0)
MCHC: 33.2 g/dL (ref 31.5–35.7)
MCV: 91 fL (ref 79–97)
Platelets: 217 10*3/uL (ref 150–450)
RBC: 4.62 x10E6/uL (ref 4.14–5.80)
RDW: 14.4 % (ref 11.6–15.4)
WBC: 7 10*3/uL (ref 3.4–10.8)

## 2022-09-03 LAB — BASIC METABOLIC PANEL
BUN/Creatinine Ratio: 23 (ref 10–24)
BUN: 28 mg/dL — ABNORMAL HIGH (ref 8–27)
CO2: 22 mmol/L (ref 20–29)
Calcium: 9.6 mg/dL (ref 8.6–10.2)
Chloride: 106 mmol/L (ref 96–106)
Creatinine, Ser: 1.22 mg/dL (ref 0.76–1.27)
Glucose: 90 mg/dL (ref 70–99)
Potassium: 4.2 mmol/L (ref 3.5–5.2)
Sodium: 141 mmol/L (ref 134–144)
eGFR: 61 mL/min/{1.73_m2} (ref >59)

## 2022-09-03 NOTE — Addendum Note (Signed)
Addended by: Gerald Stabs on: 09/03/2022 07:41 AM   Modules accepted: Orders

## 2022-09-03 NOTE — Telephone Encounter (Signed)
Results called to patient who verbalizes understanding!     "Hello Nicholas Huffman,   Your lab work from yesterday is overall normal.  One of your kidney function numbers was up one-point.  I am not concerned about this at this time.  I have signed and held orders for your upcoming cardioversion with Dr. Marlou Porch.  We will continue the current treatment plan as discussed, and I will see back in the office at [facility] on 09/24/22.   It was a pleasure seeing you again and caring for you!    Take Care,   Finis Bud, NP"

## 2022-09-12 ENCOUNTER — Encounter (HOSPITAL_COMMUNITY): Payer: Self-pay | Admitting: Cardiology

## 2022-09-12 ENCOUNTER — Ambulatory Visit (HOSPITAL_COMMUNITY)
Admission: RE | Admit: 2022-09-12 | Discharge: 2022-09-12 | Disposition: A | Discharge status: Home / Self Care | Payer: Medicare Other | Attending: Cardiology | Admitting: Cardiology

## 2022-09-12 ENCOUNTER — Encounter (HOSPITAL_COMMUNITY)
Admission: RE | Disposition: A | Discharge status: Home / Self Care | Payer: Self-pay | Source: Home / Self Care | Attending: Cardiology

## 2022-09-12 ENCOUNTER — Ambulatory Visit (HOSPITAL_COMMUNITY): Payer: Medicare Other | Admitting: Anesthesiology

## 2022-09-12 ENCOUNTER — Other Ambulatory Visit: Payer: Self-pay

## 2022-09-12 ENCOUNTER — Ambulatory Visit (HOSPITAL_BASED_OUTPATIENT_CLINIC_OR_DEPARTMENT_OTHER): Payer: Medicare Other | Admitting: Anesthesiology

## 2022-09-12 DIAGNOSIS — I480 Paroxysmal atrial fibrillation: Secondary | ICD-10-CM | POA: Diagnosis not present

## 2022-09-12 DIAGNOSIS — I4891 Unspecified atrial fibrillation: Secondary | ICD-10-CM | POA: Diagnosis present

## 2022-09-12 DIAGNOSIS — Z87891 Personal history of nicotine dependence: Secondary | ICD-10-CM

## 2022-09-12 DIAGNOSIS — I10 Essential (primary) hypertension: Secondary | ICD-10-CM | POA: Diagnosis not present

## 2022-09-12 DIAGNOSIS — M1990 Unspecified osteoarthritis, unspecified site: Secondary | ICD-10-CM

## 2022-09-12 DIAGNOSIS — Z7901 Long term (current) use of anticoagulants: Secondary | ICD-10-CM | POA: Diagnosis not present

## 2022-09-12 DIAGNOSIS — E785 Hyperlipidemia, unspecified: Secondary | ICD-10-CM | POA: Diagnosis not present

## 2022-09-12 DIAGNOSIS — Z79899 Other long term (current) drug therapy: Secondary | ICD-10-CM | POA: Insufficient documentation

## 2022-09-12 DIAGNOSIS — I77819 Aortic ectasia, unspecified site: Secondary | ICD-10-CM | POA: Insufficient documentation

## 2022-09-12 HISTORY — PX: CARDIOVERSION: SHX1299

## 2022-09-12 SURGERY — CARDIOVERSION
Anesthesia: General

## 2022-09-12 MED ORDER — LIDOCAINE 2% (20 MG/ML) 5 ML SYRINGE
INTRAMUSCULAR | Status: DC | PRN
  Administered 2022-09-12: 60 mg via INTRAVENOUS

## 2022-09-12 MED ORDER — PROPOFOL 10 MG/ML IV BOLUS
INTRAVENOUS | Status: DC | PRN
  Administered 2022-09-12: 50 mg via INTRAVENOUS
  Administered 2022-09-12: 30 mg via INTRAVENOUS

## 2022-09-12 MED ORDER — SODIUM CHLORIDE 0.9 % IV SOLN: INTRAVENOUS | Status: DC

## 2022-09-12 NOTE — CV Procedure (Addendum)
    Electrical Cardioversion Procedure Note Nicholas Huffman [id number] 02-03-1946  Procedure: Electrical Cardioversion Indications:  Atrial Fibrillation  Time Out: Verified patient identification, verified procedure,medications/allergies/relevent history reviewed, required imaging and test results available.  Performed  Procedure Details  The patient was NPO after midnight. Anesthesia was administered at the beside  by [facility] with propofol.  Cardioversion was performed with synchronized biphasic defibrillation via AP pads with 200 joules.  1 attempt(s) were performed.  The patient converted to normal sinus rhythm. The patient tolerated the procedure well   IMPRESSION:  Successful cardioversion of atrial fibrillation to sinus bradycardia 50bpm HR 42 BPM close to DC. Will stop metoprolol 50 BID. Continue with Dilt 240    Nicholas Huffman 09/12/2022, 9:52 AM

## 2022-09-12 NOTE — Anesthesia Preprocedure Evaluation (Signed)
Anesthesia Evaluation  Patient identified by MRN, date of birth, ID band Patient awake    Reviewed: Allergy & Precautions, H&P , NPO status , Patient's Chart, lab work & pertinent test results  Airway Mallampati: II   Neck ROM: full    Dental   Pulmonary former smoker,    breath sounds clear to auscultation       Cardiovascular hypertension, + dysrhythmias Atrial Fibrillation  Rhythm:irregular Rate:Normal     Neuro/Psych    GI/Hepatic   Endo/Other    Renal/GU      Musculoskeletal  (+) Arthritis ,   Abdominal   Peds  Hematology   Anesthesia Other Findings   Reproductive/Obstetrics                             Anesthesia Physical Anesthesia Plan  ASA: 3  Anesthesia Plan: General   Post-op Pain Management:    Induction: Intravenous  PONV Risk Score and Plan: 2 and Propofol infusion and Treatment may vary due to age or medical condition  Airway Management Planned: Mask  Additional Equipment:   Intra-op Plan:   Post-operative Plan:   Informed Consent: I have reviewed the patients History and Physical, chart, labs and discussed the procedure including the risks, benefits and alternatives for the proposed anesthesia with the patient or authorized representative who has indicated his/her understanding and acceptance.     Dental advisory given  Plan Discussed with: CRNA, Anesthesiologist and Surgeon  Anesthesia Plan Comments:         Anesthesia Quick Evaluation

## 2022-09-12 NOTE — Anesthesia Postprocedure Evaluation (Signed)
Anesthesia Post Note  Patient: Nicholas Huffman  Procedure(s) Performed: CARDIOVERSION     Patient location during evaluation: PACU Anesthesia Type: General Level of consciousness: awake and alert Pain management: pain level controlled Vital Signs Assessment: post-procedure vital signs reviewed and stable Respiratory status: spontaneous breathing, nonlabored ventilation and respiratory function stable Cardiovascular status: stable, blood pressure returned to baseline and bradycardic Anesthetic complications: no   No notable events documented.  Last Vitals:  Vitals:   09/12/22 1020 09/12/22 1024  BP: (!) 141/69 (!) 143/70  Pulse: (!) 45 (!) 43  Resp: 19 17  Temp:    SpO2: 94% 95%    Last Pain:  Vitals:   09/12/22 1024  TempSrc:   PainSc: 0-No pain                 Audry Pili

## 2022-09-12 NOTE — Discharge Instructions (Signed)

## 2022-09-12 NOTE — Transfer of Care (Signed)
Immediate Anesthesia Transfer of Care Note  Patient: Nicholas Huffman  Procedure(s) Performed: CARDIOVERSION  Patient Location: Endoscopy Unit  Anesthesia Type:General  Level of Consciousness: drowsy  Airway & Oxygen Therapy: Patient Spontanous Breathing  Post-op Assessment: Report given to RN and Post -op Vital signs reviewed and stable  Post vital signs: Reviewed and stable  Last Vitals:  Vitals Value Taken Time  BP 135/76   Temp    Pulse 51   Resp 28   SpO2 93     Last Pain:  Vitals:   09/12/22 0810  TempSrc: Temporal  PainSc: 0-No pain         Complications: No notable events documented.

## 2022-09-12 NOTE — Interval H&P Note (Signed)
History and Physical Interval Note:  09/12/2022 8:28 AM  Nicholas Huffman  has presented today for surgery, with the diagnosis of afib.  The various methods of treatment have been discussed with the patient and family. After consideration of risks, benefits and other options for treatment, the patient has consented to  Procedure(s): CARDIOVERSION (N/A) as a surgical intervention.  The patient's history has been reviewed, patient examined, no change in status, stable for surgery.  I have reviewed the patient's chart and labs.  Questions were answered to the patient's satisfaction.     [employer]

## 2022-09-12 NOTE — Anesthesia Procedure Notes (Signed)
Procedure Name: General with mask airway Date/Time: 09/12/2022 9:47 AM  Performed by: Lorie Phenix, CRNAPre-anesthesia Checklist: Patient identified, Emergency Drugs available, Suction available, Patient being monitored and Timeout performed Patient Re-evaluated:Patient Re-evaluated prior to induction Oxygen Delivery Method: Ambu bag

## 2022-09-15 ENCOUNTER — Encounter (HOSPITAL_COMMUNITY): Payer: Self-pay | Admitting: Cardiology

## 2022-09-23 NOTE — Progress Notes (Signed)
Cardiology Office Note:    Date:  09/24/2022   ID:  Nicholas Huffman, DOB 1946/07/31, MRN [id number]  PCP:  Susy Frizzle, MD   [place] Providers Cardiologist:  Kirk Ruths, MD     Referring MD: Susy Frizzle, MD   CC: Here for follow-up for post DCCV  History of Present Illness:    Nicholas Huffman is a 76 y.o. male with a hx of the following:   A-fib (on Eliquis) HTN HLD Former smoker Aortic dilatation  Presented to the hospital with abdominal pain on July 17, 2022.  Most previous colonoscopy done in mid July of this year showed evidence of some diverticular disease along with multiple polyps and was placed on 3-year follow-up.  Smokes 1 pack/day times many years.  Underwent exploratory laparotomy, Hartman's procedure on July 17, 2022 with Dr. Redmond Pulling for perforated sigmoid diverticulitis with feculent peritonitis.  Had a surgical JP drain that was removed on day of discharge and remains serious.  Completed 7 days of postop antibiotic therapy.  Went into A-fib with RVR during hospitalization.  Cardiology was consulted and was placed on a Cardizem drip initially, converted to oral Cardizem and Metoprolol that was added for rate control.  Was started on Eliquis 5 mg twice daily.  He was on O2 postop but was able to be weaned off to room air.  Echo revealed LVEF 65 to 70%.   I last saw this patient on 08/20/2022 for hospital follow-up for evaluation of A-fib, HTN, HLD, and tobacco abuse. Overall was doing well from a cardiac perspective. BP was well controlled. Wanted to discuss options for A-fib, but requested to defer any cardiac procedures until he heard back from his GI doctor's recommendations on 08/23/22. BP elevated in office that day, admitted to [facility]. Officially quit smoking.   At last OV with me on 09/02/22, a DCCV was arranged and he underwent DCCV on 09/12/22 and successfully converted to SB, 50 bpm. Metoprolol tartrate 50 mg BID stopped and Diltiazem 240 mg  daily continued. Today he presents for follow-up post DCCV. EKG today shows A-fib with rate controlled, however, he denies any symptoms and thought he was still in SR. Has not skipped any doses of Eliquis and remains compliant with this. Denies any issues of bleeding. Overall, continues to do well from a Cardiac perspective. Denies any CP, SHOB, palpitations, syncope, presyncope, dizziness, orthopnea, PND, swelling, significant weight changes, or claudication. Wife who is present for visit states BP is overall well controlled, but seems as though his BP is starting to creep back up over the past week, averaging 573'U systolic. He will be following up with GI doctor in December to talk about potential surgery in the future. Denies any other questions or concerns.    Past Medical History:  Diagnosis Date   Cancer ([place])    basal cell   Cataract    Fractured skull ([place])    as a child   Hyperlipidemia    Hypertension    Primary localized osteoarthritis of right knee    hx of- knee has been replaced   Smoker    Wears glasses    Wears hearing aid in both ears     Past Surgical History:  Procedure Laterality Date   BASAL CELL CARCINOMA EXCISION  2016   face   CARDIOVERSION N/A 09/12/2022   Procedure: CARDIOVERSION;  Surgeon: Jerline Pain, MD;  Location: [place];  Service: Cardiovascular;  Laterality: N/A;   CATARACT  EXTRACTION W/ INTRAOCULAR LENS  IMPLANT, BILATERAL     COLONOSCOPY  2008, 2002   hx polyps/Dr. Fuller Plan   EYE SURGERY     retina detachment repair x 2   KNEE ARTHROSCOPY  April/2013   left   LAPAROTOMY N/A 07/16/2022   Procedure: EXPLORATORY LAPAROTOMY, HARTMAN'S PROCEDURE;  Surgeon: Greer Pickerel, MD;  Location: [place];  Service: General;  Laterality: N/A;   TOTAL KNEE ARTHROPLASTY Right 03/26/2018   Procedure: RIGHT TOTAL KNEE ARTHROPLASTY;  Surgeon: Leandrew Koyanagi, MD;  Location: [place];  Service: Orthopedics;  Laterality: Right;    Current Medications: Current Meds   Medication Sig   apixaban (ELIQUIS) 5 MG TABS tablet Take 1 tablet (5 mg total) by mouth 2 (two) times daily.   atorvastatin (LIPITOR) 40 MG tablet Take 1 tablet (40 mg total) by mouth daily.   diltiazem (CARDIZEM SR) 120 MG 12 hr capsule Take 2 capsules (240 mg total) by mouth daily.   nicotine (NICODERM CQ - DOSED IN MG/24 HOURS) 14 mg/24hr patch Place 14 mg onto the skin daily.   metoprolol succinate (TOPROL-XL) 25 MG 24 hr tablet Take 1 tablet (25 mg total) by mouth daily. Take with or immediately following a meal.     Allergies:   Patient has no known allergies.   Social History   Socioeconomic History   Marital status: Married    Spouse name: Not on file   Number of children: Not on file   Years of education: Not on file   Highest education level: Not on file  Occupational History   Not on file  Tobacco Use   Smoking status: Former    Packs/day: 1.00    Types: Cigarettes   Smokeless tobacco: Never   Tobacco comments:    pt to call PCP for patch  Vaping Use   Vaping Use: Never used  Substance and Sexual Activity   Alcohol use: No   Drug use: No   Sexual activity: Not on file  Other Topics Concern   Not on file  Social History Narrative   Not on file   Social Determinants of Health   Financial Resource Strain: Not on file  Food Insecurity: Not on file  Transportation Needs: Not on file  Physical Activity: Not on file  Stress: Not on file  Social Connections: Not on file     Family History: The patient's family history includes Breast cancer in his mother; Cancer in his father and mother. There is no history of Colon cancer, Esophageal cancer, Rectal cancer, or Stomach cancer.  ROS:   Review of Systems  Constitutional: Negative.   HENT: Negative.    Eyes: Negative.   Respiratory: Negative.    Cardiovascular: Negative.   Gastrointestinal: Negative.   Genitourinary: Negative.   Musculoskeletal: Negative.   Skin: Negative.   Neurological: Negative.    Endo/Heme/Allergies: Negative.   Psychiatric/Behavioral: Negative.      Please see the history of present illness.    All other systems reviewed and are negative.  EKGs/Labs/Other Studies Reviewed:    The following studies were reviewed today:   EKG:  EKG is ordered today.  The ekg ordered today demonstrates atrial fibrillation, 84 bpm, otherwise no acute changes from EKG dated 09/02/22.   DCCV on 09/12/22: Cardioversion was performed with synchronized biphasic defibrillation via AP pads with 200 joules. 1 attempt(s) were performed.  The patient converted to normal sinus rhythm. The patient tolerated the procedure well.  Successful cardioversion of atrial fibrillation  to sinus bradycardia 50bpm HR 42 BPM close to DC. Will stop metoprolol 50 BID. Continue with Dilt 240  2D Echo on 07/19/2022:  1. Left ventricular ejection fraction, by estimation, is 65 to 70%. The  left ventricle has normal function. The left ventricle has no regional  wall motion abnormalities. Left ventricular diastolic function could not  be evaluated.   2. Right ventricular systolic function is normal. The right ventricular  size is normal. Tricuspid regurgitation signal is inadequate for assessing  PA pressure.   3. The mitral valve is normal in structure. No evidence of mitral valve  regurgitation. No evidence of mitral stenosis.   4. The aortic valve was not well visualized. Aortic valve regurgitation  is not visualized. No aortic stenosis is present.   5. Aortic dilatation noted. There is mild dilatation of the aortic root,  measuring 38 mm.    AAA Duplex on 03/30/2020: Abdominal Aorta: No evidence of an abdominal aortic aneurysm was  visualized. The largest aortic measurement is 2.3 cm.   Recent Labs: 07/21/2022: Magnesium 1.9; TSH 4.351 08/08/2022: ALT 27 09/02/2022: BUN 28; Creatinine, Ser 1.22; Hemoglobin 13.9; Platelets 217; Potassium 4.2; Sodium 141  Recent Lipid Panel    Component Value Date/Time    CHOL 111 04/12/2022 0808   TRIG 87 04/12/2022 0808   HDL 47 04/12/2022 0808   CHOLHDL 2.4 04/12/2022 0808   VLDL 24 02/13/2017 0922   LDLCALC 47 04/12/2022 0808     Risk Assessment/Calculations:    CHA2DS2-VASc Score = 3  This indicates a 3.2% annual risk of stroke. The patient's score is based upon: CHF History: 0 HTN History: 1 Diabetes History: 0 Stroke History: 0 Vascular Disease History: 0 Age Score: 2 Gender Score: 0     Physical Exam:    VS:  BP 130/78 (BP Location: Left Arm, Patient Position: Sitting, Cuff Size: Normal)   Pulse 84   Ht '5\' 6"'$  (1.676 m)   Wt 156 lb (70.8 kg)   BMI 25.18 kg/m     Wt Readings from Last 3 Encounters:  09/24/22 156 lb (70.8 kg)  09/12/22 153 lb (69.4 kg)  09/02/22 153 lb (69.4 kg)     Vitals:   09/24/22 0914 09/24/22 0930  BP: (!) 160/82 130/78  Pulse: 84      GEN: Well nourished, well developed 76 y.o. Caucasian male in no acute distress HEENT: Wears glasses, otherwise normal NECK: No JVD; No carotid bruits CARDIAC: S1/S2, irregular rhythm and regular rate, no murmurs, rubs, gallops; 2+ peripheral pulses throughout, strong and equal bilaterally RESPIRATORY:  Clear to auscultation without rales, wheezing or rhonchi  MUSCULOSKELETAL:  No edema; No deformity  SKIN: Thin skin, Warm and dry NEUROLOGIC:  Alert and oriented x 3 PSYCHIATRIC:  Normal, pleasant affect   ASSESSMENT:    1. Paroxysmal atrial fibrillation (HCC)   2. Current use of long term anticoagulation   3. Hyperlipidemia, unspecified hyperlipidemia type   4. Hypertension, unspecified type   5. Aortic dilatation (HCC)     PLAN:    In order of problems listed above:  PAF, long term anticoagulation Back in A-fib post DCCV. Patient unaware and denies any tachycardia or palpitations. Rate controlled. Compliant with Eliquis and has not skipped any doses post DCCV.  Continue Eliquis 5 mg twice daily and Diltiazem 240 mg daily. Start Metoprolol succinate 25  mg daily for rate control. A-fib Clinic referral today as patient would like to discuss options with providers at the clinic. Discussed avoiding  triggers to A-fib and obtaining Kardia mobile device and to notify clinic with any new or worsening symptoms. Heart healthy diet and regular cardiovascular exercise encouraged.   2. HLD LDL 47 on 04/2022. Continue Lipitor 40 mg daily. PCP to manage. Heart healthy diet and regular cardiovascular exercise encouraged.   3. HTN BP today on arrival 160/82, repeat manual by me 130/78. Start Metoprolol succinate 25 mg daily. Discussed to monitor BP at home at least 2 hours after medications and sitting for 5-10 minutes. Heart healthy diet and regular cardiovascular exercise encouraged.   4. Aortic dilatation Aortic dilatation noted on echocardiogram on July 19, 2022.  Mild dilatation of the aortic root, measuring 30 mm.  Recommend updating 2D echocardiogram in August 2024.    5.  Disposition: Follow-up with Dr. Stanford Breed or APP in 2 months or sooner if anything changes. At next follow-up visit, he may need pre-op clearance for potential future surgery with GI. I advised him that if GI doctor recommends surgery, his office will need to contact our office for clearance. At next visit, he may need preoperative cardiovascular risk assessment in addition to regular follow-up.   Medication Adjustments/Labs and Tests Ordered: Current medicines are reviewed at length with the patient today.  Concerns regarding medicines are outlined above.  Orders Placed This Encounter  Procedures   Amb Referral to AFIB Clinic   EKG 12-Lead   Meds ordered this encounter  Medications   metoprolol succinate (TOPROL-XL) 25 MG 24 hr tablet    Sig: Take 1 tablet (25 mg total) by mouth daily. Take with or immediately following a meal.    Dispense:  90 tablet    Refill:  3    Patient Instructions  Medication Instructions:  Your physician has recommended you make the following change  in your medication:   Start: Metoprolol Succinate '25mg'$  daily  *If you need a refill on your cardiac medications before your next appointment, please call your pharmacy*   Testing/Procedures: Please fax your preoperative clearance request to [phone].   Follow-Up: At [facility], you and your health needs are our priority.  As part of our continuing mission to provide you with exceptional heart care, we have created designated Provider Care Teams.  These Care Teams include your primary Cardiologist (physician) and Advanced Practice Providers (APPs -  Physician Assistants and Nurse Practitioners) who all work together to provide you with the care you need, when you need it.  We recommend signing up for the patient portal called "MyChart".  Sign up information is provided on this After Visit Summary.  MyChart is used to connect with patients for Virtual Visits (Telemedicine).  Patients are able to view lab/test results, encounter notes, upcoming appointments, etc.  Non-urgent messages can be sent to your provider as well.   To learn more about what you can do with MyChart, go to [email or web address].    Your next appointment:   Follow up in 2 months with Dr. Stanford Breed or APP          Signed, Finis Bud, NP  09/24/2022 10:11 AM    [place]

## 2022-09-24 ENCOUNTER — Encounter (HOSPITAL_BASED_OUTPATIENT_CLINIC_OR_DEPARTMENT_OTHER): Payer: Self-pay | Admitting: Nurse Practitioner

## 2022-09-24 ENCOUNTER — Ambulatory Visit (HOSPITAL_BASED_OUTPATIENT_CLINIC_OR_DEPARTMENT_OTHER): Payer: Medicare Other | Admitting: Nurse Practitioner

## 2022-09-24 VITALS — BP 130/78 | HR 84 | Ht 66.0 in | Wt 156.0 lb

## 2022-09-24 DIAGNOSIS — Z7901 Long term (current) use of anticoagulants: Secondary | ICD-10-CM

## 2022-09-24 DIAGNOSIS — I10 Essential (primary) hypertension: Secondary | ICD-10-CM

## 2022-09-24 DIAGNOSIS — I77819 Aortic ectasia, unspecified site: Secondary | ICD-10-CM

## 2022-09-24 DIAGNOSIS — E785 Hyperlipidemia, unspecified: Secondary | ICD-10-CM

## 2022-09-24 DIAGNOSIS — I480 Paroxysmal atrial fibrillation: Secondary | ICD-10-CM

## 2022-09-24 MED ORDER — METOPROLOL SUCCINATE ER 25 MG PO TB24: 25.0000 mg | ORAL_TABLET | Freq: Every day | ORAL | 3 refills | Status: DC

## 2022-09-24 NOTE — Patient Instructions (Signed)
Medication Instructions:  Your physician has recommended you make the following change in your medication:   Start: Metoprolol Succinate '25mg'$  daily  *If you need a refill on your cardiac medications before your next appointment, please call your pharmacy*   Testing/Procedures: Please fax your preoperative clearance request to [phone].   Follow-Up: At [facility], you and your health needs are our priority.  As part of our continuing mission to provide you with exceptional heart care, we have created designated Provider Care Teams.  These Care Teams include your primary Cardiologist (physician) and Advanced Practice Providers (APPs -  Physician Assistants and Nurse Practitioners) who all work together to provide you with the care you need, when you need it.  We recommend signing up for the patient portal called "MyChart".  Sign up information is provided on this After Visit Summary.  MyChart is used to connect with patients for Virtual Visits (Telemedicine).  Patients are able to view lab/test results, encounter notes, upcoming appointments, etc.  Non-urgent messages can be sent to your provider as well.   To learn more about what you can do with MyChart, go to [email or web address].    Your next appointment:   Follow up in 2 months with Dr. Stanford Breed or APP

## 2022-09-26 NOTE — Telephone Encounter (Signed)
Please advise 

## 2022-10-22 ENCOUNTER — Ambulatory Visit (HOSPITAL_COMMUNITY)
Admission: RE | Admit: 2022-10-22 | Discharge: 2022-10-22 | Disposition: A | Discharge status: Home / Self Care | Payer: Medicare Other | Source: Ambulatory Visit | Attending: Physician Assistant | Admitting: Physician Assistant

## 2022-10-22 VITALS — BP 154/76 | HR 67 | Ht 66.0 in | Wt 158.6 lb

## 2022-10-22 DIAGNOSIS — Z7901 Long term (current) use of anticoagulants: Secondary | ICD-10-CM | POA: Diagnosis not present

## 2022-10-22 DIAGNOSIS — I4819 Other persistent atrial fibrillation: Secondary | ICD-10-CM | POA: Diagnosis present

## 2022-10-22 DIAGNOSIS — D6869 Other thrombophilia: Secondary | ICD-10-CM | POA: Diagnosis not present

## 2022-10-22 DIAGNOSIS — Z720 Tobacco use: Secondary | ICD-10-CM | POA: Insufficient documentation

## 2022-10-22 DIAGNOSIS — E785 Hyperlipidemia, unspecified: Secondary | ICD-10-CM | POA: Diagnosis not present

## 2022-10-22 DIAGNOSIS — I10 Essential (primary) hypertension: Secondary | ICD-10-CM | POA: Diagnosis not present

## 2022-10-22 NOTE — Progress Notes (Signed)
Primary Care Physician: Susy Frizzle, MD Primary Cardiologist: Dr Stanford Breed Primary Electrophysiologist: none Referring Physician: Finis Bud NP   Nicholas Huffman is a 76 y.o. male with a history of HTN, HLD, tobacco use, atrial fibrillation who presents for consultation in the [facility].  The patient was initially diagnosed with atrial fibrillation 07/2022 postoperatively following surgery for perforated sigmoid colon. He was rate controlled and started on Eliquis for a CHADS2VASC score of 3. He underwent DCCV on 09/12/22 but was back in afib at follow up on 09/24/22. Patient was unaware of his arrhythmia.   Today, he denies symptoms of palpitations, chest pain, shortness of breath, orthopnea, PND, lower extremity edema, dizziness, presyncope, syncope, snoring, daytime somnolence, bleeding, or neurologic sequela. The patient is tolerating medications without difficulties and is otherwise without complaint today.    Atrial Fibrillation Risk Factors:  he does not have symptoms or diagnosis of sleep apnea. he does not have a history of rheumatic fever.   he has a BMI of Body mass index is 25.6 kg/m.Marland Kitchen Filed Weights   10/22/22 0954  Weight: 71.9 kg    Family History  Problem Relation Age of Onset   Cancer Mother    Breast cancer Mother    Cancer Father    Colon cancer Neg Hx    Esophageal cancer Neg Hx    Rectal cancer Neg Hx    Stomach cancer Neg Hx      Atrial Fibrillation Management history:  Previous antiarrhythmic drugs: none Previous cardioversions: 09/12/22 Previous ablations: none CHADS2VASC score: 3 Anticoagulation history: Eliquis   Past Medical History:  Diagnosis Date   Cancer ([place])    basal cell   Cataract    Fractured skull ([place])    as a child   Hyperlipidemia    Hypertension    Primary localized osteoarthritis of right knee    hx of- knee has been replaced   Smoker    Wears glasses    Wears hearing aid in both  ears    Past Surgical History:  Procedure Laterality Date   BASAL CELL CARCINOMA EXCISION  2016   face   CARDIOVERSION N/A 09/12/2022   Procedure: CARDIOVERSION;  Surgeon: Jerline Pain, MD;  Location: [place];  Service: Cardiovascular;  Laterality: N/A;   CATARACT EXTRACTION W/ INTRAOCULAR LENS  IMPLANT, BILATERAL     COLONOSCOPY  2008, 2002   hx polyps/Dr. Fuller Plan   EYE SURGERY     retina detachment repair x 2   KNEE ARTHROSCOPY  April/2013   left   LAPAROTOMY N/A 07/16/2022   Procedure: EXPLORATORY LAPAROTOMY, HARTMAN'S PROCEDURE;  Surgeon: Greer Pickerel, MD;  Location: [place];  Service: General;  Laterality: N/A;   TOTAL KNEE ARTHROPLASTY Right 03/26/2018   Procedure: RIGHT TOTAL KNEE ARTHROPLASTY;  Surgeon: Leandrew Koyanagi, MD;  Location: [place];  Service: Orthopedics;  Laterality: Right;    Current Outpatient Medications  Medication Sig Dispense Refill   apixaban (ELIQUIS) 5 MG TABS tablet Take 1 tablet (5 mg total) by mouth 2 (two) times daily. 180 tablet 2   atorvastatin (LIPITOR) 40 MG tablet Take 1 tablet (40 mg total) by mouth daily. 90 tablet 3   diltiazem (CARDIZEM SR) 120 MG 12 hr capsule Take 2 capsules (240 mg total) by mouth daily. 180 capsule 2   metoprolol succinate (TOPROL-XL) 25 MG 24 hr tablet Take 1 tablet (25 mg total) by mouth daily. Take with or immediately following a meal. 90  tablet 3   No current facility-administered medications for this encounter.    No Known Allergies  Social History   Socioeconomic History   Marital status: Married    Spouse name: Not on file   Number of children: Not on file   Years of education: Not on file   Highest education level: Not on file  Occupational History   Not on file  Tobacco Use   Smoking status: Former    Packs/day: 1.00    Types: Cigarettes   Smokeless tobacco: Never   Tobacco comments:    pt to call PCP for patch  Vaping Use   Vaping Use: Never used  Substance and Sexual Activity   Alcohol use: No    Drug use: No   Sexual activity: Not on file  Other Topics Concern   Not on file  Social History Narrative   Not on file   Social Determinants of Health   Financial Resource Strain: Not on file  Food Insecurity: Not on file  Transportation Needs: Not on file  Physical Activity: Not on file  Stress: Not on file  Social Connections: Not on file  Intimate Partner Violence: Not on file     ROS- All systems are reviewed and negative except as per the HPI above.  Physical Exam: Vitals:   10/22/22 0954  BP: (!) 154/76  Pulse: 67  Weight: 71.9 kg  Height: '5\' 6"'$  (1.676 m)    GEN- The patient is a well appearing elderly male, alert and oriented x 3 today.   Head- normocephalic, atraumatic Eyes-  Sclera clear, conjunctiva pink Ears- hearing intact Oropharynx- clear Neck- supple  Lungs- Clear to ausculation bilaterally, normal work of breathing Heart- irregular rate and rhythm, no murmurs, rubs or gallops  GI- soft, NT, ND, + BS Extremities- no clubbing, cyanosis, or edema MS- no significant deformity or atrophy Skin- no rash or lesion Psych- euthymic mood, full affect Neuro- strength and sensation are intact  Wt Readings from Last 3 Encounters:  10/22/22 71.9 kg  09/24/22 70.8 kg  09/12/22 69.4 kg    EKG today demonstrates  Afib Vent. rate 67 BPM PR interval * ms QRS duration 102 ms QT/QTcB 404/426 ms  Echo 07/19/22 demonstrated   1. Left ventricular ejection fraction, by estimation, is 65 to 70%. The  left ventricle has normal function. The left ventricle has no regional  wall motion abnormalities. Left ventricular diastolic function could not  be evaluated.   2. Right ventricular systolic function is normal. The right ventricular  size is normal. Tricuspid regurgitation signal is inadequate for assessing  PA pressure.   3. The mitral valve is normal in structure. No evidence of mitral valve  regurgitation. No evidence of mitral stenosis.   4. The aortic valve  was not well visualized. Aortic valve regurgitation  is not visualized. No aortic stenosis is present.   5. Aortic dilatation noted. There is mild dilatation of the aortic root,  measuring 38 mm.    Epic records are reviewed at length today  CHA2DS2-VASc Score = 3  The patient's score is based upon: CHF History: 0 HTN History: 1 Diabetes History: 0 Stroke History: 0 Vascular Disease History: 0 Age Score: 2 Gender Score: 0       ASSESSMENT AND PLAN: 1. Persistent Atrial Fibrillation (ICD10:  I48.19) The patient's CHA2DS2-VASc score is 3, indicating a 3.2% annual risk of stroke.   Diagnosed postoperatively S/p DCCV 09/12/22 with early return of afib We discussed rate  vs rhythm control today. With rhythm control, we discussed flecainide, Multaq, dofetilide, amiodarone, and ablation. He would like to pursue a rate control strategy at this time. We discussed the implications of the EAST AFNET trial regarding rate vs rhythm control. Continue diltiazem 240 mg daily Continue Toprol 25 mg daily Continue Eliquis 5 mg BID  2. Secondary Hypercoagulable State (ICD10:  D68.69) The patient is at significant risk for stroke/thromboembolism based upon his CHA2DS2-VASc Score of 3.  Continue Apixaban (Eliquis).   3. HTN Stable, no changes today.   Follow up with Finis Bud or Dr Stanford Breed in one month. AF clinic as needed if he decides to pursue rhythm control.    [facility] [address] [place], [place] [address] [phone] 10/22/2022 10:23 AM

## 2022-11-22 ENCOUNTER — Other Ambulatory Visit: Payer: Self-pay | Admitting: Surgery

## 2022-11-22 DIAGNOSIS — Z933 Colostomy status: Secondary | ICD-10-CM

## 2022-11-25 ENCOUNTER — Ambulatory Visit: Payer: Medicare Other | Admitting: General Practice

## 2022-12-13 ENCOUNTER — Ambulatory Visit: Payer: Medicare Other | Attending: General Practice | Admitting: Student

## 2022-12-13 ENCOUNTER — Encounter: Payer: Self-pay | Admitting: General Practice

## 2022-12-13 VITALS — BP 150/70 | HR 62 | Ht 66.0 in | Wt 164.4 lb

## 2022-12-13 DIAGNOSIS — I10 Essential (primary) hypertension: Secondary | ICD-10-CM | POA: Diagnosis not present

## 2022-12-13 DIAGNOSIS — E785 Hyperlipidemia, unspecified: Secondary | ICD-10-CM

## 2022-12-13 DIAGNOSIS — Z01810 Encounter for preprocedural cardiovascular examination: Secondary | ICD-10-CM

## 2022-12-13 DIAGNOSIS — I480 Paroxysmal atrial fibrillation: Secondary | ICD-10-CM

## 2022-12-13 NOTE — Progress Notes (Signed)
Cardiology Clinic Note   Patient Name: [name] Date of Encounter: 12/13/2022  Primary Care Provider:  Susy Frizzle, MD Primary Cardiologist:  Kirk Ruths, MD  Patient Profile    Nicholas Huffman is a 77 y.o. male with a past medical history of PAF on anticoagulation, aortic dilatation, hypertension, hyperlipidemia, tobacco abuse who presents to the clinic today for 1 month follow-up.   Past Medical History    Past Medical History:  Diagnosis Date   Cancer ([place])    basal cell   Cataract    Fractured skull ([place])    as a child   Hyperlipidemia    Hypertension    Primary localized osteoarthritis of right knee    hx of- knee has been replaced   Smoker    Wears glasses    Wears hearing aid in both ears    Past Surgical History:  Procedure Laterality Date   BASAL CELL CARCINOMA EXCISION  2016   face   CARDIOVERSION N/A 09/12/2022   Procedure: CARDIOVERSION;  Surgeon: Jerline Pain, MD;  Location: [facility];  Service: Cardiovascular;  Laterality: N/A;   CATARACT EXTRACTION W/ INTRAOCULAR LENS  IMPLANT, BILATERAL     COLONOSCOPY  2008, 2002   hx polyps/Dr. Fuller Plan   EYE SURGERY     retina detachment repair x 2   KNEE ARTHROSCOPY  April/2013   left   LAPAROTOMY N/A 07/16/2022   Procedure: EXPLORATORY LAPAROTOMY, HARTMAN'S PROCEDURE;  Surgeon: Greer Pickerel, MD;  Location: [facility];  Service: General;  Laterality: N/A;   TOTAL KNEE ARTHROPLASTY Right 03/26/2018   Procedure: RIGHT TOTAL KNEE ARTHROPLASTY;  Surgeon: Leandrew Koyanagi, MD;  Location: [facility];  Service: Orthopedics;  Laterality: Right;    Allergies  No Known Allergies  History of Present Illness    [name] has a past medical history of: PAF. Echo 07/19/2022: EF 65-70%. 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 Aortic dilatation.  Hypertension. Hyperlipidemia.  Lipid panel 04/12/2022: LDL 47, HDL 47, TG 87, total 111. Tobacco abuse.   Mr. Wegener was first evaluated by Dr. Stanford Breed on  07/19/2022 during hospitalization. Patient was admitted for abdominal pain and found to have perforated sigmoid diverticulitis/peritonitis requiring surgery. He developed asymptotic afib postoperatively in the early AM on 07/18/2022. He denied chest pain, DOE, shortness of breath, edema, orthopnea, PND, or palpitations. CHA2DS2-VASc 3. Eliquis started. He was discharged from the hospital on 07/23/2022 on metoprolol, diltiazem, and Eliquis. Patient underwent successful cardioversion on 09/12/2022. Patient was bradycardic with heart rate down to 42. Metoprolol 50 mg bid was changed to Toprol 25 mg daily. Continued on diltiazem and Eliquis.  Patient was last seen in the office by Malka So, PA-C on 10/22/2022.  At that time he had return to A-fib.  Patient wanted to pursue rate control and Toprol, diltiazem and Eliquis were continued.   Today, patient is doing well. Patient denies shortness of breath or dyspnea on exertion. No chest pain, pressure, or tightness. Denies lower extremity edema, orthopnea, or PND. He has no cardiac awareness of afib. He reports he has upcoming surgery to reverse his ostomy and was told he would need cardiac clearance. He stays active walking his dog 1/4 mile 2-3 times a day without shortness of breath or chest pain.    Home Medications    Current Meds  Medication Sig   apixaban (ELIQUIS) 5 MG TABS tablet Take 1 tablet (5 mg total) by mouth 2 (two) times daily.  atorvastatin (LIPITOR) 40 MG tablet Take 1 tablet (40 mg total) by mouth daily.   diltiazem (CARDIZEM SR) 120 MG 12 hr capsule Take 2 capsules (240 mg total) by mouth daily.   metoprolol succinate (TOPROL-XL) 25 MG 24 hr tablet Take 1 tablet (25 mg total) by mouth daily. Take with or immediately following a meal.    Family History    Family History  Problem Relation Age of Onset   Cancer Mother    Breast cancer Mother    Cancer Father    Colon cancer Neg Hx    Esophageal cancer Neg Hx    Rectal cancer Neg Hx     Stomach cancer Neg Hx    He indicated that his mother is deceased. He indicated that his father is deceased. He indicated that only one of his two brothers is alive. He indicated that the status of his neg hx is unknown.   Social History    Social History   Socioeconomic History   Marital status: Married    Spouse name: Not on file   Number of children: Not on file   Years of education: Not on file   Highest education level: Not on file  Occupational History   Not on file  Tobacco Use   Smoking status: Former    Packs/day: 1.00    Types: Cigarettes   Smokeless tobacco: Never   Tobacco comments:    pt to call PCP for patch  Vaping Use   Vaping Use: Never used  Substance and Sexual Activity   Alcohol use: No   Drug use: No   Sexual activity: Not on file  Other Topics Concern   Not on file  Social History Narrative   Not on file   Social Determinants of Health   Financial Resource Strain: Not on file  Food Insecurity: Not on file  Transportation Needs: Not on file  Physical Activity: Not on file  Stress: Not on file  Social Connections: Not on file  Intimate Partner Violence: Not on file     Review of Systems    General:  No chills, fever, night sweats or weight changes.  Cardiovascular:  No chest pain, dyspnea on exertion, edema, orthopnea, palpitations, paroxysmal nocturnal dyspnea. Dermatological: No rash, lesions/masses Respiratory: No cough, dyspnea Urologic: No hematuria, dysuria Abdominal:   No nausea, vomiting, diarrhea, bright red blood per rectum, melena, or hematemesis Neurologic:  No visual changes, weakness, changes in mental status. All other systems reviewed and are otherwise negative except as noted above.  Physical Exam    VS:  BP (!) 156/92 (BP Location: Left Arm, Patient Position: Sitting, Cuff Size: Normal)   Pulse 62   Ht '5\' 6"'$  (1.676 m)   Wt 164 lb 6.4 oz (74.6 kg)   SpO2 97%   BMI 26.53 kg/m  , BMI Body mass index is 26.53  kg/m. GEN:  Well nourished, well developed, in no acute distress. HEENT: Normal. Neck: Supple, no JVD, carotid bruits, or masses. Cardiac: Irregular rhythm, no murmurs, rubs, or gallops. No clubbing, cyanosis, edema.  Radials/DP/PT 2+ and equal bilaterally.  Respiratory:  Respirations regular and unlabored, clear to auscultation bilaterally. GI: Soft, nontender, nondistended. MS: No deformity or atrophy. Skin: Warm and dry, no rash. Neuro: Strength and sensation are intact. Psych: Normal affect.  Accessory Clinical Findings    Recent Labs: 07/21/2022: Magnesium 1.9; TSH 4.351 08/08/2022: ALT 27 09/02/2022: BUN 28; Creatinine, Ser 1.22; Hemoglobin 13.9; Platelets 217; Potassium 4.2; Sodium 141  Recent Lipid Panel    Component Value Date/Time   CHOL 111 04/12/2022 0808   TRIG 87 04/12/2022 0808   HDL 47 04/12/2022 0808   CHOLHDL 2.4 04/12/2022 0808   VLDL 24 02/13/2017 0922   LDLCALC 47 04/12/2022 0808    HYPERTENSION CONTROL Vitals:   12/13/22 1125 12/13/22 1635  BP: (!) 156/92 (!) 150/70    The patient's blood pressure is elevated above target today.  In order to address the patient's elevated BP: Blood pressure will be monitored at home to determine if medication changes need to be made.       ECG personally reviewed by me today: afib, HR 60  No significant changes from 10/22/2022   CHA2DS2-VASc Score = 3   This indicates a 3.2% annual risk of stroke. The patient's score is based upon: CHF History: 0 HTN History: 1 Diabetes History: 0 Stroke History: 0 Vascular Disease History: 0 Age Score: 2 Gender Score: 0      Assessment & Plan   PAF. Patient first episode of afib post surgery for perforated sigmoid diverticulitis/peritonitis in August 2023. He underwent successful cardioversion in October 2023, however, returned to afib shortly after. He decided to purse rate control. Patient reports he has no cardiac awareness of afib. He is current in afib per EKG  with rate of 60. Continue diltiazem, metoprolol, and Eliquis.  Hypertension. BP today 156/92 at intake and 150/70 at recheck. He reports his BP is always high in the doctor's office. He checks it intermittently at home with a wrist cuff and states it is normally in the 130/80s.  Patient denies headaches or dizziness. He will check his BP at local pharmacy three times a week and call if consisently >130/80.  Hyperlipidemia. LDL May 2023 47, at goal. Continue Lipitor.  Preoperative cardiovascular risk assessment.  Patient does not have a history of ischemic heart disease, PCI, CVA, heart failure, or stroke. According to the RCRI, patient has a 0.4% risk of MACE. Patient reports activity equivalent to 4.0 METS (Walking dog 1/4 mile 2-3 times a day, yard work, and ADLs). Based on ACC/AHA guidelines, ROMY MCGUE would be at acceptable risk for the planned procedure without further cardiovascular testing. Will reach out to pharmacy for recommendations for possibly holding Eliquis. After recommendations are received will forward note to [facility], Dr. Dema Severin.  AddendumMarcelle Overlie, RPH-CCP, patient may hold Eliquis two days prior to procedure.      Disposition: Return in 3 months or sooner as needed.    Justice Britain. Charlen Bakula, DNP, NP-C     12/13/2022, 11:32 AM [address] Office [phone] Fax [phone]

## 2022-12-13 NOTE — Patient Instructions (Signed)
Medication Instructions:  The current medical regimen is effective;  continue present plan and medications as directed. Please refer to the Current Medication list given to you today.  *If you need a refill on your cardiac medications before your next appointment, please call your pharmacy*  Lab Work: NONE If you have labs (blood work) drawn today and your tests are completely normal, you will receive your results only by:  [place] (if you have MyChart) OR A paper copy in the mail If you have any lab test that is abnormal or we need to change your treatment, we will call you to review the results.  Testing/Procedures: NONE  Other Instructions TAKE AND LOG YOUR BLOOD PRESSURE; IF YOUR BP IS > 130/80 CALL us TO LET us KNOW  Follow-Up: At [facility], you and your health needs are our priority.  As part of our continuing mission to provide you with exceptional heart care, we have created designated Provider Care Teams.  These Care Teams include your primary Cardiologist (physician) and Advanced Practice Providers (APPs -  Physician Assistants and Nurse Practitioners) who all work together to provide you with the care you need, when you need it.  Your next appointment:   3 month(s)  The format for your next appointment:   In Person  Provider:   [place]

## 2022-12-16 NOTE — Addendum Note (Signed)
Addended by: Waylan Rocher on: 12/16/2022 09:12 AM   Modules accepted: Orders

## 2022-12-30 ENCOUNTER — Telehealth: Payer: Self-pay | Admitting: Cardiology

## 2022-12-30 NOTE — Telephone Encounter (Signed)
Patient called to follow-up on surgical clearance.  Patient stated he had in-office visit on 1/5.

## 2022-12-30 NOTE — Telephone Encounter (Signed)
Returned call to pt, he has advised me that we will be receiving a clearance request from his surgeon's office.  He has already seen Mayra Reel, NP, and has been cleared, we just need the official request.

## 2022-12-31 ENCOUNTER — Other Ambulatory Visit: Payer: Self-pay

## 2022-12-31 DIAGNOSIS — Z79899 Other long term (current) drug therapy: Secondary | ICD-10-CM

## 2022-12-31 MED ORDER — IRBESARTAN 150 MG PO TABS: 150.0000 mg | ORAL_TABLET | Freq: Every day | ORAL | 1 refills | Status: DC

## 2022-12-31 NOTE — Addendum Note (Signed)
Addended by: Betha Loa F on: 12/31/2022 12:57 PM   Modules accepted: Orders

## 2022-12-31 NOTE — Telephone Encounter (Signed)
Patient stated his last 3 BP were 151/69, 158/81, and 164/94. He uses a wrist cuff. He stated he checked BP at 2 different drug stores and got the same number. He denies chest pain or any symptoms. He has not increased Na+ or caffeine in his diet. Please advise on BP.

## 2022-12-31 NOTE — Telephone Encounter (Signed)
Informed patient of adding irbesartan '150mg'$  daily by mouth, BMET in 1 week, and to continue to monitor BP/P. He had no questions or concerns at this time.

## 2023-01-01 ENCOUNTER — Telehealth: Payer: Self-pay

## 2023-01-01 NOTE — Telephone Encounter (Signed)
   Pre-operative Risk Assessment    Patient Name: Nicholas Huffman  DOB: 1946-01-03 MRN: [id number]      Request for Surgical Clearance    Procedure:   Robotic colostomy reversal with possible low anterior resection   Date of Surgery:  Clearance TBD                                 Surgeon:  Dr. Nadeen Landau  Surgeon's Group or Practice Name:  [facility]  Phone number:  [phone] Fax number:  [phone]   Type of Clearance Requested:   - Medical  - Pharmacy:  Hold Apixaban (Eliquis) need instructions    Type of Anesthesia:  General    Additional requests/questions:    Tana Conch   01/01/2023, 3:48 PM

## 2023-01-06 NOTE — Telephone Encounter (Signed)
   Primary Cardiologist: Kirk Ruths, MD  Chart reviewed as part of pre-operative protocol coverage. Based on ACC/AHA guidelines, Nicholas Huffman was evaluated by Mayra Reel, NP in the office on 12/13/22 at which time he was deemed acceptable risk for the planned procedure without further cardiovascular testing.   Patient was advised that if he develops new symptoms prior to surgery to contact our office to arrange a follow-up appointment.    Per office protocol, patient can hold Eliquis for 3 days prior to procedure.   I will route this recommendation to the requesting party via Epic fax function and remove from pre-op pool.  Please call with questions.  Emmaline Life, NP-C  01/06/2023, 9:27 AM [address], [address] Office [phone] Fax [phone]

## 2023-01-06 NOTE — Telephone Encounter (Signed)
Patient with diagnosis of PAF(paroxysmal atrial fibrillation) on Eliquis for anticoagulation.    Procedure:  Robotic colostomy reversal with possible low anterior resection  Date of procedure: TBD   CHA2DS2-VASc Score = 3   This indicates a 3.2% annual risk of stroke. The patient's score is based upon: CHF History: 0 HTN History: 1 Diabetes History: 0 Stroke History: 0 Vascular Disease History: 0 Age Score: 2 Gender Score: 0      CrCl 50 ml/min ( SrCr. 1.22 on 09/02/2022) Platelet count 217    Per office protocol, patient can hold Eliquis for 3 days prior to procedure.     **This guidance is not considered finalized until pre-operative APP has relayed final recommendations.**

## 2023-01-08 ENCOUNTER — Other Ambulatory Visit: Payer: Self-pay | Admitting: Urology

## 2023-01-08 LAB — BASIC METABOLIC PANEL
BUN/Creatinine Ratio: 21 (ref 10–24)
BUN: 24 mg/dL (ref 8–27)
CO2: 22 mmol/L (ref 20–29)
Calcium: 9.8 mg/dL (ref 8.6–10.2)
Chloride: 101 mmol/L (ref 96–106)
Creatinine, Ser: 1.16 mg/dL (ref 0.76–1.27)
Glucose: 85 mg/dL (ref 70–99)
Potassium: 4.6 mmol/L (ref 3.5–5.2)
Sodium: 141 mmol/L (ref 134–144)
eGFR: 65 mL/min/{1.73_m2} (ref >59)

## 2023-01-31 ENCOUNTER — Ambulatory Visit: Payer: Self-pay | Admitting: Surgery

## 2023-01-31 DIAGNOSIS — Z01818 Encounter for other preprocedural examination: Secondary | ICD-10-CM

## 2023-01-31 NOTE — Progress Notes (Signed)
Sent message, via epic in basket, requesting orders in epic from surgeon.  

## 2023-02-05 NOTE — Patient Instructions (Addendum)
SURGICAL WAITING ROOM VISITATION Patients having surgery or a procedure may have no more than 2 support people in the waiting area - these visitors may rotate.    If the patient needs to stay at the hospital during part of their recovery, the visitor guidelines for inpatient rooms apply. Pre-op nurse will coordinate an appropriate time for 1 support person to accompany patient in pre-op.  This support person may not rotate.    Please refer to the [facility] website for the visitor guidelines for Inpatients (after your surgery is over and you are in a regular room).   Due to an increase in RSV and influenza rates and associated hospitalizations, children ages 30 and under may not visit patients in [place].     Your procedure is scheduled on: 02-19-23   Report to [facility] Main Entrance    Report to admitting at 6:15 AM   Call this number if you have problems the morning of surgery [phone]   Follow a clear liquid diet day of prep to prevent dehydration   After Midnight you may have the following liquids until 5:30 AM DAY OF SURGERY  Water Non-Citrus Juices (without pulp, NO RED) Carbonated Beverages Black Coffee (NO MILK/CREAM OR CREAMERS, sugar ok)  Clear Tea (NO MILK/CREAM OR CREAMERS, sugar ok) regular and decaf                             Plain Jell-O (NO RED)                                           Fruit ices (not with fruit pulp, NO RED)                                     Popsicles (NO RED)                                                               Sports drinks like Gatorade (NO RED)  Drink 2 Pre-surgery clear Ensure the evening before surgery (complete by 10 PM)                   The day of surgery:  Drink ONE (1) Pre-Surgery Clear Ensure at 5:30 AM the morning of surgery. Drink in one sitting. Do not sip.  This drink was given to you during your hospital  pre-op appointment visit. Nothing else to drink after completing the  Pre-Surgery Clear Ensure .          If you have questions, please contact your surgeon's office.   FOLLOW BOWEL PREP AND ANY ADDITIONAL PRE OP INSTRUCTIONS YOU RECEIVED FROM YOUR SURGEON'S OFFICE!!!  -Clear liquids day of prep to prevent dehydration  - Bisacodyl (Dulcolax) 20 mg - Give with water the day prior to surgery.   - Miralax 255g - Mix with 64 oz Gatorade/Powerade.  Drink gradually over the next few hours (8 oz glass every 15-30 minutes) until gone the day prior to surgery.   -Metronidazole (Flagyl) 1000 mg - At  2 pm, 3 pm and 10 pm after Miralax bowel prep the day prior to surgery.       -Neomycin 1000 mg - At 2 pm, 3 pm and 10 pm after Miralax  bowel prep the day prior to surgery.    Oral Hygiene is also important to reduce your risk of infection.                                    Remember - BRUSH YOUR TEETH THE MORNING OF SURGERY WITH YOUR REGULAR TOOTHPASTE   Do NOT smoke after Midnight   Take these medicines the morning of surgery with A SIP OF WATER:   Atorvastatin  Diltiazem  Metoprolol  Hold Eliquis 3 days before surgery (do not take after 02-15-23)                              You may not have any metal on your body including, jewelry, and body piercing             Do not wear  lotions, powders, cologne, or deodorant              Men may shave face and neck.   Do not bring valuables to the hospital. [place].   Contacts, dentures or bridgework may not be worn into surgery.   Bring small overnight bag day of surgery.   DO NOT [place]. PHARMACY WILL DISPENSE MEDICATIONS LISTED ON YOUR MEDICATION LIST TO YOU DURING YOUR ADMISSION [place]!    Special Instructions: Bring a copy of your healthcare power of attorney and living will documents the day of surgery if you haven't scanned them before.              Please read over the following fact sheets you were given: IF [place]  If you received a COVID test during your pre-op visit  it is requested that you wear a mask when out in public, stay away from anyone that may not be feeling well and notify your surgeon if you develop symptoms. If you test positive for Covid or have been in contact with anyone that has tested positive in the last 10 days please notify you surgeon.  [facility] - Preparing for Surgery Before surgery, you can play an important role.  Because skin is not sterile, your skin needs to be as free of germs as possible.  You can reduce the number of germs on your skin by washing with CHG (chlorahexidine gluconate) soap before surgery.  CHG is an antiseptic cleaner which kills germs and bonds with the skin to continue killing germs even after washing. Please DO NOT use if you have an allergy to CHG or antibacterial soaps.  If your skin becomes reddened/irritated stop using the CHG and inform your nurse when you arrive at Short Stay. Do not shave (including legs and underarms) for at least 48 hours prior to the first CHG shower.  You may shave your face/neck.  Please follow these instructions carefully:  1.  Shower with CHG Soap the night before surgery and the  morning of surgery.  2.  If you choose to wash your hair, wash your hair first as usual with your  normal  shampoo.  3.  After you shampoo, rinse your hair and body thoroughly to remove the shampoo.                             4.  Use CHG as you would any other liquid soap.  You can apply chg directly to the skin and wash.  Gently with a scrungie or clean washcloth.  5.  Apply the CHG Soap to your body ONLY FROM THE NECK DOWN.   Do   not use on face/ open                           Wound or open sores. Avoid contact with eyes, ears mouth and   genitals (private parts).                       Wash face,  Genitals (private parts) with your normal soap.             6.  Wash thoroughly,  paying special attention to the area where your    surgery  will be performed.  7.  Thoroughly rinse your body with warm water from the neck down.  8.  DO NOT shower/wash with your normal soap after using and rinsing off the CHG Soap.                9.  Pat yourself dry with a clean towel.            10.  Wear clean pajamas.            11.  Place clean sheets on your bed the night of your first shower and do not  sleep with pets. Day of Surgery : Do not apply any lotions/deodorants the morning of surgery.  Please wear clean clothes to the hospital/surgery center.  FAILURE TO FOLLOW THESE INSTRUCTIONS MAY RESULT IN THE CANCELLATION OF YOUR SURGERY  PATIENT SIGNATURE_________________________________  NURSE SIGNATURE__________________________________  ________________________________________________________________________    Adam [place]ix  An incentive spirometer is a tool that can help keep your lungs clear and active. This tool measures how well you are filling your lungs with each breath. Taking long deep breaths may help reverse or decrease the chance of developing breathing (pulmonary) problems (especially infection) following: A long period of time when you are unable to move or be active. BEFORE THE PROCEDURE  If the spirometer includes an indicator to show your best effort, your nurse or respiratory therapist will set it to a desired goal. If possible, sit up straight or lean slightly forward. Try not to slouch. Hold the incentive spirometer in an upright position. INSTRUCTIONS FOR USE  Sit on the edge of your bed if possible, or sit up as far as you can in bed or on a chair. Hold the incentive spirometer in an upright position. Breathe out normally. Place the mouthpiece in your mouth and seal your lips tightly around it. Breathe in slowly and as deeply as possible, raising the piston or the ball toward the top of the column. Hold your breath for 3-5 seconds or for as  long as possible. Allow the piston or ball to fall to the bottom of the column. Remove the mouthpiece from your mouth and breathe out normally. Rest for a few seconds and repeat Steps 1 through 7 at least 10 times every 1-2 hours when you are  awake. Take your time and take a few normal breaths between deep breaths. The spirometer may include an indicator to show your best effort. Use the indicator as a goal to work toward during each repetition. After each set of 10 deep breaths, practice coughing to be sure your lungs are clear. If you have an incision (the cut made at the time of surgery), support your incision when coughing by placing a pillow or rolled up towels firmly against it. Once you are able to get out of bed, walk around indoors and cough well. You may stop using the incentive spirometer when instructed by your caregiver.  RISKS AND COMPLICATIONS Take your time so you do not get dizzy or light-headed. If you are in pain, you may need to take or ask for pain medication before doing incentive spirometry. It is harder to take a deep breath if you are having pain. AFTER USE Rest and breathe slowly and easily. It can be helpful to keep track of a log of your progress. Your caregiver can provide you with a simple table to help with this. If you are using the spirometer at home, follow these instructions: [place] IF:  You are having difficultly using the spirometer. You have trouble using the spirometer as often as instructed. Your pain medication is not giving enough relief while using the spirometer. You develop fever of 100.5 F (38.1 C) or higher. SEEK IMMEDIATE MEDICAL CARE IF:  You cough up bloody sputum that had not been present before. You develop fever of 102 F (38.9 C) or greater. You develop worsening pain at or near the incision site. MAKE SURE YOU:  Understand these instructions. Will watch your condition. Will get help right away if you are not doing well or  get worse. Document Released: 04/07/2007 Document Revised: 02/17/2012 Document Reviewed: 06/08/2007 ExitCare Patient Information 2014 [employer], [place].   ________________________________________________________________________   WHAT IS A BLOOD TRANSFUSION? Blood Transfusion Information  A transfusion is the replacement of blood or some of its parts. Blood is made up of multiple cells which provide different functions. Red blood cells carry oxygen and are used for blood loss replacement. White blood cells fight against infection. Platelets control bleeding. Plasma helps clot blood. Other blood products are available for specialized needs, such as hemophilia or other clotting disorders. BEFORE THE TRANSFUSION  Who gives blood for transfusions?  Healthy volunteers who are fully evaluated to make sure their blood is safe. This is blood bank blood. Transfusion therapy is the safest it has ever been in the practice of medicine. Before blood is taken from a donor, a complete history is taken to make sure that person has no history of diseases nor engages in risky social behavior (examples are intravenous drug use or sexual activity with multiple partners). The donor's travel history is screened to minimize risk of transmitting infections, such as malaria. The donated blood is tested for signs of infectious diseases, such as HIV and hepatitis. The blood is then tested to be sure it is compatible with you in order to minimize the chance of a transfusion reaction. If you or a relative donates blood, this is often done in anticipation of surgery and is not appropriate for emergency situations. It takes many days to process the donated blood. RISKS AND COMPLICATIONS Although transfusion therapy is very safe and saves many lives, the main dangers of transfusion include:  Getting an infectious disease. Developing a transfusion reaction. This is an allergic reaction to something in  the blood you were given.  Every precaution is taken to prevent this. The decision to have a blood transfusion has been considered carefully by your caregiver before blood is given. Blood is not given unless the benefits outweigh the risks. AFTER THE TRANSFUSION Right after receiving a blood transfusion, you will usually feel much better and more energetic. This is especially true if your red blood cells have gotten low (anemic). The transfusion raises the level of the red blood cells which carry oxygen, and this usually causes an energy increase. The nurse administering the transfusion will monitor you carefully for complications. HOME CARE INSTRUCTIONS  No special instructions are needed after a transfusion. You may find your energy is better. Speak with your caregiver about any limitations on activity for underlying diseases you may have. SEEK MEDICAL CARE IF:  Your condition is not improving after your transfusion. You develop redness or irritation at the intravenous (IV) site. SEEK IMMEDIATE MEDICAL CARE IF:  Any of the following symptoms occur over the next 12 hours: Shaking chills. You have a temperature by mouth above 102 F (38.9 C), not controlled by medicine. Chest, back, or muscle pain. People around you feel you are not acting correctly or are confused. Shortness of breath or difficulty breathing. Dizziness and fainting. You get a rash or develop hives. You have a decrease in urine output. Your urine turns a dark color or changes to pink, red, or brown. Any of the following symptoms occur over the next 10 days: You have a temperature by mouth above 102 F (38.9 C), not controlled by medicine. Shortness of breath. Weakness after normal activity. The white part of the eye turns yellow (jaundice). You have a decrease in the amount of urine or are urinating less often. Your urine turns a dark color or changes to pink, red, or brown. Document Released: 11/22/2000 Document Revised: 02/17/2012 Document  Reviewed: 07/11/2008 [facility] Patient Information 2014 [place], [place].  _______________________________________________________________________

## 2023-02-05 NOTE — Progress Notes (Addendum)
COVID Vaccine Completed:  Yes  Date of COVID positive in last 15 days:No  PCP - Jenna Luo, MD Cardiologist - Kirk Ruths, MD  Cardiac clearance in Epic dated 01-06-23 by Christen Bame, NP-C  Chest x-ray - 08-09-22 Epic EKG - 12-13-22 Epic Stress Test - several years ago ECHO - 07-19-22 Epic Cardiac Cath - N/A Pacemaker/ICD device last checked: Spinal Cord Stimulator:N/A AAA [place] - 03-30-20 Epic  Bowel Prep - Yes, patient has instructions and prescriptions  Sleep Study - N/A CPAP -   Fasting Blood Sugar - N/A Checks Blood Sugar _____ times a day  Last dose of GLP1 agonist-  N/A GLP1 instructions:  N/A   Last dose of SGLT-2 inhibitors-  N/A SGLT-2 instructions: N/A   Blood Thinner Instructions:  Eliquis.  Hold x3 days. Patient is aware Aspirin Instructions: Last Dose:  Activity level:  Can go up a flight of stairs and perform activities of daily living without stopping and without symptoms of chest pain or shortness of breath.  Anesthesia review:  Afib, aortic dilatation, HTN  Patient denies shortness of breath, fever, cough and chest pain at PAT appointment  Patient verbalized understanding of instructions that were given to them at the PAT appointment. Patient was also instructed that they will need to review over the PAT instructions again at home before surgery.

## 2023-02-07 ENCOUNTER — Other Ambulatory Visit: Payer: Self-pay

## 2023-02-07 ENCOUNTER — Encounter (HOSPITAL_COMMUNITY)
Admission: RE | Admit: 2023-02-07 | Discharge: 2023-02-07 | Disposition: A | Discharge status: Home / Self Care | Payer: Medicare Other | Source: Ambulatory Visit | Attending: Surgery | Admitting: Surgery

## 2023-02-07 ENCOUNTER — Encounter (HOSPITAL_COMMUNITY): Payer: Self-pay

## 2023-02-07 DIAGNOSIS — Z87891 Personal history of nicotine dependence: Secondary | ICD-10-CM | POA: Diagnosis not present

## 2023-02-07 DIAGNOSIS — Z933 Colostomy status: Secondary | ICD-10-CM | POA: Insufficient documentation

## 2023-02-07 DIAGNOSIS — I4891 Unspecified atrial fibrillation: Secondary | ICD-10-CM | POA: Insufficient documentation

## 2023-02-07 DIAGNOSIS — I10 Essential (primary) hypertension: Secondary | ICD-10-CM | POA: Insufficient documentation

## 2023-02-07 DIAGNOSIS — Z01812 Encounter for preprocedural laboratory examination: Secondary | ICD-10-CM | POA: Diagnosis present

## 2023-02-07 DIAGNOSIS — Z01818 Encounter for other preprocedural examination: Secondary | ICD-10-CM

## 2023-02-07 HISTORY — DX: Unspecified atrial fibrillation: I48.91

## 2023-02-07 HISTORY — DX: Diverticulitis of intestine, part unspecified, without perforation or abscess without bleeding: K57.92

## 2023-02-07 LAB — TYPE AND SCREEN
ABO/RH(D): O POS
Antibody Screen: NEGATIVE

## 2023-02-07 LAB — COMPREHENSIVE METABOLIC PANEL
ALT: 21 U/L (ref 0–44)
AST: 21 U/L (ref 15–41)
Albumin: 3.8 g/dL (ref 3.5–5.0)
Alkaline Phosphatase: 79 U/L (ref 38–126)
Anion gap: 8 (ref 5–15)
BUN: 28 mg/dL — ABNORMAL HIGH (ref 8–23)
CO2: 23 mmol/L (ref 22–32)
Calcium: 9 mg/dL (ref 8.9–10.3)
Chloride: 105 mmol/L (ref 98–111)
Creatinine, Ser: 1.22 mg/dL (ref 0.61–1.24)
GFR, Estimated: >60 mL/min (ref >60)
Glucose, Bld: 117 mg/dL — ABNORMAL HIGH (ref 70–99)
Potassium: 4 mmol/L (ref 3.5–5.1)
Sodium: 136 mmol/L (ref 135–145)
Total Bilirubin: 0.6 mg/dL (ref 0.3–1.2)
Total Protein: 7.2 g/dL (ref 6.5–8.1)

## 2023-02-07 LAB — CBC WITH DIFFERENTIAL/PLATELET
Abs Immature Granulocytes: 0.02 10*3/uL (ref 0.00–0.07)
Basophils Absolute: 0.1 10*3/uL (ref 0.0–0.1)
Basophils Relative: 1 %
Eosinophils Absolute: 0.3 10*3/uL (ref 0.0–0.5)
Eosinophils Relative: 4 %
HCT: 46.3 % (ref 39.0–52.0)
Hemoglobin: 14.9 g/dL (ref 13.0–17.0)
Immature Granulocytes: 0 %
Lymphocytes Relative: 14 %
Lymphs Abs: 1 10*3/uL (ref 0.7–4.0)
MCH: 29.3 pg (ref 26.0–34.0)
MCHC: 32.2 g/dL (ref 30.0–36.0)
MCV: 91.1 fL (ref 80.0–100.0)
Monocytes Absolute: 0.5 10*3/uL (ref 0.1–1.0)
Monocytes Relative: 8 %
Neutro Abs: 5 10*3/uL (ref 1.7–7.7)
Neutrophils Relative %: 73 %
Platelets: 273 10*3/uL (ref 150–400)
RBC: 5.08 MIL/uL (ref 4.22–5.81)
RDW: 13.7 % (ref 11.5–15.5)
WBC: 6.8 10*3/uL (ref 4.0–10.5)
nRBC: 0 % (ref 0.0–0.2)

## 2023-02-10 ENCOUNTER — Ambulatory Visit
Admission: RE | Admit: 2023-02-10 | Discharge: 2023-02-10 | Disposition: A | Discharge status: Home / Self Care | Payer: Medicare Other | Source: Ambulatory Visit | Attending: Surgery | Admitting: Surgery

## 2023-02-10 ENCOUNTER — Other Ambulatory Visit: Payer: Self-pay | Admitting: Surgery

## 2023-02-10 DIAGNOSIS — Z933 Colostomy status: Secondary | ICD-10-CM

## 2023-02-10 NOTE — Progress Notes (Signed)
Anesthesia Chart Review   Case: [id number] Date/Time: 02/19/23 0815   Procedures:      XI ROBOTIC ASSISTED COLOSTOMY REVERSAL WITH POSSIBLE LOW ANTERIOR RESECTION     LYSIS OF ADHESION     FLEXIBLE SIGMOIDOSCOPY     CYSTOSCOPY with FIREFLY INJECTION   Anesthesia type: General   Pre-op diagnosis: COLOSTOMY STATUS   Location: [facility] ROOM 02 / [facility]   Surgeons: Ileana Roup, MD; Vira Agar, MD       DISCUSSION:77 y.o. former smoker with h/o HTN, a-fibrillation, colostomy in place scheduled for above procedure 02/19/2023 with Dr. Nadeen Landau and Dr. Terrilee Files.   Per cardiology preoperative evaluation 01/06/2023, "Chart reviewed as part of pre-operative protocol coverage. Based on ACC/AHA guidelines, Nicholas Huffman was evaluated by Mayra Reel, NP in the office on 12/13/22 at which time he was deemed acceptable risk for the planned procedure without further cardiovascular testing.    Patient was advised that if he develops new symptoms prior to surgery to contact our office to arrange a follow-up appointment.     Per office protocol, patient can hold Eliquis for 3 days prior to procedure."  Anticipate pt can proceed with planned procedure barring acute status change.   VS: BP 137/79   Pulse 81   Temp 36.6 C (Oral)   Resp 20   Ht '5\' 6"'$  (1.676 m)   Wt 75.2 kg   SpO2 96%   BMI 26.76 kg/m   PROVIDERS: Susy Frizzle, MD is PCP   Kirk Ruths, MD is Cardiologist  LABS: Labs reviewed: Acceptable for surgery. (all labs ordered are listed, but only abnormal results are displayed)  Labs Reviewed  COMPREHENSIVE METABOLIC PANEL - Abnormal; Notable for the following components:      Result Value   Glucose, Bld 117 (*)    BUN 28 (*)    All other components within normal limits  CBC WITH DIFFERENTIAL/PLATELET  TYPE AND SCREEN     IMAGES:   EKG:   CV: Echo 07/19/2022 1. Left ventricular ejection fraction, by estimation, is 65 to 70%. The  left  ventricle has normal function. The left ventricle has no regional  wall motion abnormalities. Left ventricular diastolic function could not  be evaluated.   2. Right ventricular systolic function is normal. The right ventricular  size is normal. Tricuspid regurgitation signal is inadequate for assessing  PA pressure.   3. The mitral valve is normal in structure. No evidence of mitral valve  regurgitation. No evidence of mitral stenosis.   4. The aortic valve was not well visualized. Aortic valve regurgitation  is not visualized. No aortic stenosis is present.   5. Aortic dilatation noted. There is mild dilatation of the aortic root,  measuring 38 mm.  Past Medical History:  Diagnosis Date   A-fib ([place])    Cancer ([place])    basal cell   Cataract    Diverticulitis    Fractured skull ([place])    as a child   Hyperlipidemia    Hypertension    Primary localized osteoarthritis of right knee    hx of- knee has been replaced   Smoker    Wears glasses    Wears hearing aid in both ears     Past Surgical History:  Procedure Laterality Date   BASAL CELL CARCINOMA EXCISION  2016   face   CARDIOVERSION N/A 09/12/2022   Procedure: CARDIOVERSION;  Surgeon: Jerline Pain, MD;  Location: [place];  Service:  Cardiovascular;  Laterality: N/A;   CATARACT EXTRACTION W/ INTRAOCULAR LENS  IMPLANT, BILATERAL     COLONOSCOPY  2008, 2002   hx polyps/Dr. Fuller Plan   EYE SURGERY     retina detachment repair x 2   KNEE ARTHROSCOPY  April/2013   left   LAPAROTOMY N/A 07/16/2022   Procedure: EXPLORATORY LAPAROTOMY, HARTMAN'S PROCEDURE;  Surgeon: Greer Pickerel, MD;  Location: [place];  Service: General;  Laterality: N/A;   TOTAL KNEE ARTHROPLASTY Right 03/26/2018   Procedure: RIGHT TOTAL KNEE ARTHROPLASTY;  Surgeon: Leandrew Koyanagi, MD;  Location: [place];  Service: Orthopedics;  Laterality: Right;    MEDICATIONS:  apixaban (ELIQUIS) 5 MG TABS tablet   atorvastatin (LIPITOR) 40 MG tablet   diltiazem (CARDIZEM  SR) 120 MG 12 hr capsule   irbesartan (AVAPRO) 150 MG tablet   metoprolol succinate (TOPROL-XL) 25 MG 24 hr tablet   No current facility-administered medications for this encounter.    Konrad Felix Ward, PA-C [facility] Pre-Surgical Testing [phone]

## 2023-02-11 ENCOUNTER — Other Ambulatory Visit: Payer: Medicare Other

## 2023-02-14 ENCOUNTER — Other Ambulatory Visit: Payer: Self-pay | Admitting: Family Medicine

## 2023-02-18 NOTE — Anesthesia Preprocedure Evaluation (Signed)
Anesthesia Evaluation  Patient identified by MRN, date of birth, ID band Patient awake    Reviewed: Allergy & Precautions, H&P , NPO status , Patient's Chart, lab work & pertinent test results  Airway Mallampati: II  TM Distance: >3 FB Neck ROM: full    Dental no notable dental hx. (+) Teeth Intact, Dental Advisory Given   Pulmonary former smoker   Pulmonary exam normal breath sounds clear to auscultation       Cardiovascular hypertension, Pt. on medications and Pt. on home beta blockers Normal cardiovascular exam+ dysrhythmias Atrial Fibrillation  Rhythm:irregular Rate:Normal  Echo 07/19/2022 1. Left ventricular ejection fraction, by estimation, is 65 to 70%. The  left ventricle has normal function. The left ventricle has no regional  wall motion abnormalities. Left ventricular diastolic function could not  be evaluated.   2. Right ventricular systolic function is normal. The right ventricular  size is normal. Tricuspid regurgitation signal is inadequate for assessing  PA pressure.   3. The mitral valve is normal in structure. No evidence of mitral valve  regurgitation. No evidence of mitral stenosis.   4. The aortic valve was not well visualized. Aortic valve regurgitation  is not visualized. No aortic stenosis is present.   5. Aortic dilatation noted. There is mild dilatation of the aortic root,  measuring 38 mm.     Neuro/Psych negative neurological ROS  negative psych ROS   GI/Hepatic negative GI ROS, Neg liver ROS,,,  Endo/Other  negative endocrine ROS    Renal/GU negative Renal ROS  negative genitourinary   Musculoskeletal negative musculoskeletal ROS (+) Arthritis ,    Abdominal   Peds negative pediatric ROS (+)  Hematology negative hematology ROS (+)   Anesthesia Other Findings   Reproductive/Obstetrics negative OB ROS                             Anesthesia  Physical Anesthesia Plan  ASA: 3  Anesthesia Plan: General   Post-op Pain Management: Tylenol PO (pre-op)* and Celebrex PO (pre-op)*   Induction: Intravenous, Cricoid pressure planned and Rapid sequence  PONV Risk Score and Plan: 2 and Treatment may vary due to age or medical condition, Ondansetron and Dexamethasone  Airway Management Planned: Oral ETT  Additional Equipment: None  Intra-op Plan:   Post-operative Plan: Extubation in OR  Informed Consent: I have reviewed the patients History and Physical, chart, labs and discussed the procedure including the risks, benefits and alternatives for the proposed anesthesia with the patient or authorized representative who has indicated his/her understanding and acceptance.     Dental advisory given  Plan Discussed with: CRNA, Anesthesiologist and Surgeon  Anesthesia Plan Comments: (DISCUSSION:77 y.o. former smoker with h/o HTN, a-fibrillation, colostomy in place scheduled for above procedure 02/19/2023 with Dr. Nadeen Landau and Dr. Terrilee Files.    Per cardiology preoperative evaluation 01/06/2023, "Chart reviewed as part of pre-operative protocol coverage. Based on ACC/AHA guidelines, Nicholas Huffman was evaluated by Mayra Reel, NP in the office on 12/13/22 at which time he was deemed acceptable risk for the planned procedure without further cardiovascular testing.    Patient was advised that if he develops new symptoms prior to surgery to contact our office to arrange a follow-up appointment.     Per office protocol, patient can hold Eliquis for 3 days prior to procedure." )        Anesthesia Quick Evaluation

## 2023-02-19 ENCOUNTER — Encounter (HOSPITAL_COMMUNITY)
Admission: RE | Disposition: A | Discharge status: Home / Self Care | Payer: Self-pay | Source: Home / Self Care | Attending: Surgery

## 2023-02-19 ENCOUNTER — Inpatient Hospital Stay (HOSPITAL_COMMUNITY): Payer: Medicare Other | Admitting: Anesthesiology

## 2023-02-19 ENCOUNTER — Other Ambulatory Visit: Payer: Self-pay

## 2023-02-19 ENCOUNTER — Inpatient Hospital Stay (HOSPITAL_COMMUNITY): Payer: Medicare Other | Admitting: Physician Assistant

## 2023-02-19 ENCOUNTER — Inpatient Hospital Stay (HOSPITAL_COMMUNITY)
Admission: RE | Admit: 2023-02-19 | Discharge: 2023-02-24 | DRG: 330 | Disposition: A | Discharge status: Home / Self Care | Payer: Medicare Other | Attending: Surgery | Admitting: Surgery

## 2023-02-19 ENCOUNTER — Encounter (HOSPITAL_COMMUNITY): Payer: Self-pay | Admitting: Surgery

## 2023-02-19 DIAGNOSIS — Z974 Presence of external hearing-aid: Secondary | ICD-10-CM

## 2023-02-19 DIAGNOSIS — I480 Paroxysmal atrial fibrillation: Secondary | ICD-10-CM | POA: Diagnosis present

## 2023-02-19 DIAGNOSIS — E871 Hypo-osmolality and hyponatremia: Secondary | ICD-10-CM | POA: Diagnosis present

## 2023-02-19 DIAGNOSIS — Z433 Encounter for attention to colostomy: Secondary | ICD-10-CM | POA: Diagnosis present

## 2023-02-19 DIAGNOSIS — M1990 Unspecified osteoarthritis, unspecified site: Secondary | ICD-10-CM | POA: Diagnosis present

## 2023-02-19 DIAGNOSIS — Z87891 Personal history of nicotine dependence: Secondary | ICD-10-CM

## 2023-02-19 DIAGNOSIS — D6832 Hemorrhagic disorder due to extrinsic circulating anticoagulants: Secondary | ICD-10-CM | POA: Diagnosis not present

## 2023-02-19 DIAGNOSIS — I4891 Unspecified atrial fibrillation: Secondary | ICD-10-CM | POA: Diagnosis present

## 2023-02-19 DIAGNOSIS — K660 Peritoneal adhesions (postprocedural) (postinfection): Secondary | ICD-10-CM | POA: Diagnosis present

## 2023-02-19 DIAGNOSIS — K635 Polyp of colon: Secondary | ICD-10-CM | POA: Diagnosis present

## 2023-02-19 DIAGNOSIS — Z803 Family history of malignant neoplasm of breast: Secondary | ICD-10-CM

## 2023-02-19 DIAGNOSIS — Z96651 Presence of right artificial knee joint: Secondary | ICD-10-CM | POA: Diagnosis present

## 2023-02-19 DIAGNOSIS — I10 Essential (primary) hypertension: Secondary | ICD-10-CM | POA: Diagnosis present

## 2023-02-19 DIAGNOSIS — K648 Other hemorrhoids: Secondary | ICD-10-CM | POA: Diagnosis present

## 2023-02-19 DIAGNOSIS — H9193 Unspecified hearing loss, bilateral: Secondary | ICD-10-CM | POA: Diagnosis present

## 2023-02-19 DIAGNOSIS — T45515A Adverse effect of anticoagulants, initial encounter: Secondary | ICD-10-CM | POA: Diagnosis not present

## 2023-02-19 DIAGNOSIS — Z85828 Personal history of other malignant neoplasm of skin: Secondary | ICD-10-CM

## 2023-02-19 DIAGNOSIS — E785 Hyperlipidemia, unspecified: Secondary | ICD-10-CM | POA: Diagnosis present

## 2023-02-19 DIAGNOSIS — Z809 Family history of malignant neoplasm, unspecified: Secondary | ICD-10-CM | POA: Diagnosis not present

## 2023-02-19 DIAGNOSIS — K5730 Diverticulosis of large intestine without perforation or abscess without bleeding: Secondary | ICD-10-CM | POA: Diagnosis present

## 2023-02-19 DIAGNOSIS — K625 Hemorrhage of anus and rectum: Secondary | ICD-10-CM | POA: Diagnosis not present

## 2023-02-19 DIAGNOSIS — Z9889 Other specified postprocedural states: Principal | ICD-10-CM

## 2023-02-19 HISTORY — PX: LYSIS OF ADHESION: SHX5961

## 2023-02-19 HISTORY — PX: XI ROBOTIC ASSISTED COLOSTOMY TAKEDOWN: SHX6828

## 2023-02-19 HISTORY — PX: FLEXIBLE SIGMOIDOSCOPY: SHX5431

## 2023-02-19 SURGERY — CLOSURE, COLOSTOMY, ROBOT-ASSISTED
Anesthesia: General

## 2023-02-19 MED ORDER — SODIUM CHLORIDE 0.9 % IV SOLN
2.0000 g | INTRAVENOUS | Status: AC
  Administered 2023-02-19: 2 g via INTRAVENOUS
  Filled 2023-02-19: qty 2

## 2023-02-19 MED ORDER — KETAMINE HCL 10 MG/ML IJ SOLN
INTRAMUSCULAR | Status: DC | PRN
  Administered 2023-02-19: 30 mg via INTRAVENOUS
  Administered 2023-02-19: 20 mg via INTRAVENOUS

## 2023-02-19 MED ORDER — TRAMADOL HCL 50 MG PO TABS: 50.0000 mg | ORAL_TABLET | Freq: Four times a day (QID) | ORAL | Status: DC | PRN

## 2023-02-19 MED ORDER — ROCURONIUM BROMIDE 10 MG/ML (PF) SYRINGE
PREFILLED_SYRINGE | INTRAVENOUS | Status: DC | PRN
  Administered 2023-02-19: 90 mg via INTRAVENOUS
  Administered 2023-02-19: 10 mg via INTRAVENOUS

## 2023-02-19 MED ORDER — ONDANSETRON HCL 4 MG/2ML IJ SOLN: 4.0000 mg | Freq: Four times a day (QID) | INTRAMUSCULAR | Status: DC | PRN

## 2023-02-19 MED ORDER — HYDRALAZINE HCL 20 MG/ML IJ SOLN: 10.0000 mg | INTRAMUSCULAR | Status: DC | PRN

## 2023-02-19 MED ORDER — LACTATED RINGERS IV SOLN: INTRAVENOUS | Status: DC

## 2023-02-19 MED ORDER — SUCCINYLCHOLINE CHLORIDE 200 MG/10ML IV SOSY
PREFILLED_SYRINGE | INTRAVENOUS | Status: AC
  Filled 2023-02-19: qty 10

## 2023-02-19 MED ORDER — METRONIDAZOLE 500 MG PO TABS: 1000.0000 mg | ORAL_TABLET | ORAL | Status: DC

## 2023-02-19 MED ORDER — IRBESARTAN 150 MG PO TABS
150.0000 mg | ORAL_TABLET | Freq: Every day | ORAL | Status: DC
  Administered 2023-02-20 – 2023-02-24 (×5): 150 mg via ORAL
  Filled 2023-02-19 (×5): qty 1

## 2023-02-19 MED ORDER — ALVIMOPAN 12 MG PO CAPS
12.0000 mg | ORAL_CAPSULE | Freq: Two times a day (BID) | ORAL | Status: DC
  Administered 2023-02-20: 12 mg via ORAL
  Filled 2023-02-19: qty 1

## 2023-02-19 MED ORDER — MEPERIDINE HCL 50 MG/ML IJ SOLN: 6.2500 mg | INTRAMUSCULAR | Status: DC | PRN

## 2023-02-19 MED ORDER — FENTANYL CITRATE PF 50 MCG/ML IJ SOSY: 25.0000 ug | PREFILLED_SYRINGE | INTRAMUSCULAR | Status: DC | PRN

## 2023-02-19 MED ORDER — DEXAMETHASONE SODIUM PHOSPHATE 10 MG/ML IJ SOLN
INTRAMUSCULAR | Status: AC
  Filled 2023-02-19: qty 1

## 2023-02-19 MED ORDER — LACTATED RINGERS IV SOLN: INTRAVENOUS | Status: DC | PRN

## 2023-02-19 MED ORDER — STERILE WATER FOR INJECTION IJ SOLN
INTRAMUSCULAR | Status: AC
  Filled 2023-02-19: qty 10

## 2023-02-19 MED ORDER — LIDOCAINE 2% (20 MG/ML) 5 ML SYRINGE
INTRAMUSCULAR | Status: DC | PRN
  Administered 2023-02-19: 100 mg via INTRAVENOUS

## 2023-02-19 MED ORDER — FENTANYL CITRATE (PF) 100 MCG/2ML IJ SOLN
INTRAMUSCULAR | Status: AC
  Filled 2023-02-19: qty 2

## 2023-02-19 MED ORDER — ALUM & MAG HYDROXIDE-SIMETH 200-200-20 MG/5ML PO SUSP: 30.0000 mL | Freq: Four times a day (QID) | ORAL | Status: DC | PRN

## 2023-02-19 MED ORDER — LIDOCAINE HCL 2 % IJ SOLN
INTRAMUSCULAR | Status: AC
  Filled 2023-02-19: qty 20

## 2023-02-19 MED ORDER — POLYETHYLENE GLYCOL 3350 17 GM/SCOOP PO POWD: 1.0000 | Freq: Once | ORAL | Status: DC

## 2023-02-19 MED ORDER — BISACODYL 5 MG PO TBEC: 20.0000 mg | DELAYED_RELEASE_TABLET | Freq: Once | ORAL | Status: DC

## 2023-02-19 MED ORDER — ACETAMINOPHEN 160 MG/5ML PO SOLN: 325.0000 mg | ORAL | Status: DC | PRN

## 2023-02-19 MED ORDER — PHENYLEPHRINE HCL-NACL 20-0.9 MG/250ML-% IV SOLN
INTRAVENOUS | Status: DC | PRN
  Administered 2023-02-19: 50 ug/min via INTRAVENOUS

## 2023-02-19 MED ORDER — OXYCODONE HCL 5 MG PO TABS: 5.0000 mg | ORAL_TABLET | Freq: Once | ORAL | Status: DC | PRN

## 2023-02-19 MED ORDER — ENSURE PRE-SURGERY PO LIQD: 296.0000 mL | Freq: Once | ORAL | Status: DC

## 2023-02-19 MED ORDER — PHENYLEPHRINE 80 MCG/ML (10ML) SYRINGE FOR IV PUSH (FOR BLOOD PRESSURE SUPPORT)
PREFILLED_SYRINGE | INTRAVENOUS | Status: DC | PRN
  Administered 2023-02-19 (×2): 160 ug via INTRAVENOUS
  Administered 2023-02-19 (×2): 240 ug via INTRAVENOUS

## 2023-02-19 MED ORDER — FENTANYL CITRATE (PF) 100 MCG/2ML IJ SOLN
INTRAMUSCULAR | Status: DC | PRN
  Administered 2023-02-19: 100 ug via INTRAVENOUS
  Administered 2023-02-19 (×2): 50 ug via INTRAVENOUS
  Administered 2023-02-19: 100 ug via INTRAVENOUS

## 2023-02-19 MED ORDER — ACETAMINOPHEN 325 MG PO TABS: 325.0000 mg | ORAL_TABLET | ORAL | Status: DC | PRN

## 2023-02-19 MED ORDER — BUPIVACAINE LIPOSOME 1.3 % IJ SUSP
INTRAMUSCULAR | Status: AC
  Filled 2023-02-19: qty 20

## 2023-02-19 MED ORDER — ACETAMINOPHEN 500 MG PO TABS
1000.0000 mg | ORAL_TABLET | Freq: Four times a day (QID) | ORAL | Status: DC
  Administered 2023-02-19 – 2023-02-22 (×8): 1000 mg via ORAL
  Filled 2023-02-19 (×9): qty 2

## 2023-02-19 MED ORDER — ONDANSETRON HCL 4 MG/2ML IJ SOLN
INTRAMUSCULAR | Status: AC
  Filled 2023-02-19: qty 2

## 2023-02-19 MED ORDER — SUCCINYLCHOLINE CHLORIDE 200 MG/10ML IV SOSY
PREFILLED_SYRINGE | INTRAVENOUS | Status: DC | PRN
  Administered 2023-02-19: 120 mg via INTRAVENOUS

## 2023-02-19 MED ORDER — IBUPROFEN 400 MG PO TABS: 600.0000 mg | ORAL_TABLET | Freq: Four times a day (QID) | ORAL | Status: DC | PRN

## 2023-02-19 MED ORDER — ALVIMOPAN 12 MG PO CAPS
12.0000 mg | ORAL_CAPSULE | ORAL | Status: AC
  Administered 2023-02-19: 12 mg via ORAL
  Filled 2023-02-19: qty 1

## 2023-02-19 MED ORDER — ONDANSETRON HCL 4 MG/2ML IJ SOLN
INTRAMUSCULAR | Status: DC | PRN
  Administered 2023-02-19: 4 mg via INTRAVENOUS

## 2023-02-19 MED ORDER — DIPHENHYDRAMINE HCL 50 MG/ML IJ SOLN: 12.5000 mg | Freq: Four times a day (QID) | INTRAMUSCULAR | Status: DC | PRN

## 2023-02-19 MED ORDER — EPHEDRINE 5 MG/ML INJ
INTRAVENOUS | Status: AC
  Filled 2023-02-19: qty 5

## 2023-02-19 MED ORDER — KETAMINE HCL 10 MG/ML IJ SOLN
INTRAMUSCULAR | Status: AC
  Filled 2023-02-19: qty 1

## 2023-02-19 MED ORDER — ORAL CARE MOUTH RINSE: 15.0000 mL | Freq: Once | OROMUCOSAL | Status: AC

## 2023-02-19 MED ORDER — ROCURONIUM BROMIDE 10 MG/ML (PF) SYRINGE
PREFILLED_SYRINGE | INTRAVENOUS | Status: AC
  Filled 2023-02-19: qty 10

## 2023-02-19 MED ORDER — ONDANSETRON HCL 4 MG PO TABS: 4.0000 mg | ORAL_TABLET | Freq: Four times a day (QID) | ORAL | Status: DC | PRN

## 2023-02-19 MED ORDER — CHLORHEXIDINE GLUCONATE CLOTH 2 % EX PADS: 6.0000 | MEDICATED_PAD | Freq: Once | CUTANEOUS | Status: DC

## 2023-02-19 MED ORDER — SODIUM CHLORIDE (PF) 0.9 % IJ SOLN
INTRAMUSCULAR | Status: AC
  Filled 2023-02-19: qty 20

## 2023-02-19 MED ORDER — 0.9 % SODIUM CHLORIDE (POUR BTL) OPTIME
TOPICAL | Status: DC | PRN
  Administered 2023-02-19: 2000 mL

## 2023-02-19 MED ORDER — DILTIAZEM HCL ER 90 MG PO CP12
240.0000 mg | ORAL_CAPSULE | Freq: Every day | ORAL | Status: DC
  Administered 2023-02-20 – 2023-02-24 (×5): 240 mg via ORAL
  Filled 2023-02-19 (×5): qty 1

## 2023-02-19 MED ORDER — HYDROMORPHONE HCL 1 MG/ML IJ SOLN: 0.5000 mg | INTRAMUSCULAR | Status: DC | PRN

## 2023-02-19 MED ORDER — ENSURE SURGERY PO LIQD
237.0000 mL | Freq: Two times a day (BID) | ORAL | Status: DC
  Administered 2023-02-20: 237 mL via ORAL

## 2023-02-19 MED ORDER — OXYCODONE HCL 5 MG/5ML PO SOLN: 5.0000 mg | Freq: Once | ORAL | Status: DC | PRN

## 2023-02-19 MED ORDER — CHLORHEXIDINE GLUCONATE 0.12 % MT SOLN
15.0000 mL | Freq: Once | OROMUCOSAL | Status: AC
  Administered 2023-02-19: 15 mL via OROMUCOSAL

## 2023-02-19 MED ORDER — BUPIVACAINE LIPOSOME 1.3 % IJ SUSP: 20.0000 mL | Freq: Once | INTRAMUSCULAR | Status: DC

## 2023-02-19 MED ORDER — SPY AGENT GREEN - (INDOCYANINE FOR INJECTION)
INTRAMUSCULAR | Status: DC | PRN
  Administered 2023-02-19: 1 mL via INTRAVENOUS

## 2023-02-19 MED ORDER — EPHEDRINE SULFATE-NACL 50-0.9 MG/10ML-% IV SOSY
PREFILLED_SYRINGE | INTRAVENOUS | Status: DC | PRN
  Administered 2023-02-19 (×2): 10 mg via INTRAVENOUS

## 2023-02-19 MED ORDER — DIPHENHYDRAMINE HCL 12.5 MG/5ML PO ELIX: 12.5000 mg | ORAL_SOLUTION | Freq: Four times a day (QID) | ORAL | Status: DC | PRN

## 2023-02-19 MED ORDER — HEPARIN SODIUM (PORCINE) 5000 UNIT/ML IJ SOLN
5000.0000 [IU] | Freq: Three times a day (TID) | INTRAMUSCULAR | Status: DC
  Administered 2023-02-19 – 2023-02-21 (×5): 5000 [IU] via SUBCUTANEOUS
  Filled 2023-02-19 (×5): qty 1

## 2023-02-19 MED ORDER — SIMETHICONE 80 MG PO CHEW: 40.0000 mg | CHEWABLE_TABLET | Freq: Four times a day (QID) | ORAL | Status: DC | PRN

## 2023-02-19 MED ORDER — ENSURE PRE-SURGERY PO LIQD: 592.0000 mL | Freq: Once | ORAL | Status: DC

## 2023-02-19 MED ORDER — BUPIVACAINE-EPINEPHRINE (PF) 0.25% -1:200000 IJ SOLN
INTRAMUSCULAR | Status: DC | PRN
  Administered 2023-02-19: 30 mL

## 2023-02-19 MED ORDER — ONDANSETRON HCL 4 MG/2ML IJ SOLN: 4.0000 mg | Freq: Once | INTRAMUSCULAR | Status: DC | PRN

## 2023-02-19 MED ORDER — PROPOFOL 10 MG/ML IV BOLUS
INTRAVENOUS | Status: AC
  Filled 2023-02-19: qty 20

## 2023-02-19 MED ORDER — DEXAMETHASONE SODIUM PHOSPHATE 10 MG/ML IJ SOLN
INTRAMUSCULAR | Status: DC | PRN
  Administered 2023-02-19: 10 mg via INTRAVENOUS

## 2023-02-19 MED ORDER — METOPROLOL SUCCINATE ER 25 MG PO TB24
25.0000 mg | ORAL_TABLET | Freq: Every day | ORAL | Status: DC
  Administered 2023-02-20 – 2023-02-24 (×5): 25 mg via ORAL
  Filled 2023-02-19 (×5): qty 1

## 2023-02-19 MED ORDER — STERILE WATER FOR INJECTION IJ SOLN
INTRAMUSCULAR | Status: DC | PRN
  Administered 2023-02-19: 15 mL via INTRAMUSCULAR

## 2023-02-19 MED ORDER — ATORVASTATIN CALCIUM 20 MG PO TABS
40.0000 mg | ORAL_TABLET | Freq: Every day | ORAL | Status: DC
  Administered 2023-02-20 – 2023-02-23 (×4): 40 mg via ORAL
  Filled 2023-02-19 (×4): qty 2

## 2023-02-19 MED ORDER — PHENYLEPHRINE HCL (PRESSORS) 10 MG/ML IV SOLN
INTRAVENOUS | Status: AC
  Filled 2023-02-19: qty 1

## 2023-02-19 MED ORDER — LIDOCAINE HCL (PF) 2 % IJ SOLN
INTRAMUSCULAR | Status: DC | PRN
  Administered 2023-02-19: 1.5 mg/kg/h via INTRADERMAL

## 2023-02-19 MED ORDER — PROPOFOL 10 MG/ML IV BOLUS
INTRAVENOUS | Status: DC | PRN
  Administered 2023-02-19: 200 mg via INTRAVENOUS

## 2023-02-19 MED ORDER — HEPARIN SODIUM (PORCINE) 5000 UNIT/ML IJ SOLN
5000.0000 [IU] | Freq: Once | INTRAMUSCULAR | Status: AC
  Administered 2023-02-19: 5000 [IU] via SUBCUTANEOUS
  Filled 2023-02-19: qty 1

## 2023-02-19 MED ORDER — SUGAMMADEX SODIUM 200 MG/2ML IV SOLN
INTRAVENOUS | Status: DC | PRN
  Administered 2023-02-19: 200 mg via INTRAVENOUS

## 2023-02-19 MED ORDER — FENTANYL CITRATE PF 50 MCG/ML IJ SOSY
PREFILLED_SYRINGE | INTRAMUSCULAR | Status: AC
  Filled 2023-02-19: qty 2

## 2023-02-19 MED ORDER — DILTIAZEM HCL ER 90 MG PO CP12: 240.0000 mg | ORAL_CAPSULE | Freq: Every day | ORAL | Status: DC

## 2023-02-19 MED ORDER — NEOMYCIN SULFATE 500 MG PO TABS: 1000.0000 mg | ORAL_TABLET | ORAL | Status: DC

## 2023-02-19 MED ORDER — BUPIVACAINE LIPOSOME 1.3 % IJ SUSP
INTRAMUSCULAR | Status: DC | PRN
  Administered 2023-02-19: 20 mL

## 2023-02-19 MED ORDER — PHENYLEPHRINE 80 MCG/ML (10ML) SYRINGE FOR IV PUSH (FOR BLOOD PRESSURE SUPPORT)
PREFILLED_SYRINGE | INTRAVENOUS | Status: AC
  Filled 2023-02-19: qty 10

## 2023-02-19 MED ORDER — SODIUM CHLORIDE 0.9 % IR SOLN
Status: DC | PRN
  Administered 2023-02-19: 1000 mL

## 2023-02-19 MED ORDER — LIDOCAINE HCL (PF) 2 % IJ SOLN
INTRAMUSCULAR | Status: AC
  Filled 2023-02-19: qty 5

## 2023-02-19 MED ORDER — ACETAMINOPHEN 500 MG PO TABS
1000.0000 mg | ORAL_TABLET | ORAL | Status: AC
  Administered 2023-02-19: 1000 mg via ORAL
  Filled 2023-02-19: qty 2

## 2023-02-19 MED ORDER — BUPIVACAINE-EPINEPHRINE (PF) 0.25% -1:200000 IJ SOLN
INTRAMUSCULAR | Status: AC
  Filled 2023-02-19: qty 30

## 2023-02-19 SURGICAL SUPPLY — 142 items
ADAPTER GOLDBERG URETERAL (ADAPTER) IMPLANT
ADPR CATH 15X14FR FL DRN BG (ADAPTER)
APL PRP STRL LF DISP 70% ISPRP (MISCELLANEOUS)
APPLIER CLIP 5 13 M/L LIGAMAX5 (MISCELLANEOUS)
APPLIER CLIP ROT 10 11.4 M/L (STAPLE)
APR CLP MED LRG 11.4X10 (STAPLE)
APR CLP MED LRG 5 ANG JAW (MISCELLANEOUS)
BAG COUNTER SPONGE SURGICOUNT (BAG) IMPLANT
BAG SPNG CNTER NS LX DISP (BAG)
BAG URO CATCHER STRL LF (MISCELLANEOUS) ×1 IMPLANT
BLADE EXTENDED COATED 6.5IN (ELECTRODE) ×1 IMPLANT
CANNULA REDUC XI 12-8 STAPL (CANNULA) ×1
CANNULA REDUCER 12-8 DVNC XI (CANNULA) ×1 IMPLANT
CATH URETL OPEN 5X70 (CATHETERS) IMPLANT
CELLS DAT CNTRL 66122 CELL SVR (MISCELLANEOUS) IMPLANT
CHLORAPREP W/TINT 26 (MISCELLANEOUS) ×1 IMPLANT
CLIP APPLIE 5 13 M/L LIGAMAX5 (MISCELLANEOUS) IMPLANT
CLIP APPLIE ROT 10 11.4 M/L (STAPLE) IMPLANT
CLIP LIGATING HEM O LOK PURPLE (MISCELLANEOUS) IMPLANT
CLIP LIGATING HEMO O LOK GREEN (MISCELLANEOUS) IMPLANT
CLOTH BEACON ORANGE TIMEOUT ST (SAFETY) ×1 IMPLANT
COVER SURGICAL LIGHT HANDLE (MISCELLANEOUS) ×2 IMPLANT
COVER TIP SHEARS 8 DVNC (MISCELLANEOUS) ×1 IMPLANT
COVER TIP SHEARS 8MM DA VINCI (MISCELLANEOUS) ×1
DEFOGGER SCOPE WARMER CLEARIFY (MISCELLANEOUS) ×1 IMPLANT
DEVICE TROCAR PUNCTURE CLOSURE (ENDOMECHANICALS) IMPLANT
DRAIN CHANNEL 19F RND (DRAIN) ×1 IMPLANT
DRAPE ARM DVNC X/XI (DISPOSABLE) ×4 IMPLANT
DRAPE COLUMN DVNC XI (DISPOSABLE) ×1 IMPLANT
DRAPE DA VINCI XI ARM (DISPOSABLE) ×4
DRAPE DA VINCI XI COLUMN (DISPOSABLE) ×1
DRAPE SURG IRRIG POUCH 19X23 (DRAPES) ×1 IMPLANT
DRSG OPSITE POSTOP 4X10 (GAUZE/BANDAGES/DRESSINGS) IMPLANT
DRSG OPSITE POSTOP 4X6 (GAUZE/BANDAGES/DRESSINGS) IMPLANT
DRSG OPSITE POSTOP 4X8 (GAUZE/BANDAGES/DRESSINGS) IMPLANT
DRSG TEGADERM 2-3/8X2-3/4 SM (GAUZE/BANDAGES/DRESSINGS) ×5 IMPLANT
DRSG TEGADERM 4X4.75 (GAUZE/BANDAGES/DRESSINGS) ×1 IMPLANT
ELECT REM PT RETURN 15FT ADLT (MISCELLANEOUS) ×1 IMPLANT
ENDOLOOP SUT PDS II  0 18 (SUTURE)
ENDOLOOP SUT PDS II 0 18 (SUTURE) IMPLANT
EVACUATOR SILICONE 100CC (DRAIN) ×1 IMPLANT
GAUZE SPONGE 2X2 8PLY STRL LF (GAUZE/BANDAGES/DRESSINGS) ×1 IMPLANT
GAUZE SPONGE 4X4 12PLY STRL (GAUZE/BANDAGES/DRESSINGS) IMPLANT
GLOVE BIO SURGEON STRL SZ7.5 (GLOVE) ×3 IMPLANT
GLOVE INDICATOR 8.0 STRL GRN (GLOVE) ×3 IMPLANT
GLOVE SURG LX STRL 7.5 STRW (GLOVE) ×1 IMPLANT
GOWN SRG XL LVL 4 BRTHBL STRL (GOWNS) ×1 IMPLANT
GOWN STRL NON-REIN XL LVL4 (GOWNS) ×1
GOWN STRL REUS W/ TWL XL LVL3 (GOWN DISPOSABLE) ×6 IMPLANT
GOWN STRL REUS W/TWL XL LVL3 (GOWN DISPOSABLE) ×6
GRASPER SUT TROCAR 14GX15 (MISCELLANEOUS) IMPLANT
GUIDEWIRE ANG ZIPWIRE 038X150 (WIRE) IMPLANT
GUIDEWIRE STR DUAL SENSOR (WIRE) IMPLANT
HOLDER FOLEY CATH W/STRAP (MISCELLANEOUS) ×1 IMPLANT
IRRIG SUCT STRYKERFLOW 2 WTIP (MISCELLANEOUS) ×1
IRRIGATION SUCT STRKRFLW 2 WTP (MISCELLANEOUS) ×1 IMPLANT
KIT PROCEDURE DA VINCI SI (MISCELLANEOUS) ×1
KIT PROCEDURE DVNC SI (MISCELLANEOUS) ×1 IMPLANT
KIT TURNOVER KIT A (KITS) IMPLANT
MANIFOLD NEPTUNE II (INSTRUMENTS) ×1 IMPLANT
NDL INSUFFLATION 14GA 120MM (NEEDLE) ×1 IMPLANT
NEEDLE INSUFFLATION 14GA 120MM (NEEDLE) ×1 IMPLANT
PACK CARDIOVASCULAR III (CUSTOM PROCEDURE TRAY) ×1 IMPLANT
PACK COLON (CUSTOM PROCEDURE TRAY) ×1 IMPLANT
PACK CYSTO (CUSTOM PROCEDURE TRAY) ×1 IMPLANT
PAD POSITIONING PINK XL (MISCELLANEOUS) ×1 IMPLANT
PENCIL SMOKE EVACUATOR (MISCELLANEOUS) IMPLANT
PROTECTOR NERVE ULNAR (MISCELLANEOUS) ×2 IMPLANT
RELOAD STAPLE 45 3.5 BLU DVNC (STAPLE) IMPLANT
RELOAD STAPLE 45 4.3 GRN DVNC (STAPLE) IMPLANT
RELOAD STAPLE 60 3.5 BLU DVNC (STAPLE) IMPLANT
RELOAD STAPLE 60 3.6 BLU REG (STAPLE) IMPLANT
RELOAD STAPLE 60 4.1 GRN THCK (STAPLE) IMPLANT
RELOAD STAPLE 60 4.3 GRN DVNC (STAPLE) IMPLANT
RELOAD STAPLER 3.5X45 BLU DVNC (STAPLE) IMPLANT
RELOAD STAPLER 3.5X60 BLU DVNC (STAPLE) IMPLANT
RELOAD STAPLER 4.3X45 GRN DVNC (STAPLE) IMPLANT
RELOAD STAPLER 4.3X60 GRN DVNC (STAPLE) IMPLANT
RELOAD STAPLER BLUE 60MM (STAPLE) ×4 IMPLANT
RELOAD STAPLER GREEN 60MM (STAPLE) ×1 IMPLANT
RETRACTOR WND ALEXIS 18 MED (MISCELLANEOUS) IMPLANT
RTRCTR WOUND ALEXIS 18CM MED (MISCELLANEOUS)
SCISSORS LAP 5X35 DISP (ENDOMECHANICALS) IMPLANT
SEAL CANN UNIV 5-8 DVNC XI (MISCELLANEOUS) ×4 IMPLANT
SEAL UNIV 5-12 XI (MISCELLANEOUS) IMPLANT
SEAL XI 5MM-8MM UNIVERSAL (MISCELLANEOUS)
SEAL XI UNIVERSAL 5-12 (MISCELLANEOUS) ×5
SEALER VESSEL DA VINCI XI (MISCELLANEOUS) ×1
SEALER VESSEL EXT DVNC XI (MISCELLANEOUS) ×1 IMPLANT
SLEEVE ADV FIXATION 5X100MM (TROCAR) IMPLANT
SOL ELECTROSURG ANTI STICK (MISCELLANEOUS) ×1
SOLUTION ELECTROSURG ANTI STCK (MISCELLANEOUS) ×1 IMPLANT
SPIKE FLUID TRANSFER (MISCELLANEOUS) ×1 IMPLANT
STAPLE ECHEON FLEX 60 POW ENDO (STAPLE) IMPLANT
STAPLER 60 DA VINCI SURE FORM (STAPLE)
STAPLER 60 SUREFORM DVNC (STAPLE) IMPLANT
STAPLER CANNULA SEAL DVNC XI (STAPLE) ×1 IMPLANT
STAPLER CANNULA SEAL XI (STAPLE)
STAPLER ECHELON POWER CIR 29 (STAPLE) IMPLANT
STAPLER ECHELON POWER CIR 31 (STAPLE) IMPLANT
STAPLER RELOAD 3.5X45 BLU DVNC (STAPLE)
STAPLER RELOAD 3.5X45 BLUE (STAPLE)
STAPLER RELOAD 3.5X60 BLU DVNC (STAPLE)
STAPLER RELOAD 3.5X60 BLUE (STAPLE)
STAPLER RELOAD 4.3X45 GREEN (STAPLE)
STAPLER RELOAD 4.3X45 GRN DVNC (STAPLE)
STAPLER RELOAD 4.3X60 GREEN (STAPLE)
STAPLER RELOAD 4.3X60 GRN DVNC (STAPLE)
STAPLER RELOAD BLUE 60MM (STAPLE) ×4
STAPLER RELOAD GREEN 60MM (STAPLE) ×1
STOPCOCK 4 WAY LG BORE MALE ST (IV SETS) ×2 IMPLANT
SURGILUBE 2OZ TUBE FLIPTOP (MISCELLANEOUS) ×1 IMPLANT
SUT MNCRL AB 4-0 PS2 18 (SUTURE) ×1 IMPLANT
SUT PDS AB 1 CT1 27 (SUTURE) IMPLANT
SUT PDS AB 1 TP1 96 (SUTURE) IMPLANT
SUT PROLENE 0 CT 2 (SUTURE) IMPLANT
SUT PROLENE 2 0 KS (SUTURE) ×1 IMPLANT
SUT PROLENE 2 0 SH DA (SUTURE) IMPLANT
SUT SILK 2 0 (SUTURE)
SUT SILK 2 0 SH CR/8 (SUTURE) IMPLANT
SUT SILK 2-0 18XBRD TIE 12 (SUTURE) IMPLANT
SUT SILK 3 0 (SUTURE) ×1
SUT SILK 3 0 SH CR/8 (SUTURE) ×1 IMPLANT
SUT SILK 3-0 18XBRD TIE 12 (SUTURE) ×1 IMPLANT
SUT V-LOC BARB 180 2/0GR6 GS22 (SUTURE)
SUT VIC AB 3-0 SH 18 (SUTURE) IMPLANT
SUT VIC AB 3-0 SH 27 (SUTURE)
SUT VIC AB 3-0 SH 27XBRD (SUTURE) IMPLANT
SUT VICRYL 0 UR6 27IN ABS (SUTURE) ×1 IMPLANT
SUTURE V-LC BRB 180 2/0GR6GS22 (SUTURE) IMPLANT
SYR 10ML LL (SYRINGE) ×1 IMPLANT
SYS LAPSCP GELPORT 120MM (MISCELLANEOUS)
SYS WOUND ALEXIS 18CM MED (MISCELLANEOUS) ×1
SYSTEM LAPSCP GELPORT 120MM (MISCELLANEOUS) IMPLANT
SYSTEM WOUND ALEXIS 18CM MED (MISCELLANEOUS) ×1 IMPLANT
TAPE UMBILICAL 1/8 X36 TWILL (MISCELLANEOUS) ×1 IMPLANT
TOWEL OR NON WOVEN STRL DISP B (DISPOSABLE) ×1 IMPLANT
TRAY FOLEY MTR SLVR 16FR STAT (SET/KITS/TRAYS/PACK) ×1 IMPLANT
TROCAR ADV FIXATION 5X100MM (TROCAR) ×1 IMPLANT
TUBING CONNECTING 10 (TUBING) ×4 IMPLANT
TUBING INSUFFLATION 10FT LAP (TUBING) ×1 IMPLANT
TUBING UROLOGY SET (TUBING) IMPLANT

## 2023-02-19 NOTE — Op Note (Signed)
PATIENT: Nicholas Huffman  77 y.o. male  Patient Care Team: Susy Frizzle, MD as PCP - General (Family Medicine) Lelon Perla, MD as PCP - Cardiology (Cardiology)  PREOP DIAGNOSIS: COLOSTOMY STATUS  POSTOP DIAGNOSIS: COLOSTOMY STATUS  PROCEDURE:  Robotic assisted low anterior resection Takedown of Colostomy (Hartmann's reversal) Small bowel resection Lysis of adhesions x 90 minutes Intraoperative assessment of perfusion using ICG fluorescence imaging Flexible sigmoidoscopy Bilateral transversus abdominus plane (TAP) blocks  SURGEON: Sharon Mt. Lurleen Soltero, MD  ASSISTANT: Michael Boston, MD  ANESTHESIA: General endotracheal  EBL: 50 mL Total I/O In: 2000 [I.V.:2000] Out: 280 [Urine:250; Blood:30]  DRAINS: None  SPECIMEN:  Rectosigmoid colon Loop of small bowel Colostomy  COUNTS: Sponge, needle and instrument counts were reported correct x2  FINDINGS: Adhesions related to prior surgery with omentum across his low midline.  There were also dense small bowel adhesions within the pelvis including a loop of small bowel that is densely adherent to his Hartman's stump.  Ultimately, small enterotomy was noted on the small intestine where it was densely adherent to the prior sigmoid staple line.  This necessitated the small bowel resection (inherent to the nature of the procedure).  Evident remnant sigmoid necessitating a low anterior resection in order to have healthy well-perfused rectum took the colon up to. A well perfused, tension free, hemostatic, air tight 29 mm EEA colorectal anastomosis fashioned 15 cm from the anal verge by flexible sigmoidoscopy.   NARRATIVE: Informed consent was verified. The patient was taken to the operating room, placed supine on the operating table and SCD's were applied. General endotracheal anesthesia was induced without difficulty. He was then positioned in the lithotomy position with Allen stirrups.  Pressure points were evaluated and padded.  A  foley catheter was then placed by nursing under sterile conditions. Hair on the abdomen was clipped.  He was secured to the operating table. Dr. Cain Sieve with Urology scrubbed for his portion of the procedure-please refer to his note for details regarding this portion. The abdomen was then prepped and draped in the standard sterile fashion. Surgical timeout was called indicating the correct patient, procedure, positioning and need for preoperative antibiotics.   An OG tube was placed by anesthesia and confirmed to be to suction.  At Palmer's point, a stab incision was created and the Veress needle was introduced into the peritoneal cavity on the first attempt.  Intraperitoneal location was confirmed by the aspiration and saline drop test.  Pneumoperitoneum was established to a maximum pressure of 15 mmHg using CO2.  Following this, the abdomen was marked for planned trocar sites.  Just to the right and cephalad to the umbilicus, an 8 mm incision was created and an 8 mm blunt tipped robotic trocar was cautiously placed into the peritoneal cavity.  The laparoscope was inserted and demonstrated no evidence of trocar site nor Veress needle site complications.  The Veress needle was removed.  Bilateral transversus abdominis plane blocks were then created using a dilute mixture of Exparel with Marcaine.  3 additional 8 mm robotic trochars were placed under direct visualization roughly in a line extending from the right ASIS towards the left upper quadrant. The bladder was inspected and noted to be at/below the pubic symphysis.  An additional 5 mm assist port was placed in the right lateral abdomen under direct visualization.  The abdomen was surveyed and there was adhesions across the low midline of omentum.  There are also small bowel containing adhesions in the pelvis.  The  was positioned in Trendelenburg with the left side tilted slightly up.  Small bowel was carefully retracted out of the pelvis.  The robot was then  docked and I went to the console.  We began with adhesiolysis. Adhesions consisting of omentum were taken down from the anterior midline abdominal wall.  This was done with the vessel sealer.  The surfaces of the omentum were inspected and noted to be hemostatic.  Attention was then turned to the pelvis.  Adhesions consisting of small bowel are noted to his left pelvic sidewall as well as to his presumed remnant sigmoid colon.  Small bowel adhesions were then lysed sharply.  Were able to mobilize the small bowel to the pelvis.  There is a loop of small bowel that is densely adherent to his sigmoid staple line evident with Prolene sutures on either side.  We attempted to free this loop of small bowel off sharply but in doing so did have a small enterotomy which we felt was inherent to the nature of the procedure.  There is also some stricturing at the segment.  It did not appear that a primary repair would be satisfactory from a patency standpoint and therefore we ultimately planned for a small segment of small bowel resection at this location.  Adhesions of the remnant sigmoid stump are also freed.  With near-infrared fluorescence imaging were able to visualize his ureters and stay within the plane well anterior to them.  The remnant sigmoid is fully mobilized as well as the associated sigmoid mesentery.  Total time of adhesiolysis was 90 minutes.  The sigmoid was fully mobilized from its attachments to the left sidewall.  The gonadal vessels were swept down.  The left ureter was identified and swept down.  We stayed in the plane well above the iliac vessels.  The right ureter was also identified.  We fully mobilized the proximalmost aspect of the rectum.  It was apparent that he had a remnant loop of sigmoid colon that would need to be resected in order to allow for a colorectal anastomosis and reduce the risk of subsequent diverticulitis.  The proximalmost aspect of the mesorectum is mobilized out of the  pelvis as well.  A window was then created in the mesorectum at the location of the proximal rectum.  At this location, the Tennie have splayed and are loss of appendices epiploica.  This overlies the sacral promontory.  Anatomically this clearly represents the proximal rectum.  The mesorectum at this level was then cleared using the vessel sealer.  The cut edge of the mesorectum was inspected and noted to be hemostatic.  Attention was then directed at the colostomy.  The descending colon was fully mobilized by incising the Rodriquez Thorner line of Toldt although up to the level of the splenic flexure.  Were able to mobilize the mesocolon medially.  In doing so, we are able to achieve more than adequate reach of his colon into the deep pelvis.  Attention was then directed a intraoperative assessment perfusion.  2.5 mg of indocyanine green was administered by anesthesia.  There is brisk uptake of the tracer in the colon going out to the level of the colostomy.  The rectal stump was inspected and also noted to have good perfusion.  The abdomen is reinspected and noted to be hemostatic.  I scrubbed back in.  The colostomy is then circumferentially incised at the mucocutaneous junction.  Attachments are freed from the surrounding subcutaneous tissue and rectus fascia.  The colostomy  is well-perfused in appearance and pink.  A window was created in the mesocolon at the level of planned transection of his colostomy.  A pursestring clamp was applied.  A 2-0 Prolene on a Lanny Hurst was passed.  The colostomy was then removed and passed off as specimen.   EEA sizers were then used.  A 29 mm EEA was selected.  Belt loop sutures consisting of 3-0 silk were placed around the Prolene pursestring.  The anvil was then placed and the pursestring tied.  A small amount of fat was cleared from the planned location of the circular anastomosis.  This was then placed back into the abdomen. An [place] wound protector was placed. The loop of small  bowel that had a small enterotomy was retrieved and brought out through the colostomy site.  The enterotomy is full-thickness and about 1 x 1 cm in diameter.  It does appear somewhat stenotic at this location where it was densely adherent.  We therefore proceeded with a planned no small bowel resection.  Windows were created the mesentery and outside of the defect.  The small bowel was then divided using a linear cutting GIA blue load staplers.  The intervening mesentery was clamped and divided.  This is then tied off with 2-0 silk sutures.  The 2 respective limbs of small bowel were brought together.  Enterotomies were created on either respective side and a 60 mm blue load GIA stapler was utilized to create the small bowel anastomosis.  The anastomosis is inspected and noted to be hemostatic with well-formed staples.  This is on the antimesenteric edge of each respective limb.  The common enterotomy site is then closed using 2-0 V-Loc sutures in a Connell manner.  The anastomosis is palpated noted to be widely patent.  The mesenteric defect is then closed down using interrupted 3-0 silk suture.  A 3-0 silk sutures also placed at the apex anastomosis.  This is placed back into the abdomen.  It is pink and well-perfused.  A cap was placed over the wound protector.  Pneumoperitoneum was reestablished.  The sigmoidal stump was identified.  Using our former colostomy site we are able to pass a 12 mm trocar through which we placed our laparoscopic 60 mm stapler with a green load across the planned point of transection on the proximalmost aspect of the rectum.  This was divided with a single firing.  The staple line is inspected noted to be intact, well-perfused in appearance, and with well-formed staples.  I went below to pass the stapler.  Digital rectal exam demonstrates a widely patent anal canal.  There is some retained stool that is able to be digitally removed.  EEA sizer passed under direct visualization.  The  29 mm EEA is then deployed. The stapler was passed and the spike deployed just anterior to the staple line.  The components were then mated.  Orientation was confirmed such that there is no twisting of the colon nor small bowel underneath the mesenteric defect. Care was taken to ensure no other structures were incorporated within this either.  The stapler was then closed, held, and fired. This was then removed. The donuts were inspected and noted to be complete.  The colon proximal to the anastomosis was then gently occluded. The pelvis was filled with sterile irrigation. Under direct visualization, I passed a flexible sigmoidoscope.  The anastomosis was under water.  With good distention of the anastomosis there was no air leak. The anastomosis pink in appearance.  This is located at 15 cm from the anal verge by flexible sigmoidoscopy.  It is hemostatic.  Additionally, looking from above, there is no tension on the colon or mesentery.  Sigmoidoscope was withdrawn.  Irrigation was evacuated from the pelvis.  The abdomen and pelvis are surveyed and noted to be completely hemostatic without any apparent injury.  Under direct visualization, all trochars are removed.  The [place] wound protector was removed.  Gowns/gloves are changed and a fresh set of clean instruments utilized. Additional sterile drapes were placed around the field.   The rectus fascia the former colostomy site was closed using 2 running #1 PDS sutures.  All sponge, needle, and instrument counts are reported correct.  The skin is then cinched down using a pursestring of 0 Vicryl suture.  The skin of all incision sites is closed with 4-0 Monocryl subcuticular suture.  Dermabond is applied to the laparoscopic trocar sites.  A moist 4 x 4 gauze was then used to gently work the former colostomy site.  This is then covered with 4 x 4's, ABD, and tape.     He was then taken out of lithotomy, awakened from anesthesia, extubated, and transferred to a  stretcher for transport to PACU in satisfactory condition having tolerated the procedure well.

## 2023-02-19 NOTE — Transfer of Care (Signed)
Immediate Anesthesia Transfer of Care Note  Patient: Nicholas Huffman  Procedure(s) Performed: XI ROBOTIC ASSISTED COLOSTOMY REVERSAL WITH POSSIBLE LOW ANTERIOR RESECTION LYSIS OF ADHESION FLEXIBLE SIGMOIDOSCOPY CYSTOSCOPY with FIREFLY INJECTION  Patient Location: PACU  Anesthesia Type:General  Level of Consciousness: sedated  Airway & Oxygen Therapy: Patient Spontanous Breathing and Patient connected to face mask oxygen  Post-op Assessment: Report given to RN and Post -op Vital signs reviewed and stable  Post vital signs: Reviewed and stable  Last Vitals:  Vitals Value Taken Time  BP 124/70 02/19/23 1208  Temp    Pulse 79 02/19/23 1213  Resp 12 02/19/23 1213  SpO2 93 % 02/19/23 1213  Vitals shown include unvalidated device data.  Last Pain:  Vitals:   02/19/23 0702  TempSrc: Oral         Complications: No notable events documented.

## 2023-02-19 NOTE — Op Note (Signed)
Preoperative diagnosis:  1. Hx of perforated sigmoid colon due to diverticulitis  Postoperative diagnosis: 1. same  Procedure(s): 1. Cystoscopy with bilateral ureteral catheterization for firefly injection  Surgeon: Dr. Donald Pore  Anesthesia: general  Complications: none  EBL: <1 cc  Intraoperative findings: some bilobar enlargement of prostate, mild bladder trabeculations, 8 cc firefly injected in both ureters  Indication: identification of ureters for abdominal/pelvic surgery  Description of procedure:   With appropriate consent having been obtained, the patient was brought to the operative suite. Anesthesia was induced and the patient was prepped and draped in the usual sterile fashion. A timeout was performed  Thereafter the rigid cystoscope was carefully inserted into the bladder. The bladder was inspected thoroughly and no abnormalities were identified.   The right ureteral orifice was identified and cannulated with a 6 fr open ended ureteral catheter. It was easily advanced to the level of the collecting system. The ureteral catheter was then slowly withdrawn while simultaneously injecting the firefly solution over the length of the ureter. Approximately 8 cc total were injected.  The procedure was then repeated in an identical fashion on the left.   A 16 fr foley was then placed in a sterile fashion with immediate return of clear blue/green urine, and the balloon was inflated with 10 cc sterile water.   Donald Pore MD 02/19/2023, 8:58 AM  [facility]  Pager: [phone]

## 2023-02-19 NOTE — H&P (Signed)
CC: Here today for surgery  HPI: Nicholas Huffman is an 77 y.o. male with history of HTN, HLD, afib (on Eliquis), whom is seen in the office today for long-term follow-up regarding possible colostomy takedown.  He has been previously seen and cared for by my partner, Dr. Redmond Pulling, whom took him to surgery for perforated sigmoid diverticulitis with feculent peritonitis 07/17/2022. Sigmoid was adherent to the left pelvic inlet. He was found to have a large perforation about 1-1/2 inches of the sigmoid colon. Significant left pelvic sidewall contamination where it had tried to wall itself off. Contour green load used to divide the rectosigmoid colon. Left side of the staple line was tacked with some suture to prevent retraction along the left pelvic sidewall.  Admitted postoperatively where he recovered. He was discharged home 07/23/2022.  Path - Diverticulosis showing acute diverticulitis with a diverticular abscess  with associated acute fibropurulent serositis with admixed  fecal/vegetative matter consistent with perforation  Margins viable and free of inflammation   Last colonoscopy - 06/19/22 - Dr. Fuller Plan -  - Multiple non-bleeding colonic angiodysplastic lesions. - A tattoo was seen in the ascending colon. The tattoo site appeared normal. - Six 6 to 9 mm polyps in the sigmoid colon, in the descending colon and in the transverse colon, removed with a cold snare. Resected and retrieved. - Moderate diverticulosis in the left colon. - Anal papilla(e) were hypertrophied. - Internal hemorrhoids. - The examination was otherwise normal on direct and retroflexion views.  Referred to see me for evaluation of colostomy takedown surgery. He has been doing well. Colostomy working fine without issue but does not like having a colostomy. Would like it reversed. Denies abdominal pain, nausea/vomiting, constipation, diarrhea. All wounds have healed.  Denies any changes in his health or health hx since we met  in the office. Tolerated bowel prep with satisfactory result. Off Eliquis > 48 hrs now. Denies complaints, feels well.    PMH: HTN, HLD, afib (on Eliquis) - follows with Dr. Kirk Ruths  PSH: Exlap/Hartmann's 07/2022 - path diverticulitis  FHx: Denies any known family history of colorectal, breast, endometrial or ovarian cancer  Social Hx: Denies use of tobacco/EtOH/illicit drug. Quit smoking 07/2022. Retired, previously worked for [employer]   Past Medical History:  Diagnosis Date   A-fib ([place])    Cancer ([place])    basal cell   Cataract    Diverticulitis    Fractured skull ([place])    as a child   Hyperlipidemia    Hypertension    Primary localized osteoarthritis of right knee    hx of- knee has been replaced   Smoker    Wears glasses    Wears hearing aid in both ears     Past Surgical History:  Procedure Laterality Date   BASAL CELL CARCINOMA EXCISION  2016   face   CARDIOVERSION N/A 09/12/2022   Procedure: CARDIOVERSION;  Surgeon: Jerline Pain, MD;  Location: [facility];  Service: Cardiovascular;  Laterality: N/A;   CATARACT EXTRACTION W/ INTRAOCULAR LENS  IMPLANT, BILATERAL     COLONOSCOPY  2008, 2002   hx polyps/Dr. Fuller Plan   EYE SURGERY     retina detachment repair x 2   KNEE ARTHROSCOPY  April/2013   left   LAPAROTOMY N/A 07/16/2022   Procedure: EXPLORATORY LAPAROTOMY, HARTMAN'S PROCEDURE;  Surgeon: Greer Pickerel, MD;  Location: [facility];  Service: General;  Laterality: N/A;   TOTAL KNEE ARTHROPLASTY Right 03/26/2018   Procedure: RIGHT TOTAL  KNEE ARTHROPLASTY;  Surgeon: Leandrew Koyanagi, MD;  Location: [facility];  Service: Orthopedics;  Laterality: Right;    Family History  Problem Relation Age of Onset   Cancer Mother    Breast cancer Mother    Cancer Father    Colon cancer Neg Hx    Esophageal cancer Neg Hx    Rectal cancer Neg Hx    Stomach cancer Neg Hx     Social:  reports that he quit smoking about 7 months ago. His smoking use included cigarettes. He has  a 60.00 pack-year smoking history. He has never used smokeless tobacco. He reports that he does not drink alcohol and does not use drugs.  Allergies: No Known Allergies  Medications: I have reviewed the patient's current medications.  No results found for this or any previous visit (from the past 48 hour(s)).  No results found.  ROS - all of the below systems have been reviewed with the patient and positives are indicated with bold text General: chills, fever or night sweats Eyes: blurry vision or double vision ENT: epistaxis or sore throat Allergy/Immunology: itchy/watery eyes or nasal congestion Hematologic/Lymphatic: bleeding problems, blood clots or swollen lymph nodes Endocrine: temperature intolerance or unexpected weight changes Breast: new or changing breast lumps or nipple discharge Resp: cough, shortness of breath, or wheezing CV: chest pain or dyspnea on exertion GI: as per HPI GU: dysuria, trouble voiding, or hematuria MSK: joint pain or joint stiffness Neuro: TIA or stroke symptoms Derm: pruritus and skin lesion changes Psych: anxiety and depression  PE Blood pressure 131/80, pulse 76, temperature 97.7 F (36.5 C), temperature source Oral, resp. rate 20, height '5\' 6"'$  (1.676 m), weight 75.2 kg, SpO2 94 %. Constitutional: NAD; conversant Eyes: Moist conjunctiva Lungs: Normal respiratory effort CV: RRR MSK: Normal range of motion of extremities Psychiatric: Appropriate affect; alert and oriented x3  No results found for this or any previous visit (from the past 48 hour(s)).  No results found.   A/P: Nicholas Huffman is an 77 y.o. male with hx of HTN, HLD, afib (on Eliquis), here for evaluation of colostomy takedown - hx of exlap/Hartmann's 07/2022 for perforated diverticulitis with feculent perforation  -GGE clear -Cardiology clearance with plans for holding his Eliquis perioperatively - Dr. Stanford Breed  -The anatomy and physiology of the GI tract was reviewed with  the patient. The pathophysiology of diverticulitis was discussed as well with associated pictures. -We spent time reviewing life with a colostomy and that reversal surgery is "elective" in the sense that it is possible to live a normal life with a colostomy. He remains highly motivated to have this reversed for quality-of-life indications.\  -We therefore discussed-robotic assisted colostomy takedown with possible low anterior resection; lysis of adhesions; flexible sigmoidoscopy; ureteral ICG/firefly ([facility]).  -The planned procedure, material risks (including, but not limited to, pain, bleeding, infection, scarring, need for blood transfusion, damage to surrounding structures- blood vessels/nerves/viscus/organs, damage to ureter, urine leak, leak from anastomosis, need for additional procedures, scenarios where another stoma may be necessary and where it may be permanent, worsening of pre-existing medical conditions, hernia, recurrence, pneumonia, heart attack, stroke, death) benefits and alternatives to surgery were discussed at length. The patient's and his wife's questions were answered to their satisfaction, they voiced understanding and elected to proceed with surgery. Additionally, we discussed typical postoperative expectations and the recovery process.  Nadeen Landau, [facility], [place]

## 2023-02-19 NOTE — Anesthesia Postprocedure Evaluation (Signed)
Anesthesia Post Note  Patient: Nicholas Huffman  Procedure(s) Performed: XI ROBOTIC ASSISTED COLOSTOMY REVERSAL WITH POSSIBLE LOW ANTERIOR RESECTION LYSIS OF ADHESION FLEXIBLE SIGMOIDOSCOPY CYSTOSCOPY with FIREFLY INJECTION     Patient location during evaluation: PACU Anesthesia Type: General Level of consciousness: awake and alert Pain management: pain level controlled Vital Signs Assessment: post-procedure vital signs reviewed and stable Respiratory status: spontaneous breathing, nonlabored ventilation, respiratory function stable and patient connected to nasal cannula oxygen Cardiovascular status: blood pressure returned to baseline and stable Postop Assessment: no apparent nausea or vomiting Anesthetic complications: no   No notable events documented.  Last Vitals:  Vitals:   02/19/23 1215 02/19/23 1230  BP: 129/63 127/67  Pulse: 82 75  Resp: 13 16  Temp:    SpO2: 93% 94%    Last Pain:  Vitals:   02/19/23 1230  TempSrc:   PainSc: Asleep                 Theresia Pree

## 2023-02-19 NOTE — Anesthesia Procedure Notes (Signed)
Procedure Name: Intubation Date/Time: 02/19/2023 8:29 AM  Performed by: Lind Covert, CRNAPre-anesthesia Checklist: Patient identified, Emergency Drugs available, Suction available, Patient being monitored and Timeout performed Patient Re-evaluated:Patient Re-evaluated prior to induction Oxygen Delivery Method: Circle system utilized Preoxygenation: Pre-oxygenation with 100% oxygen Induction Type: IV induction, Rapid sequence and Cricoid Pressure applied Laryngoscope Size: Mac and 4 Grade View: Grade I Tube type: Oral Tube size: 7.5 mm Number of attempts: 1 Airway Equipment and Method: Stylet Placement Confirmation: ETT inserted through vocal cords under direct vision, positive ETCO2 and breath sounds checked- equal and bilateral Secured at: 22 cm Tube secured with: Tape Dental Injury: Teeth and Oropharynx as per pre-operative assessment

## 2023-02-20 ENCOUNTER — Encounter (HOSPITAL_COMMUNITY): Payer: Self-pay | Admitting: Surgery

## 2023-02-20 LAB — BASIC METABOLIC PANEL
Anion gap: 11 (ref 5–15)
BUN: 31 mg/dL — ABNORMAL HIGH (ref 8–23)
CO2: 22 mmol/L (ref 22–32)
Calcium: 8.6 mg/dL — ABNORMAL LOW (ref 8.9–10.3)
Chloride: 97 mmol/L — ABNORMAL LOW (ref 98–111)
Creatinine, Ser: 1.59 mg/dL — ABNORMAL HIGH (ref 0.61–1.24)
GFR, Estimated: 45 mL/min — ABNORMAL LOW (ref >60)
Glucose, Bld: 139 mg/dL — ABNORMAL HIGH (ref 70–99)
Potassium: 4.3 mmol/L (ref 3.5–5.1)
Sodium: 130 mmol/L — ABNORMAL LOW (ref 135–145)

## 2023-02-20 LAB — CBC
HCT: 43.1 % (ref 39.0–52.0)
Hemoglobin: 14.4 g/dL (ref 13.0–17.0)
MCH: 29.4 pg (ref 26.0–34.0)
MCHC: 33.4 g/dL (ref 30.0–36.0)
MCV: 88.1 fL (ref 80.0–100.0)
Platelets: 237 10*3/uL (ref 150–400)
RBC: 4.89 MIL/uL (ref 4.22–5.81)
RDW: 13.7 % (ref 11.5–15.5)
WBC: 12.8 10*3/uL — ABNORMAL HIGH (ref 4.0–10.5)
nRBC: 0 % (ref 0.0–0.2)

## 2023-02-20 LAB — SURGICAL PATHOLOGY

## 2023-02-20 MED ORDER — LACTATED RINGERS IV BOLUS
500.0000 mL | Freq: Once | INTRAVENOUS | Status: AC
  Administered 2023-02-20: 500 mL via INTRAVENOUS

## 2023-02-20 NOTE — Progress Notes (Signed)
PT Cancellation Note  Patient Details Name: Nicholas Huffman MRN: [id number] DOB: 05-06-1946   Cancelled Treatment:     PT order received but eval deferred - pt mobilizing IND in room and hall.  No PT needs identified and PT will sign off.   Addaline Peplinski 02/20/2023, 11:04 AM

## 2023-02-20 NOTE — Progress Notes (Signed)
  Subjective No acute events. Doing well. No complaints. Feeling well. No n/v. Ambulating, up in chair this morning. Denies abdominal pain. No flatus/BM yet.  Objective: Vital signs in last 24 hours: Temp:  [97.4 F (36.3 C)-98 F (36.7 C)] 98 F (36.7 C) (03/14 0500) Pulse Rate:  [64-100] 100 (03/14 0500) Resp:  [12-19] 18 (03/14 0500) BP: (101-139)/(58-82) 128/67 (03/14 0500) SpO2:  [90 %-97 %] 94 % (03/14 0500) Weight:  [76.2 kg] 76.2 kg (03/14 0500) Last BM Date : 02/18/23  Intake/Output from previous day: 03/13 0701 - 03/14 0700 In: 3371.3 [P.O.:240; I.V.:3131.3] Out: 1380 [Urine:1350; Blood:30] Intake/Output this shift: No intake/output data recorded.  Gen: NAD, comfortable CV: RRR Pulm: Normal work of breathing Abd: Soft, appropriate incisional tenderness, no tenderness elsewhere, nondistended; incisions clean and dry without erythema Ext: SCDs in place  Lab Results: CBC  Recent Labs    02/20/23 0433  WBC 12.8*  HGB 14.4  HCT 43.1  PLT 237   BMET Recent Labs    02/20/23 0433  NA 130*  K 4.3  CL 97*  CO2 22  GLUCOSE 139*  BUN 31*  CREATININE 1.59*  CALCIUM 8.6*   PT/INR No results for input(s): "LABPROT", "INR" in the last 72 hours. ABG No results for input(s): "PHART", "HCO3" in the last 72 hours.  Invalid input(s): "PCO2", "PO2"  Studies/Results:  Anti-infectives: Anti-infectives (From admission, onward)    Start     Dose/Rate Route Frequency Ordered Stop   02/19/23 1400  neomycin (MYCIFRADIN) tablet 1,000 mg  Status:  Discontinued       See Hyperspace for full Linked Orders Report.   1,000 mg Oral 3 times per day 02/19/23 0644 02/19/23 0651   02/19/23 1400  metroNIDAZOLE (FLAGYL) tablet 1,000 mg  Status:  Discontinued       See Hyperspace for full Linked Orders Report.   1,000 mg Oral 3 times per day 02/19/23 0644 02/19/23 0651   02/19/23 0645  cefoTEtan (CEFOTAN) 2 g in sodium chloride 0.9 % 100 mL IVPB        2 g 200 mL/hr over 30  Minutes Intravenous On call to O.R. 02/19/23 0644 02/19/23 0831        Assessment/Plan: Patient Active Problem List   Diagnosis Date Noted   S/P colostomy takedown 02/19/2023   Secondary hypercoagulable state ([place]) 10/22/2022   Aortic dilatation ([place]) 09/02/2022   PAF (paroxysmal atrial fibrillation) ([place]) 09/02/2022   Status post surgery 07/16/2022   Perforation of sigmoid colon due to diverticulitis 07/16/2022   History of total knee replacement, right 03/26/2018   Primary localized osteoarthritis of right knee 02/17/2018   Hyperlipidemia    Hypertension    Smoker    s/p Procedure(s): XI ROBOTIC ASSISTED COLOSTOMY REVERSAL WITH POSSIBLE LOW ANTERIOR RESECTION LYSIS OF ADHESION FLEXIBLE SIGMOIDOSCOPY CYSTOSCOPY with FIREFLY INJECTION 02/19/2023  -Doing great -Ambulate 5x/day -Acute vs chronic kidney dysfunction undetermined- Cr baselines fluxate from normal to ~1.5. IVF support, bolus 500 cc x1 -Liquids, advance to solid diet as tolerated -Continue entereg - awaiting return of bowel fxn -Ppx: SCDs, SQH; restart Eliquis tomorrow if doing well and hgb remains stable   LOS: 1 day   Nadeen Landau, MD [facility], [place]

## 2023-02-20 NOTE — Evaluation (Signed)
Occupational Therapy Evaluation Patient Details Name: Nicholas Huffman MRN: [id number] DOB: 05-29-1946 Today's Date: 02/20/2023   History of Present Illness Nicholas Huffman is an 78 y.o. male with hx of HTN, HLD, afib (on Eliquis), here s/p colostomy takedown on 02/19/23 - hx of exlap/Hartmann's 07/2022 for perforated diverticulitis with feculent perforation.   Clinical Impression   Pt admitted as above, presenting with deficits as listed below (refer to OT problem list). Pt was received walking in hallway with RN staff this morning. He was educated in role of OT and agreeable to functional transfer into bathroom and on/off commode with supervision assist and ocassional vc's for safety with RW use. Pt was also set-up-min guard for LB ADL's at this time. He is currently Mod I sitting for grooming or supervision in standing. Pt plans to d/c home with wife whom can provide PRN assist at discharge per his report. They have a 1 story home with 2 STE, rails on the left. He was educated in taking rest breaks and sitting for tasks PRN during ADL's. Will follow acutely for OT to assist in maximizing independence with ADL's and self care tasks as well as functional transfers. No further OT needs anticipated at d/c.      Recommendations for follow up therapy are one component of a multi-disciplinary discharge planning process, led by the attending physician.  Recommendations may be updated based on patient status, additional functional criteria and insurance authorization.   Follow Up Recommendations  No OT follow up     Assistance Recommended at Discharge PRN  Patient can return home with the following A little help with walking and/or transfers;A little help with bathing/dressing/bathroom;Help with stairs or ramp for entrance;Assist for transportation    Functional Status Assessment  Patient has had a recent decline in their functional status and demonstrates the ability to make significant improvements in  function in a reasonable and predictable amount of time.  Equipment Recommendations  Other (comment) (Possible a/e PRN)    Recommendations for Other Services       Precautions / Restrictions Precautions Precautions: Fall Restrictions Weight Bearing Restrictions: No      Mobility Bed Mobility   General bed mobility comments: Pt was received up, walking in hallway with RN staff    Transfers Overall transfer level: Needs assistance Equipment used: Rolling walker (2 wheels) Transfers: Sit to/from Stand, Bed to chair/wheelchair/BSC Sit to Stand: Supervision, Min guard   Step pivot transfers: Supervision   General transfer comment: Supervision during ambulation in room, bathroom, hallway; min guard assist with initial sit to stand.      Balance Overall balance assessment: Mild deficits observed, not formally tested Sitting-balance support: Feet supported Sitting balance-Leahy Scale: Good   Standing balance support: Bilateral upper extremity supported, No upper extremity supported Standing balance-Leahy Scale: Fair     ADL either performed or assessed with clinical judgement   ADL Overall ADL's : Needs assistance/impaired     Grooming: Wash/dry hands;Wash/dry face;Oral care;Brushing hair;Sitting;Standing;Modified independent;Supervision/safety   Upper Body Bathing: Set up;Sitting   Lower Body Bathing: Set up;Sit to/from stand;Sitting/lateral leans;Min guard   Upper Body Dressing : Set up;Sitting   Lower Body Dressing: Sitting/lateral leans;Min guard;Sit to/from stand Lower Body Dressing Details (indicate cue type and reason): Pt wearing slip on shoes, able to don/doff in sitting, Min guard for sit to stand Toilet Transfer: Supervision/safety;Ambulation;Regular Glass blower/designer Details (indicate cue type and reason): Pt required vc's for hand placement & sequencing with RW Toileting- Clothing Manipulation and  Hygiene: Supervision/safety;Sitting/lateral lean      Tub/Shower Transfer Details (indicate cue type and reason): Pt reports walk in shower at home Functional mobility during ADLs: Supervision/safety;Min guard;Rolling walker (2 wheels) General ADL Comments: Pt was received walking in hallway with RN staff this morning. He was educated in role of OT and agreeable to functional transfer into bathroom and on/off commode with supervision assist and ocassional vc's for safety with RW use. Pt was also set-up-min guard for LB ADL's at this time. He is currently Mod I sitting for grooming or supervision in standing. Pt plans to d/c home with wife whom can provide PRN assist at discharge per his report. They have a 1 story home with 2 STE, rails on the left. He was educated in taking rest breaks and sitting for tasks PRN during ADL's. Will follow acutely for OT to assist in maximizing independence with ADL's and self care tasks as well as functional transfers. No further OT needs anticipated at d/c.      Vision Baseline Vision/History: 1 Wears glasses Patient Visual Report: No change from baseline Vision Assessment?: No apparent visual deficits            Pertinent Vitals/Pain Pain Assessment Pain Assessment: No/denies pain     Hand Dominance Right   Extremity/Trunk Assessment Upper Extremity Assessment Upper Extremity Assessment: Overall WFL for tasks assessed   Lower Extremity Assessment Lower Extremity Assessment: Defer to PT evaluation       Communication Communication Communication: HOH   Cognition Arousal/Alertness: Awake/alert Behavior During Therapy: WFL for tasks assessed/performed Overall Cognitive Status: Within Functional Limits for tasks assessed       General Comments  VSS on RA. Pt appears fatigued after ambulating in hallway with RN staff but denies pain and denies need for rest break as well.            Home Living Family/patient expects to be discharged to:: Private residence Living Arrangements:  Spouse/significant other Available Help at Discharge: Family;Available 24 hours/day Type of Home: House Home Access: Stairs to enter [employer] of Steps: 2 Entrance Stairs-Rails: Left Home Layout: One level     Bathroom Shower/Tub: Tub/shower unit;Walk-in shower   Bathroom Toilet: Standard     Home Equipment: Conservation officer, nature (2 wheels);Shower seat - built in      Prior Functioning/Environment Prior Level of Function : Independent/Modified Independent;Driving   Mobility Comments: Independent without DME, drives, retired from work ADLs Comments: stands to shower, has built in Civil engineer, contracting if needed        OT Problem List: Impaired balance (sitting and/or standing);Decreased activity tolerance;Decreased knowledge of use of DME or AE      OT Treatment/Interventions: Self-care/ADL training;DME and/or AE instruction;Therapeutic activities;Energy conservation;Patient/family education    OT Goals(Current goals can be found in the care plan section) Acute Rehab OT Goals Patient Stated Goal: Go home. Not use RW OT Goal Formulation: With patient Time For Goal Achievement: 03/06/23 Potential to Achieve Goals: Good  OT Frequency: Min 2X/week       AM-PAC OT "6 Clicks" Daily Activity     Outcome Measure Help from another person eating meals?: None Help from another person taking care of personal grooming?: A Little Help from another person toileting, which includes using toliet, bedpan, or urinal?: A Little Help from another person bathing (including washing, rinsing, drying)?: A Little Help from another person to put on and taking off regular upper body clothing?: None Help from another person to put on and taking off  regular lower body clothing?: A Little 6 Click Score: 20   End of Session Equipment Utilized During Treatment: Rolling walker (2 wheels) Nurse Communication: Mobility status;Other (comment) (Pt iv alarm stating iv complete)  Activity Tolerance: Patient  tolerated treatment well;No increased pain Patient left: in chair;with call bell/phone within reach  OT Visit Diagnosis: Unsteadiness on feet (R26.81)                Time: [id number] OT Time Calculation (min): 17 min Charges:  OT General Charges $OT Visit: 1 Visit OT Evaluation $OT Eval Low Complexity: 1 Low  Jenesys Casseus Beth Dixon, OTR/L 02/20/2023, 8:27 AM

## 2023-02-20 NOTE — TOC CM/SW Note (Signed)
  Transition of Care [facility]) Screening Note   Patient Details  Name: Nicholas Huffman Date of Birth: 05-Jan-1946   Transition of Care [facility]) CM/SW Contact:    Lennart Pall, LCSW Phone Number: 02/20/2023, 9:57 AM    Transition of Care Department [facility]) has reviewed patient and no TOC needs have been identified at this time. We will continue to monitor patient advancement through interdisciplinary progression rounds. If new patient transition needs arise, please place a TOC consult.

## 2023-02-21 LAB — BASIC METABOLIC PANEL
Anion gap: 8 (ref 5–15)
BUN: 28 mg/dL — ABNORMAL HIGH (ref 8–23)
CO2: 25 mmol/L (ref 22–32)
Calcium: 8.8 mg/dL — ABNORMAL LOW (ref 8.9–10.3)
Chloride: 104 mmol/L (ref 98–111)
Creatinine, Ser: 1.29 mg/dL — ABNORMAL HIGH (ref 0.61–1.24)
GFR, Estimated: 57 mL/min — ABNORMAL LOW (ref >60)
Glucose, Bld: 97 mg/dL (ref 70–99)
Potassium: 3.8 mmol/L (ref 3.5–5.1)
Sodium: 137 mmol/L (ref 135–145)

## 2023-02-21 LAB — CBC
HCT: 41.1 % (ref 39.0–52.0)
Hemoglobin: 13.3 g/dL (ref 13.0–17.0)
MCH: 29.4 pg (ref 26.0–34.0)
MCHC: 32.4 g/dL (ref 30.0–36.0)
MCV: 90.7 fL (ref 80.0–100.0)
Platelets: 207 10*3/uL (ref 150–400)
RBC: 4.53 MIL/uL (ref 4.22–5.81)
RDW: 14.3 % (ref 11.5–15.5)
WBC: 10.6 10*3/uL — ABNORMAL HIGH (ref 4.0–10.5)
nRBC: 0 % (ref 0.0–0.2)

## 2023-02-21 MED ORDER — OXYCODONE HCL 5 MG PO TABS: 5.0000 mg | ORAL_TABLET | Freq: Three times a day (TID) | ORAL | 0 refills | Status: AC | PRN

## 2023-02-21 MED ORDER — APIXABAN 5 MG PO TABS
5.0000 mg | ORAL_TABLET | Freq: Two times a day (BID) | ORAL | Status: DC
  Administered 2023-02-21 (×2): 5 mg via ORAL
  Filled 2023-02-21 (×2): qty 1

## 2023-02-21 NOTE — Progress Notes (Signed)
Mobility Specialist - Progress Note   02/21/23 1503  Mobility  Activity Ambulated independently in hallway  Level of Assistance Independent  Assistive Device None  Distance Ambulated (ft) 500 ft  Activity Response Tolerated well  Mobility Referral Yes  $Mobility charge 1 Mobility   Pt received in recliner and agreeable to mobility. No complaints during session. Pt to recliner after session with all needs met.     [facility]

## 2023-02-21 NOTE — Discharge Instructions (Signed)
POST OP INSTRUCTIONS AFTER COLON SURGERY  DIET: Be sure to include lots of fluids daily to stay hydrated - 64oz of water per day (8, 8 oz glasses).  Avoid fast food or heavy meals for the first couple of weeks as your are more likely to get nauseated. Avoid raw/uncooked fruits or vegetables for the first 4 weeks (its ok to have these if they are blended into smoothie form). If you have fruits/vegetables, make sure they are cooked until soft enough to mash on the roof of your mouth and chew your food well. Otherwise, diet as tolerated.  Take your usually prescribed home medications unless otherwise directed.  PAIN CONTROL: Pain is best controlled by a usual combination of three different methods TOGETHER: Ice/Heat Over the counter pain medication Prescription pain medication Most patients will experience some swelling and bruising around the surgical site.  Ice packs or heating pads (30-60 minutes up to 6 times a day) will help. Some people prefer to use ice alone, heat alone, alternating between ice & heat.  Experiment to what works for you.  Swelling and bruising can take several weeks to resolve.   It is helpful to take an over-the-counter pain medication regularly for the first few weeks: Ibuprofen (Motrin/Advil) - 200mg  tabs - take 3 tabs (600mg ) every 6 hours as needed for pain (unless you have been directed previously to avoid NSAIDs/ibuprofen) Acetaminophen (Tylenol) - you may take 650mg  every 6 hours as needed. You can take this with motrin as they act differently on the body. If you are taking a narcotic pain medication that has acetaminophen in it, do not take over the counter tylenol at the same time. NOTE: You may take both of these medications together - most patients  find it most helpful when alternating between the two (i.e. Ibuprofen at 6am, tylenol at 9am, ibuprofen at 12pm ..Marland Kitchen) A  prescription for pain medication should be given to you upon discharge.  Take your pain medication as  prescribed if your pain is not adequatly controlled with the over-the-counter pain reliefs mentioned above.  Avoid getting constipated.  Between the surgery and the pain medications, it is common to experience some constipation.  Increasing fluid intake and taking a fiber supplement (such as Metamucil, Citrucel, FiberCon, MiraLax, etc) 1-2 times a day regularly will usually help prevent this problem from occurring.  A mild laxative (prune juice, Milk of Magnesia, MiraLax, etc) should be taken according to package directions if there are no bowel movements after 48 hours.    Dressing: Your incisions are covered in Dermabond which is like sterile superglue for the skin. This will come off on it's own in a couple weeks. It is waterproof and you may bathe normally starting the day after your surgery in a shower. Avoid baths/pools/lakes/oceans until your wounds have fully healed. Your prior ostomy site - cover with dry gauze. Ok to bathe normally, getting soap/water in the wound bed. This will heal on its own over the next month or two.  ACTIVITIES as tolerated:   Avoid heavy lifting (>10lbs or 1 gallon of milk) for the next 6 weeks. You may resume regular daily activities as tolerated--such as daily self-care, walking, climbing stairs--gradually increasing activities as tolerated.  If you can walk 30 minutes without difficulty, it is safe to try more intense activity such as jogging, treadmill, bicycling, low-impact aerobics.  DO NOT PUSH THROUGH PAIN.  Let pain be your guide: If it hurts to do something, don't do it. You may  drive when you are no longer taking prescription pain medication, you can comfortably wear a seatbelt, and you can safely maneuver your car and apply brakes.  FOLLOW UP in our office Please call [facility] at [phone] to set up an appointment to see your surgeon in the office for a follow-up appointment approximately 2 weeks after your surgery. Make sure that you call for this  appointment the day you arrive home to insure a convenient appointment time.  9. If you have disability or family leave forms that need to be completed, you may have them completed by your primary care physician's office; for return to work instructions, please ask our office staff and they will be happy to assist you in obtaining this documentation   When to call us [phone]: Poor pain control Reactions / problems with new medications (rash/itching, etc)  Fever over 101.5 F (38.5 C) Inability to urinate Nausea/vomiting Worsening swelling or bruising Continued bleeding from incision. Increased pain, redness, or drainage from the incision  The clinic staff is available to answer your questions during regular business hours (8:30am-5pm).  Please don't hesitate to call and ask to speak to one of our nurses for clinical concerns.   A surgeon from [facility] is always on call at the hospitals   If you have a medical emergency, go to the nearest emergency room or call 911.  [facility], [place] [address] [place], [place], [place]  [address] MAIN: [phone] FAX: [phone] [email or web address]

## 2023-02-21 NOTE — Progress Notes (Addendum)
  Subjective No acute events. Doing well. No complaints. Feeling well. No n/v. Ambulating, up in chair this morning. Denies abdominal pain. No flatus/BM yet.  Objective: Vital signs in last 24 hours: Temp:  [97.6 F (36.4 C)] 97.6 F (36.4 C) (03/15 1344) Pulse Rate:  [59-106] 59 (03/15 1344) Resp:  [16-20] 18 (03/15 1344) BP: (119-173)/(61-89) 119/61 (03/15 1344) SpO2:  [94 %-96 %] 95 % (03/15 1344) Weight:  [76.8 kg] 76.8 kg (03/15 0500) Last BM Date : 02/18/23  Intake/Output from previous day: 03/14 0701 - 03/15 0700 In: 2122.5 [P.O.:360; I.V.:1762.5] Out: 100 [Urine:100] Intake/Output this shift: Total I/O In: 675 [P.O.:480; I.V.:195] Out: 0   Gen: NAD, comfortable CV: RRR Pulm: Normal work of breathing Abd: Soft, appropriate incisional tenderness, no tenderness elsewhere, nondistended; incisions clean and dry without erythema Ext: SCDs in place  Lab Results: CBC  Recent Labs    02/20/23 0433 02/21/23 0512  WBC 12.8* 10.6*  HGB 14.4 13.3  HCT 43.1 41.1  PLT 237 207   BMET Recent Labs    02/20/23 0433 02/21/23 0512  NA 130* 137  K 4.3 3.8  CL 97* 104  CO2 22 25  GLUCOSE 139* 97  BUN 31* 28*  CREATININE 1.59* 1.29*  CALCIUM 8.6* 8.8*   PT/INR No results for input(s): "LABPROT", "INR" in the last 72 hours. ABG No results for input(s): "PHART", "HCO3" in the last 72 hours.  Invalid input(s): "PCO2", "PO2"  Studies/Results:  Anti-infectives: Anti-infectives (From admission, onward)    Start     Dose/Rate Route Frequency Ordered Stop   02/19/23 1400  neomycin (MYCIFRADIN) tablet 1,000 mg  Status:  Discontinued       See Hyperspace for full Linked Orders Report.   1,000 mg Oral 3 times per day 02/19/23 0644 02/19/23 0651   02/19/23 1400  metroNIDAZOLE (FLAGYL) tablet 1,000 mg  Status:  Discontinued       See Hyperspace for full Linked Orders Report.   1,000 mg Oral 3 times per day 02/19/23 0644 02/19/23 0651   02/19/23 0645  cefoTEtan (CEFOTAN)  2 g in sodium chloride 0.9 % 100 mL IVPB        2 g 200 mL/hr over 30 Minutes Intravenous On call to O.R. 02/19/23 0644 02/19/23 0831        Assessment/Plan: Patient Active Problem List   Diagnosis Date Noted   S/P colostomy takedown 02/19/2023   Secondary hypercoagulable state ([place]) 10/22/2022   Aortic dilatation ([facility]) 09/02/2022   PAF (paroxysmal atrial fibrillation) ([place]) 09/02/2022   Status post surgery 07/16/2022   Perforation of sigmoid colon due to diverticulitis 07/16/2022   History of total knee replacement, right 03/26/2018   Primary localized osteoarthritis of right knee 02/17/2018   Hyperlipidemia    Hypertension    Smoker    s/p Procedure(s): XI ROBOTIC ASSISTED COLOSTOMY REVERSAL WITH POSSIBLE LOW ANTERIOR RESECTION LYSIS OF ADHESION FLEXIBLE SIGMOIDOSCOPY CYSTOSCOPY with FIREFLY INJECTION 02/19/2023  -Doing great -Ambulate 5x/day -Acute vs chronic kidney dysfunction undetermined- Cr trending in appropriate direction, near normal -Soft diet -Having flatus and BM, d/c entereg -Ppx: SCDs, oredered Eliquis to start today -Possible discharge tomorrow if doing well back on Eliquis   LOS: 2 days   Nadeen Landau, MD [facility], [place]

## 2023-02-21 NOTE — Progress Notes (Signed)
Mobility Specialist - Progress Note   02/21/23 1022  Mobility  Activity Ambulated independently in hallway  Level of Assistance Independent  Assistive Device None  Distance Ambulated (ft) 500 ft  Activity Response Tolerated well  Mobility Referral Yes  $Mobility charge 1 Mobility   Pt received in recliner and agreeable to mobility. No complaints during session. Pt to recliner after session with all needs met & wife in room.  [facility]

## 2023-02-22 LAB — BASIC METABOLIC PANEL
Anion gap: 8 (ref 5–15)
BUN: 33 mg/dL — ABNORMAL HIGH (ref 8–23)
CO2: 23 mmol/L (ref 22–32)
Calcium: 8.4 mg/dL — ABNORMAL LOW (ref 8.9–10.3)
Chloride: 108 mmol/L (ref 98–111)
Creatinine, Ser: 1.3 mg/dL — ABNORMAL HIGH (ref 0.61–1.24)
GFR, Estimated: 57 mL/min — ABNORMAL LOW (ref >60)
Glucose, Bld: 121 mg/dL — ABNORMAL HIGH (ref 70–99)
Potassium: 4 mmol/L (ref 3.5–5.1)
Sodium: 139 mmol/L (ref 135–145)

## 2023-02-22 LAB — CBC
HCT: 32.9 % — ABNORMAL LOW (ref 39.0–52.0)
Hemoglobin: 10.6 g/dL — ABNORMAL LOW (ref 13.0–17.0)
MCH: 29.9 pg (ref 26.0–34.0)
MCHC: 32.2 g/dL (ref 30.0–36.0)
MCV: 92.7 fL (ref 80.0–100.0)
Platelets: 223 10*3/uL (ref 150–400)
RBC: 3.55 MIL/uL — ABNORMAL LOW (ref 4.22–5.81)
RDW: 14.6 % (ref 11.5–15.5)
WBC: 9.9 10*3/uL (ref 4.0–10.5)
nRBC: 0 % (ref 0.0–0.2)

## 2023-02-22 LAB — TYPE AND SCREEN
ABO/RH(D): O POS
Antibody Screen: NEGATIVE

## 2023-02-22 MED ORDER — EMPTY CONTAINERS FLEXIBLE MISC
900.0000 mg | Freq: Once | Status: AC
  Administered 2023-02-22: 900 mg via INTRAVENOUS
  Filled 2023-02-22: qty 90

## 2023-02-22 NOTE — Progress Notes (Signed)
  Subjective Had a lot of LGI bleeding last night.  Elliquis stopped and factor 10 given.  Diarrhea has slowed.    Objective: Vital signs in last 24 hours: Temp:  [97.6 F (36.4 C)-98.2 F (36.8 C)] 98.2 F (36.8 C) (03/16 0438) Pulse Rate:  [50-85] 85 (03/16 0441) Resp:  [16-20] 16 (03/16 0438) BP: (119-141)/(61-93) 141/65 (03/16 0645) SpO2:  [95 %-97 %] 95 % (03/16 0441) Last BM Date : 02/22/23  Intake/Output from previous day: 03/15 0701 - 03/16 0700 In: 915 [P.O.:720; I.V.:195] Out: 0  Intake/Output this shift: No intake/output data recorded.  Gen: NAD, comfortable Abd: Soft, appropriate incisional tenderness, no tenderness elsewhere, nondistended; incisions clean and dry without erythema Ext: SCDs in place  Lab Results: CBC  Recent Labs    02/21/23 0512 02/22/23 0434  WBC 10.6* 9.9  HGB 13.3 10.6*  HCT 41.1 32.9*  PLT 207 223    BMET Recent Labs    02/21/23 0512 02/22/23 0434  NA 137 139  K 3.8 4.0  CL 104 108  CO2 25 23  GLUCOSE 97 121*  BUN 28* 33*  CREATININE 1.29* 1.30*  CALCIUM 8.8* 8.4*    PT/INR No results for input(s): "LABPROT", "INR" in the last 72 hours. ABG No results for input(s): "PHART", "HCO3" in the last 72 hours.  Invalid input(s): "PCO2", "PO2"  Studies/Results:  Anti-infectives: Anti-infectives (From admission, onward)    Start     Dose/Rate Route Frequency Ordered Stop   02/19/23 1400  neomycin (MYCIFRADIN) tablet 1,000 mg  Status:  Discontinued       See Hyperspace for full Linked Orders Report.   1,000 mg Oral 3 times per day 02/19/23 0644 02/19/23 0651   02/19/23 1400  metroNIDAZOLE (FLAGYL) tablet 1,000 mg  Status:  Discontinued       See Hyperspace for full Linked Orders Report.   1,000 mg Oral 3 times per day 02/19/23 0644 02/19/23 0651   02/19/23 0645  cefoTEtan (CEFOTAN) 2 g in sodium chloride 0.9 % 100 mL IVPB        2 g 200 mL/hr over 30 Minutes Intravenous On call to O.R. 02/19/23 0644 02/19/23 0831         Assessment/Plan: Patient Active Problem List   Diagnosis Date Noted   S/P colostomy takedown 02/19/2023   Secondary hypercoagulable state ([place]) 10/22/2022   Aortic dilatation ([place]) 09/02/2022   PAF (paroxysmal atrial fibrillation) ([place]) 09/02/2022   Status post surgery 07/16/2022   Perforation of sigmoid colon due to diverticulitis 07/16/2022   History of total knee replacement, right 03/26/2018   Primary localized osteoarthritis of right knee 02/17/2018   Hyperlipidemia    Hypertension    Smoker    s/p Procedure(s): XI ROBOTIC ASSISTED COLOSTOMY REVERSAL WITH POSSIBLE LOW ANTERIOR RESECTION LYSIS OF ADHESION FLEXIBLE SIGMOIDOSCOPY CYSTOSCOPY with FIREFLY INJECTION 02/19/2023  -having presumed anastomotic bleeding -Ambulate 5x/day -Acute vs chronic kidney dysfunction undetermined- Cr trending in appropriate direction, near baseline -Soft diet -Having flatus and BM -Ppx: SCDs, hold Eliquis     LOS: 3 days   Rosario Adie, MD  [facility] [place] [facility]

## 2023-02-22 NOTE — Progress Notes (Signed)
OT Cancellation Note  Patient Details Name: Nicholas Huffman MRN: [id number] DOB: [birth year]   Cancelled Treatment:    Reason Eval/Treat Not Completed: Other (comment) Patient declined therapy reporting that he was feeling weaker since last night with bleeding when passing stool as per hospitalist notes. Patient reported he was still feeling weaker and wanted therapy to check back at another time. OT to continue to follow and check back as schedule will allow.  Rennie Plowman, [facility] Office# [phone]  02/22/2023, 12:24 PM

## 2023-02-22 NOTE — Progress Notes (Signed)
Patient having multiple and frequent  bloody BMs with red blood and some clots.  On- call Dr. Donella Stade paged. No new orders, monitor lab work. Phelobotomy called to get the blood draws for this a.m. to check CBC.

## 2023-02-23 LAB — CBC
HCT: 27.6 % — ABNORMAL LOW (ref 39.0–52.0)
Hemoglobin: 8.9 g/dL — ABNORMAL LOW (ref 13.0–17.0)
MCH: 29.8 pg (ref 26.0–34.0)
MCHC: 32.2 g/dL (ref 30.0–36.0)
MCV: 92.3 fL (ref 80.0–100.0)
Platelets: 195 10*3/uL (ref 150–400)
RBC: 2.99 MIL/uL — ABNORMAL LOW (ref 4.22–5.81)
RDW: 14.7 % (ref 11.5–15.5)
WBC: 7.7 10*3/uL (ref 4.0–10.5)
nRBC: 0 % (ref 0.0–0.2)

## 2023-02-23 MED ORDER — ENOXAPARIN SODIUM 40 MG/0.4ML IJ SOSY
40.0000 mg | PREFILLED_SYRINGE | INTRAMUSCULAR | Status: DC
  Administered 2023-02-23 – 2023-02-24 (×2): 40 mg via SUBCUTANEOUS
  Filled 2023-02-23 (×2): qty 0.4

## 2023-02-23 NOTE — Progress Notes (Signed)
  Subjective No further bloody BM's per pt.  Elliquis stopped and factor 10 given 3/15.    Objective: Vital signs in last 24 hours: Temp:  [97.7 F (36.5 C)] 97.7 F (36.5 C) (03/16 1355) Pulse Rate:  [81-106] 106 (03/17 0640) Resp:  [16-18] 16 (03/17 0640) BP: (100-156)/(55-67) 156/64 (03/17 0640) SpO2:  [98 %-99 %] 98 % (03/17 0640) Last BM Date : 02/22/23  Intake/Output from previous day: 03/16 0701 - 03/17 0700 In: 900 [P.O.:900] Out: 0  Intake/Output this shift: No intake/output data recorded.  Gen: NAD, comfortable Abd: Soft, appropriate incisional tenderness, no tenderness elsewhere, nondistended; incisions clean and dry without erythema Ext: SCDs in place  Lab Results: CBC  Recent Labs    02/22/23 0434 02/23/23 0434  WBC 9.9 7.7  HGB 10.6* 8.9*  HCT 32.9* 27.6*  PLT 223 195    BMET Recent Labs    02/21/23 0512 02/22/23 0434  NA 137 139  K 3.8 4.0  CL 104 108  CO2 25 23  GLUCOSE 97 121*  BUN 28* 33*  CREATININE 1.29* 1.30*  CALCIUM 8.8* 8.4*    PT/INR No results for input(s): "LABPROT", "INR" in the last 72 hours. ABG No results for input(s): "PHART", "HCO3" in the last 72 hours.  Invalid input(s): "PCO2", "PO2"  Studies/Results:  Anti-infectives: Anti-infectives (From admission, onward)    Start     Dose/Rate Route Frequency Ordered Stop   02/19/23 1400  neomycin (MYCIFRADIN) tablet 1,000 mg  Status:  Discontinued       See Hyperspace for full Linked Orders Report.   1,000 mg Oral 3 times per day 02/19/23 0644 02/19/23 0651   02/19/23 1400  metroNIDAZOLE (FLAGYL) tablet 1,000 mg  Status:  Discontinued       See Hyperspace for full Linked Orders Report.   1,000 mg Oral 3 times per day 02/19/23 0644 02/19/23 0651   02/19/23 0645  cefoTEtan (CEFOTAN) 2 g in sodium chloride 0.9 % 100 mL IVPB        2 g 200 mL/hr over 30 Minutes Intravenous On call to O.R. 02/19/23 0644 02/19/23 0831        Assessment/Plan: Patient Active Problem  List   Diagnosis Date Noted   S/P colostomy takedown 02/19/2023   Secondary hypercoagulable state ([place]) 10/22/2022   Aortic dilatation ([place]) 09/02/2022   PAF (paroxysmal atrial fibrillation) ([place]) 09/02/2022   Status post surgery 07/16/2022   Perforation of sigmoid colon due to diverticulitis 07/16/2022   History of total knee replacement, right 03/26/2018   Primary localized osteoarthritis of right knee 02/17/2018   Hyperlipidemia    Hypertension    Smoker    s/p Procedure(s): XI ROBOTIC ASSISTED COLOSTOMY REVERSAL WITH POSSIBLE LOW ANTERIOR RESECTION LYSIS OF ADHESION FLEXIBLE SIGMOIDOSCOPY CYSTOSCOPY with FIREFLY INJECTION 02/19/2023  -presumed anastomotic bleeding, seems to have stopped but hgb still trending down.  Most likely equilibrating, but I've recommended that we follow it for one more day -Ambulate 5x/day -Acute vs chronic kidney dysfunction undetermined- Cr trending in appropriate direction, near baseline -Soft diet  -Ppx: SCDs, hold Eliquis, SQ hep ordered    LOS: 4 days   Rosario Adie, MD  [facility]

## 2023-02-24 LAB — BASIC METABOLIC PANEL
Anion gap: 6 (ref 5–15)
BUN: 32 mg/dL — ABNORMAL HIGH (ref 8–23)
CO2: 24 mmol/L (ref 22–32)
Calcium: 8.4 mg/dL — ABNORMAL LOW (ref 8.9–10.3)
Chloride: 110 mmol/L (ref 98–111)
Creatinine, Ser: 1.12 mg/dL (ref 0.61–1.24)
GFR, Estimated: >60 mL/min (ref >60)
Glucose, Bld: 97 mg/dL (ref 70–99)
Potassium: 3.7 mmol/L (ref 3.5–5.1)
Sodium: 140 mmol/L (ref 135–145)

## 2023-02-24 LAB — CBC
HCT: 26 % — ABNORMAL LOW (ref 39.0–52.0)
Hemoglobin: 8.3 g/dL — ABNORMAL LOW (ref 13.0–17.0)
MCH: 30 pg (ref 26.0–34.0)
MCHC: 31.9 g/dL (ref 30.0–36.0)
MCV: 93.9 fL (ref 80.0–100.0)
Platelets: 199 10*3/uL (ref 150–400)
RBC: 2.77 MIL/uL — ABNORMAL LOW (ref 4.22–5.81)
RDW: 14.7 % (ref 11.5–15.5)
WBC: 7.3 10*3/uL (ref 4.0–10.5)
nRBC: 0 % (ref 0.0–0.2)

## 2023-02-24 NOTE — Progress Notes (Signed)
  Subjective No further bloody BM's per pt. Had brown stool last night. Elliquis stopped and factor 10 given 3/15.    Objective: Vital signs in last 24 hours: Temp:  [97.7 F (36.5 C)] 97.7 F (36.5 C) (03/16 1355) Pulse Rate:  [81-106] 106 (03/17 0640) Resp:  [16-18] 16 (03/17 0640) BP: (100-156)/(55-67) 156/64 (03/17 0640) SpO2:  [98 %-99 %] 98 % (03/17 0640) Last BM Date : 02/22/23  Intake/Output from previous day: 03/16 0701 - 03/17 0700 In: 900 [P.O.:900] Out: 0  Intake/Output this shift: No intake/output data recorded.  Gen: NAD, comfortable Abd: Soft, appropriate incisional tenderness, no tenderness elsewhere, nondistended Ext: SCDs in place  Lab Results: CBC  Recent Labs    02/22/23 0434 02/23/23 0434  WBC 9.9 7.7  HGB 10.6* 8.9*  HCT 32.9* 27.6*  PLT 223 195    BMET Recent Labs    02/21/23 0512 02/22/23 0434  NA 137 139  K 3.8 4.0  CL 104 108  CO2 25 23  GLUCOSE 97 121*  BUN 28* 33*  CREATININE 1.29* 1.30*  CALCIUM 8.8* 8.4*    PT/INR No results for input(s): "LABPROT", "INR" in the last 72 hours. ABG No results for input(s): "PHART", "HCO3" in the last 72 hours.  Invalid input(s): "PCO2", "PO2"  Studies/Results:  Anti-infectives: Anti-infectives (From admission, onward)    Start     Dose/Rate Route Frequency Ordered Stop   02/19/23 1400  neomycin (MYCIFRADIN) tablet 1,000 mg  Status:  Discontinued       See Hyperspace for full Linked Orders Report.   1,000 mg Oral 3 times per day 02/19/23 0644 02/19/23 0651   02/19/23 1400  metroNIDAZOLE (FLAGYL) tablet 1,000 mg  Status:  Discontinued       See Hyperspace for full Linked Orders Report.   1,000 mg Oral 3 times per day 02/19/23 0644 02/19/23 0651   02/19/23 0645  cefoTEtan (CEFOTAN) 2 g in sodium chloride 0.9 % 100 mL IVPB        2 g 200 mL/hr over 30 Minutes Intravenous On call to O.R. 02/19/23 0644 02/19/23 0831        Assessment/Plan: Patient Active Problem List   Diagnosis  Date Noted   S/P colostomy takedown 02/19/2023   Secondary hypercoagulable state ([place]) 10/22/2022   Aortic dilatation ([place]) 09/02/2022   PAF (paroxysmal atrial fibrillation) ([place]) 09/02/2022   Status post surgery 07/16/2022   Perforation of sigmoid colon due to diverticulitis 07/16/2022   History of total knee replacement, right 03/26/2018   Primary localized osteoarthritis of right knee 02/17/2018   Hyperlipidemia    Hypertension    Smoker    s/p Procedure(s): XI ROBOTIC ASSISTED COLOSTOMY REVERSAL WITH POSSIBLE LOW ANTERIOR RESECTION LYSIS OF ADHESION FLEXIBLE SIGMOIDOSCOPY CYSTOSCOPY with FIREFLY INJECTION 02/19/2023  -No further bleeding, feeling great. Highly motivated to go home. Understands things to watch out for. We have discussed waiting until 3/21 to resume Eliquis. If any bleeding to call before restarting. If bleeding were to occur following, to immediately return to ER. He expresses understanding -Ambulate 5x/day -Acute vs chronic kidney dysfunction undetermined- Cr normal now -Soft diet  -Ppx: SCDs, SQH    LOS: 4 days    Nadeen Landau, MD [facility], [place]

## 2023-02-24 NOTE — Discharge Summary (Signed)
Patient ID: Nicholas Huffman MRN: [id number] DOB/AGE: August 11, 1946 77 y.o.  Admit date: 02/19/2023 Discharge date: 02/24/2023  Discharge Diagnoses Patient Active Problem List   Diagnosis Date Noted   S/P colostomy takedown 02/19/2023   Secondary hypercoagulable state ([place]) 10/22/2022   Aortic dilatation ([place]) 09/02/2022   PAF (paroxysmal atrial fibrillation) ([place]) 09/02/2022   Status post surgery 07/16/2022   Perforation of sigmoid colon due to diverticulitis 07/16/2022   History of total knee replacement, right 03/26/2018   Primary localized osteoarthritis of right knee 02/17/2018   Hyperlipidemia    Hypertension    Smoker     Consultants None  Procedures OR 02/19/23 -  Robotic assisted low anterior resection Takedown of Colostomy (Hartmann's reversal) Small bowel resection Lysis of adhesions x 90 minutes Intraoperative assessment of perfusion using ICG fluorescence imaging Flexible sigmoidoscopy Bilateral transversus abdominus plane (TAP) blocks   Hospital Course: He was admitted postoperatively and recovered well - his Eliquis was restarerted 3/15 and developed some bright red blood per rectum, presumably anastomotic. Eliquis held and reversed with Dr. Brantley Stage. This resolved. His hemoglobin demonstrated expected drop following this but stabilized. On 02/24/23 he is tolerating a diet and highly motivated to go home. His stool is brown and no further bleeding evident. He has been instructed to wait until 02/27/23 to resume his Eliquis and knows well what to watch out for. If any bleeding prior, he is to call the office for instruction and/or be evaluated in ER.    Allergies as of 02/24/2023   No Known Allergies      Medication List     TAKE these medications    apixaban 5 MG Tabs tablet Commonly known as: ELIQUIS Take 1 tablet (5 mg total) by mouth 2 (two) times daily.   atorvastatin 40 MG tablet Commonly known as: LIPITOR TAKE 1 TABLET BY MOUTH EVERY DAY   diltiazem  120 MG 12 hr capsule Commonly known as: CARDIZEM SR Take 2 capsules (240 mg total) by mouth daily.   irbesartan 150 MG tablet Commonly known as: Avapro Take 1 tablet (150 mg total) by mouth daily.   metoprolol succinate 25 MG 24 hr tablet Commonly known as: TOPROL-XL Take 1 tablet (25 mg total) by mouth daily. Take with or immediately following a meal.   oxyCODONE 5 MG immediate release tablet Commonly known as: Roxicodone Take 1 tablet (5 mg total) by mouth every 8 (eight) hours as needed for up to 5 days (postop pain not controlled with tylenol first).          Follow-up Information     Ileana Roup, MD Follow up on 03/14/2023.   Specialties: General Surgery, Colon and Rectal Surgery Contact information: [place] [address] [place] Dema Severin, M.D. [facility]

## 2023-02-25 ENCOUNTER — Telehealth: Payer: Self-pay

## 2023-02-25 NOTE — Transitions of Care (Post Inpatient/ED Visit) (Signed)
   02/25/2023  Name: Nicholas Huffman MRN: [id number] DOB: 05/25/1946  Today's TOC FU Call Status: Today's TOC FU Call Status:: Successful TOC FU Call Competed TOC FU Call Complete Date: 02/25/23  Transition Care Management Follow-up Telephone Call Date of Discharge: 02/24/23 Discharge Facility: [facility]) Type of Discharge: Inpatient Admission Primary Inpatient Discharge Diagnosis:: bowel resection How have you been since you were released from the hospital?: Better Any questions or concerns?: No  Items Reviewed: Did you receive and understand the discharge instructions provided?: Yes Medications obtained and verified?: Yes (Medications Reviewed) Any new allergies since your discharge?: No Dietary orders reviewed?: Yes Do you have support at home?: Yes People in Home: spouse  Home Care and Equipment/Supplies: [place] Ordered?: NA Any new equipment or medical supplies ordered?: NA  Functional Questionnaire: Do you need assistance with bathing/showering or dressing?: No Do you need assistance with meal preparation?: No Do you need assistance with eating?: No Do you have difficulty maintaining continence: No Do you need assistance with getting out of bed/getting out of a chair/moving?: No Do you have difficulty managing or taking your medications?: No  Follow up appointments reviewed: PCP Follow-up appointment confirmed?: [facility] Follow-up appointment confirmed?: Yes Date of Specialist follow-up appointment?: 03/14/23 Follow-Up Specialty Provider:: Dr white Do you need transportation to your follow-up appointment?: No Do you understand care options if your condition(s) worsen?: Yes-patient verbalized understanding    [place], [place] Nurse Health Advisor Direct Dial [phone]

## 2023-03-12 NOTE — Progress Notes (Signed)
Cardiology Clinic Note   Date: 03/14/2023 ID: Nicholas Huffman, DOB 1946/03/09, MRN [id number]  Primary Cardiologist:  Olga Millers, MD  Patient Profile    Nicholas Huffman is a 77 y.o. male who presents to the clinic today for 66-month follow-up.  Past medical history significant for: PAF. Echo 07/19/2022: EF 65-70%. 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 Aortic dilatation.  Hypertension. Hyperlipidemia.  Lipid panel 04/12/2022: LDL 47, HDL 47, TG 87, total 111. Tobacco abuse.  Complete cessation since August 2023.   History of Present Illness    Nicholas Huffman was first evaluated by Dr. Jens Som on 07/19/2022 during hospitalization. Patient was admitted for abdominal pain and found to have perforated sigmoid diverticulitis/peritonitis requiring surgery. He developed asymptotic afib postoperatively in the early AM on 07/18/2022. He denied chest pain, DOE, shortness of breath, edema, orthopnea, PND, or palpitations. CHA2DS2-VASc 3. Eliquis started. He was discharged from the hospital on 07/23/2022 on metoprolol, diltiazem, and Eliquis. Patient underwent successful cardioversion on 09/12/2022. Patient was bradycardic with heart rate down to 42. Metoprolol 50 mg bid was changed to Toprol 25 mg daily. Continued on diltiazem and Eliquis.  Patient was seen in follow-up by Alphonzo Severance, PA-C on 10/22/2022.  He was back in A-fib and made decision to pursue rate control versus rhythm control.  Patient was last seen in the office by me on 12/13/2022 for cardiac risk evaluation for upcoming ostomy reversal.  Patient underwent surgery on 02/19/2023. His postop course was complicated by bleeding after he restarted Eliquis. He was kept in the hospital an extra day but did not need to undergo transfusion. Last HGB in the hospital was 8.3.  Today, patient is doing well since his surgery. Patient denies shortness of breath or dyspnea on exertion. No chest pain, pressure, or tightness. Denies lower extremity  edema, orthopnea, or PND. No palpitations. He is back to doing his normal activities walking his dogs and doing yard work. He is still following lifting restrictions set by surgeon. He has had no other bleeding issues since discharge from hospital. BP readings at home are consistently 140-150/65-80. His wife states he stopped irbesartan at some point (not sure when). She felt his BP was getting too low but she does not have readings during that time. Patient denies ever having headaches or dizziness.      ROS: All other systems reviewed and are otherwise negative except as noted in History of Present Illness.  Studies Reviewed    ECG is not ordered today.   Physical Exam    VS:  BP 118/66 (BP Location: Left Arm, Patient Position: Sitting, Cuff Size: Normal)   Pulse 70   Ht 5\' 6"  (1.676 m)   Wt 168 lb (76.2 kg)   SpO2 98%   BMI 27.12 kg/m  , BMI Body mass index is 27.12 kg/m.  GEN: Well nourished, well developed, in no acute distress. Neck: No JVD or carotid bruits. Cardiac: Irregular rhythm. No murmurs. No rubs or gallops.   Respiratory:  Respirations regular and unlabored. Clear to auscultation without rales, wheezing or rhonchi. GI: Soft, nontender, nondistended. Extremities: Radials/DP/PT 2+ and equal bilaterally. No clubbing or cyanosis. No edema bilaterally.   Skin: Warm and dry, no rash. Neuro: Strength intact.  Assessment & Plan   PAF. Patient first episode of afib post surgery for perforated sigmoid diverticulitis/peritonitis in August 2023. He underwent successful cardioversion in October 2023, however, returned to afib shortly after. He decided to purse rate control. Patient  has no cardiac awareness of afib. He had bleeding postoperatively but none since. Will get a CBC today.  Continue diltiazem, metoprolol, and Eliquis.  Hypertension. BP today 148/78 at intake and 118/66 at recheck. Patient denies headaches or dizziness. Restart irbesartan at 150 mg daily. Continue to  monitor BP. If persistently <130/80 or <100/50 contact the office. Will check BMP today and repeat in 2 weeks.  Continue diltiazem and metoprolol. Hyperlipidemia. LDL May 2023 47, at goal. Continue Lipitor.  Disposition: CBC and BMP today. Restart irbesartan 150 mg daily. Monitor BP for goal of <130/80 and >100/50. Return in 6 months or sooner as needed.          Signed, Etta Grandchild. Niani Mourer, DNP, NP-C

## 2023-03-14 ENCOUNTER — Encounter: Payer: Self-pay | Admitting: Student

## 2023-03-14 ENCOUNTER — Ambulatory Visit: Payer: Medicare Other | Attending: Student | Admitting: Student

## 2023-03-14 VITALS — BP 118/66 | HR 70 | Ht 66.0 in | Wt 168.0 lb

## 2023-03-14 DIAGNOSIS — E782 Mixed hyperlipidemia: Secondary | ICD-10-CM | POA: Diagnosis not present

## 2023-03-14 DIAGNOSIS — I480 Paroxysmal atrial fibrillation: Secondary | ICD-10-CM

## 2023-03-14 DIAGNOSIS — I10 Essential (primary) hypertension: Secondary | ICD-10-CM

## 2023-03-14 DIAGNOSIS — Z79899 Other long term (current) drug therapy: Secondary | ICD-10-CM | POA: Diagnosis not present

## 2023-03-14 NOTE — Patient Instructions (Signed)
Medication Instructions:  Your physician has recommended you make the following change in your medication:  RESTART: Irbesartan 150mg  daily  *If you need a refill on your cardiac medications before your next appointment, please call your pharmacy*  Please take your blood pressure daily for 2 weeks and send in a MyChart message. Please include heart rates. If your blood pressure is less than 130/80 but greater than 100/50 that's fine, but if you BP is continuously running higher that 130/80 or less that 100/50 please give the office a call.    HOW TO TAKE YOUR BLOOD PRESSURE: Rest 5 minutes before taking your blood pressure. Don't smoke or drink caffeinated beverages for at least 30 minutes before. Take your blood pressure before (not after) you eat. Sit comfortably with your back supported and both feet on the floor (don't cross your legs). Elevate your arm to heart level on a table or a desk. Use the proper sized cuff. It should fit smoothly and snugly around your bare upper arm. There should be enough room to slip a fingertip under the cuff. The bottom edge of the cuff should be 1 inch above the crease of the elbow. Ideally, take 3 measurements at one sitting and record the average.    Lab Work: Your physician recommends that you have the following labs drawn today: BMET and CBC. Your physician recommends that you return in 2 weeks to have a repeat lab draw: BMET  If you have labs (blood work) drawn today and your tests are completely normal, you will receive your results only by: MyChart Message (if you have MyChart) OR A paper copy in the mail If you have any lab test that is abnormal or we need to change your treatment, we will call you to review the results.   Testing/Procedures: NONE   Follow-Up: At [facility], you and your health needs are our priority.  As part of our continuing mission to provide you with exceptional heart care, we have created designated Provider  Care Teams.  These Care Teams include your primary Cardiologist (physician) and Advanced Practice Providers (APPs -  Physician Assistants and Nurse Practitioners) who all work together to provide you with the care you need, when you need it.  We recommend signing up for the patient portal called "MyChart".  Sign up information is provided on this After Visit Summary.  MyChart is used to connect with patients for Virtual Visits (Telemedicine).  Patients are able to view lab/test results, encounter notes, upcoming appointments, etc.  Non-urgent messages can be sent to your provider as well.   To learn more about what you can do with MyChart, go to [email or web address].    Your next appointment:   6 month(s)  Provider:   Olga Millers, MD Or Carlos Levering, NP

## 2023-03-15 LAB — CBC
Hematocrit: 33.4 % — ABNORMAL LOW (ref 37.5–51.0)
Hemoglobin: 10.1 g/dL — ABNORMAL LOW (ref 13.0–17.7)
MCH: 26.9 pg (ref 26.6–33.0)
MCHC: 30.2 g/dL — ABNORMAL LOW (ref 31.5–35.7)
MCV: 89 fL (ref 79–97)
Platelets: 448 10*3/uL (ref 150–450)
RBC: 3.75 x10E6/uL — ABNORMAL LOW (ref 4.14–5.80)
RDW: 14 % (ref 11.6–15.4)
WBC: 6.5 10*3/uL (ref 3.4–10.8)

## 2023-03-15 LAB — BASIC METABOLIC PANEL
BUN/Creatinine Ratio: 21 (ref 10–24)
BUN: 24 mg/dL (ref 8–27)
CO2: 22 mmol/L (ref 20–29)
Calcium: 9.4 mg/dL (ref 8.6–10.2)
Chloride: 103 mmol/L (ref 96–106)
Creatinine, Ser: 1.16 mg/dL (ref 0.76–1.27)
Glucose: 86 mg/dL (ref 70–99)
Potassium: 4.9 mmol/L (ref 3.5–5.2)
Sodium: 138 mmol/L (ref 134–144)
eGFR: 65 mL/min/{1.73_m2} (ref >59)

## 2023-03-28 ENCOUNTER — Other Ambulatory Visit: Payer: Self-pay

## 2023-03-28 DIAGNOSIS — Z79899 Other long term (current) drug therapy: Secondary | ICD-10-CM

## 2023-03-29 LAB — BASIC METABOLIC PANEL
BUN/Creatinine Ratio: 23 (ref 10–24)
BUN: 28 mg/dL — ABNORMAL HIGH (ref 8–27)
CO2: 22 mmol/L (ref 20–29)
Calcium: 9.4 mg/dL (ref 8.6–10.2)
Chloride: 104 mmol/L (ref 96–106)
Creatinine, Ser: 1.2 mg/dL (ref 0.76–1.27)
Glucose: 141 mg/dL — ABNORMAL HIGH (ref 70–99)
Potassium: 4.6 mmol/L (ref 3.5–5.2)
Sodium: 138 mmol/L (ref 134–144)
eGFR: 63 mL/min/{1.73_m2} (ref >59)

## 2023-05-29 ENCOUNTER — Other Ambulatory Visit: Payer: Self-pay | Admitting: Student

## 2023-05-30 ENCOUNTER — Telehealth: Payer: Self-pay

## 2023-05-30 NOTE — Telephone Encounter (Signed)
Prescription Request  05/30/2023  LOV: 07/21/22  What is the name of the medication or equipment? amLODipine (NORVASC) 10 MG tablet [[id number]]  DISCONTINUED  Have you contacted your pharmacy to request a refill? No   Which pharmacy would you like this sent to?  [id number] - [place], [place] - [address] AT [address]NER OF [facility] [address] [place] [address] Phone: [phone] Fax: [phone]    Patient notified that their request is being sent to the clinical staff for review and that they should receive a response within 2 business days.   Please advise at [facility] [phone]

## 2023-05-30 NOTE — Telephone Encounter (Signed)
amLODipine (NORVASC) 10 MG tablet is not on current list. Routing for approval.

## 2023-06-02 ENCOUNTER — Telehealth: Payer: Self-pay | Admitting: Cardiology

## 2023-06-02 NOTE — Telephone Encounter (Signed)
Pt c/o medication issue:  1. Name of Medication: diltiazem (CARDIZEM SR) 120 MG 12 hr capsule   2. How are you currently taking this medication (dosage and times per day)? As prescribed   3. Are you having a reaction (difficulty breathing--STAT)?   4. What is your medication issue? Patient's wife is requesting call back for this medication. She was told by pharmacy that this could not be called in. Requesting call back to discuss.

## 2023-06-02 NOTE — Telephone Encounter (Signed)
Okay to refill. I do not see any reason not to refill.  Thank you! DW

## 2023-06-02 NOTE — Telephone Encounter (Signed)
Spoke to the pt wife Tyler Aas (DPR), pt is currently Cardiazem 120 mg he was advised by the pharmacy that Sharlene Dory, NP is not accepting any refills. Pt had OV on 03/14/23 with APP medication list was updated at that time. Will forward to APP for advise

## 2023-06-03 ENCOUNTER — Other Ambulatory Visit: Payer: Self-pay

## 2023-06-03 MED ORDER — DILTIAZEM HCL ER 120 MG PO CP12: 240.0000 mg | ORAL_CAPSULE | Freq: Every day | ORAL | 2 refills | Status: DC

## 2023-06-03 NOTE — Telephone Encounter (Signed)
Rx(s) sent to pharmacy electronically.  Patient notified and voiced understanding.  

## 2023-06-03 NOTE — Telephone Encounter (Signed)
Rx(s) sent to pharmacy electronically.  

## 2023-06-04 ENCOUNTER — Other Ambulatory Visit: Payer: Self-pay | Admitting: *Deleted

## 2023-06-04 DIAGNOSIS — I480 Paroxysmal atrial fibrillation: Secondary | ICD-10-CM

## 2023-06-04 MED ORDER — APIXABAN 5 MG PO TABS: 5.0000 mg | ORAL_TABLET | Freq: Two times a day (BID) | ORAL | 1 refills | Status: DC

## 2023-06-04 NOTE — Telephone Encounter (Signed)
Prescription refill request for Eliquis received. Indication: PAF Last office visit: 03/14/23  D Wittenborn NP Scr: 1.20 on 03/28/23  Epic Age: 77 Weight: 76.2kg  Based on above findings Eliquis 5mg  twice daily is the appropriate dose.  Refill approved.

## 2023-06-04 NOTE — Telephone Encounter (Signed)
Eliquis 5mg  refill request received. Patient is 77 years old, weight-76.2kg, Crea-1.20 on 03/28/23, Diagnosis-Afib, and last seen by Carlos Levering on 03/14/23. Dose is appropriate based on dosing criteria. Will send in refill to requested pharmacy.

## 2023-07-07 ENCOUNTER — Telehealth: Payer: Self-pay | Admitting: Family Medicine

## 2023-07-07 NOTE — Telephone Encounter (Signed)
Prescription Request  07/07/2023  LOV: 08/08/2022  What is the name of the medication or equipment?   amLODipine (NORVASC) 10 MG tablet [[id number]]  DISCONTINUED  **New script needed**  Have you contacted your pharmacy to request a refill? Yes   Which pharmacy would you like this sent to?  [id number] - [place], [place] - [address] AT [address] OF [facility] [address] [place] [address] Phone: [phone] Fax: [phone]    Patient notified that their request is being sent to the clinical staff for review and that they should receive a response within 2 business days.   Please advise pharmacist.

## 2023-07-09 ENCOUNTER — Telehealth: Payer: Self-pay | Admitting: Family Medicine

## 2023-07-09 NOTE — Telephone Encounter (Signed)
Prescription Request  07/09/2023  LOV: 08/08/2022  What is the name of the medication or equipment?   amLODipine (NORVASC) 10 MG tablet [[id number]]  DISCONTINUED  **90 day script requested**  Have you contacted your pharmacy to request a refill? Yes   Which pharmacy would you like this sent to?  [id number] - [place], [place] - [address] AT [address]NER OF [facility] [address] [place] [address] Phone: [phone] Fax: [phone]    Patient notified that their request is being sent to the clinical staff for review and that they should receive a response within 2 business days.   Please advise pharmacist.

## 2023-07-10 ENCOUNTER — Other Ambulatory Visit: Payer: Self-pay | Admitting: Family Medicine

## 2023-07-10 NOTE — Telephone Encounter (Signed)
Tried to call pt re med refill. LVM for pt to call back.

## 2023-07-11 ENCOUNTER — Ambulatory Visit: Payer: Medicare Other | Admitting: Family Medicine

## 2023-07-11 VITALS — BP 142/86 | HR 60 | Temp 98.0°F | Ht 66.0 in | Wt 174.6 lb

## 2023-07-11 DIAGNOSIS — Z125 Encounter for screening for malignant neoplasm of prostate: Secondary | ICD-10-CM | POA: Diagnosis not present

## 2023-07-11 DIAGNOSIS — I4891 Unspecified atrial fibrillation: Secondary | ICD-10-CM | POA: Diagnosis not present

## 2023-07-11 DIAGNOSIS — I10 Essential (primary) hypertension: Secondary | ICD-10-CM | POA: Diagnosis not present

## 2023-07-11 DIAGNOSIS — E7800 Pure hypercholesterolemia, unspecified: Secondary | ICD-10-CM

## 2023-07-11 DIAGNOSIS — F17200 Nicotine dependence, unspecified, uncomplicated: Secondary | ICD-10-CM

## 2023-07-11 DIAGNOSIS — Z122 Encounter for screening for malignant neoplasm of respiratory organs: Secondary | ICD-10-CM

## 2023-07-11 DIAGNOSIS — R0609 Other forms of dyspnea: Secondary | ICD-10-CM

## 2023-07-11 LAB — CBC WITH DIFFERENTIAL/PLATELET
Absolute Monocytes: 552 cells/uL (ref 200–950)
Basophils Absolute: 104 cells/uL (ref 0–200)
Basophils Relative: 1.5 %
Eosinophils Absolute: 242 cells/uL (ref 15–500)
Eosinophils Relative: 3.5 %
HCT: 35.3 % — ABNORMAL LOW (ref 38.5–50.0)
Hemoglobin: 10.3 g/dL — ABNORMAL LOW (ref 13.2–17.1)
Lymphs Abs: 1366 cells/uL (ref 850–3900)
MCH: 22.1 pg — ABNORMAL LOW (ref 27.0–33.0)
MCHC: 29.2 g/dL — ABNORMAL LOW (ref 32.0–36.0)
MCV: 75.8 fL — ABNORMAL LOW (ref 80.0–100.0)
MPV: 10.1 fL (ref 7.5–12.5)
Monocytes Relative: 8 %
Neutro Abs: 4637 cells/uL (ref 1500–7800)
Neutrophils Relative %: 67.2 %
Platelets: 343 10*3/uL (ref 140–400)
RBC: 4.66 10*6/uL (ref 4.20–5.80)
RDW: 17.4 % — ABNORMAL HIGH (ref 11.0–15.0)
Total Lymphocyte: 19.8 %
WBC: 6.9 10*3/uL (ref 3.8–10.8)

## 2023-07-11 NOTE — Progress Notes (Signed)
Subjective:    Patient ID: Nicholas Huffman, male    DOB: 06/01/1946, 77 y.o.   MRN: [id number]  Patient had colostomy revision 02/19/23.  Had echo 8/23:  1. Left ventricular ejection fraction, by estimation, is 65 to 70%. The  left ventricle has normal function. The left ventricle has no regional  wall motion abnormalities. Left ventricular diastolic function could not  be evaluated.   2. Right ventricular systolic function is normal. The right ventricular  size is normal. Tricuspid regurgitation signal is inadequate for assessing  PA pressure.   3. The mitral valve is normal in structure. No evidence of mitral valve  regurgitation. No evidence of mitral stenosis.   4. The aortic valve was not well visualized. Aortic valve regurgitation  is not visualized. No aortic stenosis is present.   5. Aortic dilatation noted. There is mild dilatation of the aortic root,  measuring 38 mm.   Patient has a longstanding history of smoking.  He quit less than a year ago.  He now reports dyspnea on exertion.  He denies any angina.  He denies any chest pressure.  However he reports feeling extremely winded with very little activity Past Medical History:  Diagnosis Date   A-fib (HCC)    Cancer (HCC)    basal cell   Cataract    Diverticulitis    Fractured skull (HCC)    as a child   Hyperlipidemia    Hypertension    Primary localized osteoarthritis of right knee    hx of- knee has been replaced   Smoker    Wears glasses    Wears hearing aid in both ears    Past Surgical History:  Procedure Laterality Date   BASAL CELL CARCINOMA EXCISION  2016   face   CARDIOVERSION N/A 09/12/2022   Procedure: CARDIOVERSION;  Surgeon: Jake Bathe, MD;  Location: [facility];  Service: Cardiovascular;  Laterality: N/A;   CATARACT EXTRACTION W/ INTRAOCULAR LENS  IMPLANT, BILATERAL     COLONOSCOPY  2008, 2002   hx polyps/Dr. Russella Dar   EYE SURGERY     retina detachment repair x 2   FLEXIBLE SIGMOIDOSCOPY N/A  02/19/2023   Procedure: FLEXIBLE SIGMOIDOSCOPY;  Surgeon: Andria Meuse, MD;  Location: [facility];  Service: General;  Laterality: N/A;   KNEE ARTHROSCOPY  April/2013   left   LAPAROTOMY N/A 07/16/2022   Procedure: EXPLORATORY LAPAROTOMY, HARTMAN'S PROCEDURE;  Surgeon: Gaynelle Adu, MD;  Location: [facility] OR;  Service: General;  Laterality: N/A;   LYSIS OF ADHESION N/A 02/19/2023   Procedure: LYSIS OF ADHESION;  Surgeon: Andria Meuse, MD;  Location: [facility];  Service: General;  Laterality: N/A;   TOTAL KNEE ARTHROPLASTY Right 03/26/2018   Procedure: RIGHT TOTAL KNEE ARTHROPLASTY;  Surgeon: Tarry Kos, MD;  Location: [facility];  Service: Orthopedics;  Laterality: Right;   XI ROBOTIC ASSISTED COLOSTOMY TAKEDOWN N/A 02/19/2023   Procedure: XI ROBOTIC ASSISTED COLOSTOMY REVERSAL WITH POSSIBLE LOW ANTERIOR RESECTION;  Surgeon: Andria Meuse, MD;  Location: [facility];  Service: General;  Laterality: N/A;   Current Outpatient Medications on File Prior to Visit  Medication Sig Dispense Refill   apixaban (ELIQUIS) 5 MG TABS tablet Take 1 tablet (5 mg total) by mouth 2 (two) times daily. 180 tablet 1   atorvastatin (LIPITOR) 40 MG tablet TAKE 1 TABLET BY MOUTH EVERY DAY 90 tablet 3   diltiazem (CARDIZEM SR) 120 MG 12 hr capsule Take 2 capsules (240 mg total) by  mouth daily. 180 capsule 2   irbesartan (AVAPRO) 150 MG tablet TAKE 1 TABLET BY MOUTH EVERY DAY 90 tablet 1   metoprolol succinate (TOPROL-XL) 25 MG 24 hr tablet Take 1 tablet (25 mg total) by mouth daily. Take with or immediately following a meal. 90 tablet 3   No current facility-administered medications on file prior to visit.   No Known Allergies Social History   Socioeconomic History   Marital status: Married    Spouse name: Not on file   Number of children: Not on file   Years of education: Not on file   Highest education level: Not on file  Occupational History   Not on file  Tobacco Use   Smoking status: Former    Current  packs/day: 0.00    Average packs/day: 1 pack/day for 60.0 years (60.0 ttl pk-yrs)    Types: Cigarettes    Start date: 07/09/1962    Quit date: 07/09/2022    Years since quitting: 1.0   Smokeless tobacco: Never   Tobacco comments:    pt to call PCP for patch  Vaping Use   Vaping status: Never Used  Substance and Sexual Activity   Alcohol use: No   Drug use: No   Sexual activity: Not on file  Other Topics Concern   Not on file  Social History Narrative   Not on file   Social Determinants of Health   Financial Resource Strain: Not on file  Food Insecurity: No Food Insecurity (02/19/2023)   Hunger Vital Sign    Worried About Running Out of Food in the Last Year: Never true    Ran Out of Food in the Last Year: Never true  Transportation Needs: No Transportation Needs (02/19/2023)   PRAPARE - Administrator, Civil Service (Medical): No    Lack of Transportation (Non-Medical): No  Physical Activity: Not on file  Stress: Not on file  Social Connections: Not on file  Intimate Partner Violence: Unknown (02/19/2023)   Humiliation, Afraid, Rape, and Kick questionnaire    Fear of Current or Ex-Partner: Patient declined    Emotionally Abused: No    Physically Abused: No    Sexually Abused: No      Review of Systems  All other systems reviewed and are negative.      Objective:   Physical Exam Vitals reviewed.  Constitutional:      General: He is not in acute distress.    Appearance: He is well-developed. He is not diaphoretic.  HENT:     Head: Normocephalic and atraumatic.     Right Ear: External ear normal.     Left Ear: External ear normal.     Nose: Nose normal.     Mouth/Throat:     Pharynx: No oropharyngeal exudate.  Eyes:     Conjunctiva/sclera: Conjunctivae normal.  Cardiovascular:     Rate and Rhythm: Normal rate. Rhythm irregular.     Heart sounds: Normal heart sounds. No murmur heard. Pulmonary:     Effort: Pulmonary effort is normal. No respiratory  distress.     Breath sounds: Rales present. No wheezing.  Abdominal:     General: Bowel sounds are normal. There is no distension.     Palpations: Abdomen is soft.     Tenderness: There is no abdominal tenderness. There is no rebound.  Musculoskeletal:     Cervical back: Neck supple.     Right lower leg: No edema.     Left lower leg:  No edema.  Lymphadenopathy:     Cervical: No cervical adenopathy.  Skin:    Findings: No erythema or rash.           Assessment & Plan:  Atrial fibrillation, unspecified type (HCC) - Plan: CBC with Differential/Platelet, COMPLETE METABOLIC PANEL WITH GFR, Lipid panel  Benign essential HTN - Plan: CBC with Differential/Platelet, COMPLETE METABOLIC PANEL WITH GFR, Lipid panel  Pure hypercholesterolemia - Plan: CBC with Differential/Platelet, COMPLETE METABOLIC PANEL WITH GFR, Lipid panel  Prostate cancer screening - Plan: PSA  Smoker  Dyspnea on exertion - Plan: Ambulatory referral to Cardiology  Screening for lung cancer - Plan: CT CHEST LUNG CA SCREEN LOW DOSE W/O CM Begin obtaining baseline lab work including CBC CMP lipid panel and PSA to screen for prostate cancer.  I like to see his LDL cholesterol well below 100.  Blood pressure today is borderline.  Recommended getting a CT scan of the chest to screen for lung cancer.  Try the patient on Breztri 2 puffs twice daily for possible emphysema to see if this helps with shortness of breath.  Meanwhile consult cardiology for possible stress test to rule out ischemic heart disease given the patient's risk factors

## 2023-07-11 NOTE — Telephone Encounter (Signed)
Called spoke w/pt's wife, is aware why his refill request has been denied per pcp's msg. Wife voiced understanding and nothing further.

## 2023-07-14 ENCOUNTER — Other Ambulatory Visit: Payer: Self-pay

## 2023-07-14 DIAGNOSIS — I4891 Unspecified atrial fibrillation: Secondary | ICD-10-CM

## 2023-07-14 NOTE — Addendum Note (Signed)
Addended by: Arta Silence on: 07/14/2023 04:31 PM   Modules accepted: Orders

## 2023-07-15 ENCOUNTER — Other Ambulatory Visit: Payer: Medicare Other

## 2023-07-15 DIAGNOSIS — I4891 Unspecified atrial fibrillation: Secondary | ICD-10-CM

## 2023-07-17 ENCOUNTER — Inpatient Hospital Stay: Admission: RE | Admit: 2023-07-17 | Payer: Medicare Other | Source: Ambulatory Visit

## 2023-07-17 ENCOUNTER — Other Ambulatory Visit: Payer: Medicare Other

## 2023-07-17 ENCOUNTER — Other Ambulatory Visit: Payer: Self-pay

## 2023-07-17 MED ORDER — FERROUS SULFATE 325 (65 FE) MG PO TABS: 325.0000 mg | ORAL_TABLET | Freq: Every day | ORAL | 1 refills | Status: DC

## 2023-07-21 ENCOUNTER — Other Ambulatory Visit: Payer: Medicare Other

## 2023-07-21 DIAGNOSIS — Z122 Encounter for screening for malignant neoplasm of respiratory organs: Secondary | ICD-10-CM

## 2023-07-21 NOTE — Progress Notes (Signed)
HPI: Follow-up atrial fibrillation.  Abdominal ultrasound April 2021 showed no aneurysm.  Patient admitted August 2023 with perforated sigmoid diverticulitis/peritonitis requiring surgical intervention.  He developed atrial fibrillation postoperatively.  Echocardiogram August 2023 showed normal LV function.  Following discharge he underwent successful cardioversion September 12, 2022.  However at time of follow-up in November 2023 he had developed recurrent atrial fibrillation and rate control was pursued.  Since last seen he notes some dyspnea on exertion.  No orthopnea, PND, pedal edema, chest pain or syncope.  Current Outpatient Medications  Medication Sig Dispense Refill   apixaban (ELIQUIS) 5 MG TABS tablet Take 1 tablet (5 mg total) by mouth 2 (two) times daily. 180 tablet 1   atorvastatin (LIPITOR) 40 MG tablet TAKE 1 TABLET BY MOUTH EVERY DAY 90 tablet 3   Budeson-Glycopyrrol-Formoterol (BREZTRI AEROSPHERE) 160-9-4.8 MCG/ACT AERO Inhale 2 puffs into the lungs 2 (two) times daily. Two wee trial     diltiazem (CARDIZEM SR) 120 MG 12 hr capsule Take 2 capsules (240 mg total) by mouth daily. 180 capsule 2   ferrous sulfate 325 (65 FE) MG tablet Take 1 tablet (325 mg total) by mouth daily with breakfast. 90 tablet 1   irbesartan (AVAPRO) 150 MG tablet TAKE 1 TABLET BY MOUTH EVERY DAY 90 tablet 1   metoprolol succinate (TOPROL-XL) 25 MG 24 hr tablet Take 1 tablet (25 mg total) by mouth daily. Take with or immediately following a meal. 90 tablet 3   No current facility-administered medications for this visit.     Past Medical History:  Diagnosis Date   A-fib (HCC)    Cancer (HCC)    basal cell   Cataract    Diverticulitis    Fractured skull (HCC)    as a child   Hyperlipidemia    Hypertension    Primary localized osteoarthritis of right knee    hx of- knee has been replaced   Smoker    Wears glasses    Wears hearing aid in both ears     Past Surgical History:  Procedure  Laterality Date   BASAL CELL CARCINOMA EXCISION  2016   face   CARDIOVERSION N/A 09/12/2022   Procedure: CARDIOVERSION;  Surgeon: Jake Bathe, MD;  Location: [facility];  Service: Cardiovascular;  Laterality: N/A;   CATARACT EXTRACTION W/ INTRAOCULAR LENS  IMPLANT, BILATERAL     COLONOSCOPY  2008, 2002   hx polyps/Dr. Russella Dar   EYE SURGERY     retina detachment repair x 2   FLEXIBLE SIGMOIDOSCOPY N/A 02/19/2023   Procedure: FLEXIBLE SIGMOIDOSCOPY;  Surgeon: Andria Meuse, MD;  Location: [facility];  Service: General;  Laterality: N/A;   KNEE ARTHROSCOPY  April/2013   left   LAPAROTOMY N/A 07/16/2022   Procedure: EXPLORATORY LAPAROTOMY, HARTMAN'S PROCEDURE;  Surgeon: Gaynelle Adu, MD;  Location: [facility] OR;  Service: General;  Laterality: N/A;   LYSIS OF ADHESION N/A 02/19/2023   Procedure: LYSIS OF ADHESION;  Surgeon: Andria Meuse, MD;  Location: [facility];  Service: General;  Laterality: N/A;   TOTAL KNEE ARTHROPLASTY Right 03/26/2018   Procedure: RIGHT TOTAL KNEE ARTHROPLASTY;  Surgeon: Tarry Kos, MD;  Location: [facility];  Service: Orthopedics;  Laterality: Right;   XI ROBOTIC ASSISTED COLOSTOMY TAKEDOWN N/A 02/19/2023   Procedure: XI ROBOTIC ASSISTED COLOSTOMY REVERSAL WITH POSSIBLE LOW ANTERIOR RESECTION;  Surgeon: Andria Meuse, MD;  Location: [facility];  Service: General;  Laterality: N/A;    Social History   Socioeconomic  History   Marital status: Married    Spouse name: Not on file   Number of children: Not on file   Years of education: Not on file   Highest education level: Not on file  Occupational History   Not on file  Tobacco Use   Smoking status: Former    Current packs/day: 0.00    Average packs/day: 1 pack/day for 60.0 years (60.0 ttl pk-yrs)    Types: Cigarettes    Start date: 07/09/1962    Quit date: 07/09/2022    Years since quitting: 1.0   Smokeless tobacco: Never   Tobacco comments:    pt to call PCP for patch  Vaping Use   Vaping status: Never Used   Substance and Sexual Activity   Alcohol use: No   Drug use: No   Sexual activity: Not on file  Other Topics Concern   Not on file  Social History Narrative   Not on file   Social Determinants of Health   Financial Resource Strain: Not on file  Food Insecurity: No Food Insecurity (02/19/2023)   Hunger Vital Sign    Worried About Running Out of Food in the Last Year: Never true    Ran Out of Food in the Last Year: Never true  Transportation Needs: No Transportation Needs (02/19/2023)   PRAPARE - Administrator, Civil Service (Medical): No    Lack of Transportation (Non-Medical): No  Physical Activity: Not on file  Stress: Not on file  Social Connections: Not on file  Intimate Partner Violence: Unknown (02/19/2023)   Humiliation, Afraid, Rape, and Kick questionnaire    Fear of Current or Ex-Partner: Patient declined    Emotionally Abused: No    Physically Abused: No    Sexually Abused: No    Family History  Problem Relation Age of Onset   Cancer Mother    Breast cancer Mother    Cancer Father    Colon cancer Neg Hx    Esophageal cancer Neg Hx    Rectal cancer Neg Hx    Stomach cancer Neg Hx     ROS: no fevers or chills, productive cough, hemoptysis, dysphasia, odynophagia, melena, hematochezia, dysuria, hematuria, rash, seizure activity, orthopnea, PND, pedal edema, claudication. Remaining systems are negative.  Physical Exam: Well-developed well-nourished in no acute distress.  Skin is warm and dry.  HEENT is normal.  Neck is supple.  Chest is clear to auscultation with normal expansion.  Cardiovascular exam is irregullar Abdominal exam nontender or distended. No masses palpated. Extremities show no edema. neuro grossly intact  EKG Interpretation Date/Time:  Tuesday July 29 2023 09:49:32 EDT Ventricular Rate:  72 PR Interval:    QRS Duration:  100 QT Interval:  388 QTC Calculation: 424 R Axis:   23  Text Interpretation: Atrial fibrillation  When compared with ECG of 22-Oct-2022 10:16, No significant change was found Confirmed by Olga Millers ([id number]) on 07/29/2023 9:56:11 AM    A/P  1 persistent atrial fibrillation-patient is status post cardioversion in October 2023 but developed recurrent atrial fibrillation.  He remains asymptomatic and plan is rate control/anticoagulation.  Continue Cardizem, metoprolol and Eliquis.  Note he has had recent mild microcytic anemia.  He is scheduled to see gastroenterology.  If bleeding becomes an issue we discussed the possibility of a watchman.  2 hypertension-patient's blood pressure is elevated.  Would continue to follow and we can advance medications if needed.  3 hyperlipidemia-continue statin.  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  His primary care physician is concerned about the potential for coronary artery disease contributing.  I will arrange a Lexiscan nuclear study to rule out ischemia.  Olga Millers, MD

## 2023-07-24 LAB — FECAL GLOBIN BY IMMUNOCHEMISTRY
FECAL GLOBIN RESULT:: DETECTED — AB
MICRO NUMBER:: 15329621

## 2023-07-25 ENCOUNTER — Other Ambulatory Visit: Payer: Self-pay

## 2023-07-25 DIAGNOSIS — R195 Other fecal abnormalities: Secondary | ICD-10-CM

## 2023-07-25 DIAGNOSIS — K5720 Diverticulitis of large intestine with perforation and abscess without bleeding: Secondary | ICD-10-CM

## 2023-07-29 ENCOUNTER — Encounter (HOSPITAL_COMMUNITY): Payer: Self-pay

## 2023-07-29 ENCOUNTER — Encounter: Payer: Self-pay | Admitting: Cardiology

## 2023-07-29 ENCOUNTER — Ambulatory Visit: Payer: Medicare Other | Attending: Cardiology | Admitting: Cardiology

## 2023-07-29 VITALS — BP 160/74 | HR 72 | Ht 66.0 in | Wt 174.8 lb

## 2023-07-29 DIAGNOSIS — R0609 Other forms of dyspnea: Secondary | ICD-10-CM | POA: Diagnosis not present

## 2023-07-29 DIAGNOSIS — I10 Essential (primary) hypertension: Secondary | ICD-10-CM | POA: Diagnosis not present

## 2023-07-29 DIAGNOSIS — I77819 Aortic ectasia, unspecified site: Secondary | ICD-10-CM

## 2023-07-29 DIAGNOSIS — I480 Paroxysmal atrial fibrillation: Secondary | ICD-10-CM | POA: Diagnosis not present

## 2023-07-29 NOTE — Patient Instructions (Signed)
    Testing/Procedures:  Your physician has requested that you have a lexiscan myoview. For further information please visit [email or web address] Please follow instruction sheet, as given. [address]   Follow-Up: At [facility], you and your health needs are our priority.  As part of our continuing mission to provide you with exceptional heart care, we have created designated Provider Care Teams.  These Care Teams include your primary Cardiologist (physician) and Advanced Practice Providers (APPs -  Physician Assistants and Nurse Practitioners) who all work together to provide you with the care you need, when you need it.  We recommend signing up for the patient portal called "MyChart".  Sign up information is provided on this After Visit Summary.  MyChart is used to connect with patients for Virtual Visits (Telemedicine).  Patients are able to view lab/test results, encounter notes, upcoming appointments, etc.  Non-urgent messages can be sent to your provider as well.   To learn more about what you can do with MyChart, go to [email or web address].    Your next appointment:   6 month(s)  Provider:   Olga Millers, MD

## 2023-08-01 ENCOUNTER — Ambulatory Visit (HOSPITAL_COMMUNITY): Payer: Medicare Other | Attending: Cardiology

## 2023-08-01 DIAGNOSIS — R0609 Other forms of dyspnea: Secondary | ICD-10-CM | POA: Diagnosis not present

## 2023-08-01 LAB — MYOCARDIAL PERFUSION IMAGING
LV dias vol: 132 mL (ref 62–150)
LV sys vol: 65 mL
Nuc Stress EF: 51 %
Peak HR: 99 {beats}/min
Rest HR: 67 {beats}/min
Rest Nuclear Isotope Dose: 10.2 mCi
SDS: 1
SRS: 2
SSS: 3
ST Depression (mm): 0 mm
Stress Nuclear Isotope Dose: 31.3 mCi
TID: 1.1

## 2023-08-01 MED ORDER — TECHNETIUM TC 99M TETROFOSMIN IV KIT
31.3000 | PACK | Freq: Once | INTRAVENOUS | Status: AC | PRN
  Administered 2023-08-01: 31.3 via INTRAVENOUS

## 2023-08-01 MED ORDER — TECHNETIUM TC 99M TETROFOSMIN IV KIT
10.2000 | PACK | Freq: Once | INTRAVENOUS | Status: AC | PRN
  Administered 2023-08-01: 10.2 via INTRAVENOUS

## 2023-08-01 MED ORDER — REGADENOSON 0.4 MG/5ML IV SOLN
0.4000 mg | Freq: Once | INTRAVENOUS | Status: AC
  Administered 2023-08-01: 0.4 mg via INTRAVENOUS

## 2023-08-25 ENCOUNTER — Ambulatory Visit: Payer: Medicare Other | Admitting: Family Medicine

## 2023-08-25 VITALS — BP 132/80 | HR 77 | Temp 97.6°F | Ht 66.0 in | Wt 177.0 lb

## 2023-08-25 DIAGNOSIS — R0609 Other forms of dyspnea: Secondary | ICD-10-CM | POA: Diagnosis not present

## 2023-08-25 DIAGNOSIS — R195 Other fecal abnormalities: Secondary | ICD-10-CM | POA: Diagnosis not present

## 2023-08-25 LAB — CBC WITH DIFFERENTIAL/PLATELET
Absolute Monocytes: 313 {cells}/uL (ref 200–950)
Basophils Absolute: 59 {cells}/uL (ref 0–200)
Basophils Relative: 1 %
Eosinophils Absolute: 201 {cells}/uL (ref 15–500)
Eosinophils Relative: 3.4 %
HCT: 44.5 % (ref 38.5–50.0)
Hemoglobin: 13.9 g/dL (ref 13.2–17.1)
Lymphs Abs: 1310 {cells}/uL (ref 850–3900)
MCH: 26.1 pg — ABNORMAL LOW (ref 27.0–33.0)
MCHC: 31.2 g/dL — ABNORMAL LOW (ref 32.0–36.0)
MCV: 83.6 fL (ref 80.0–100.0)
MPV: 10.4 fL (ref 7.5–12.5)
Monocytes Relative: 5.3 %
Neutro Abs: 4018 {cells}/uL (ref 1500–7800)
Neutrophils Relative %: 68.1 %
Platelets: 262 10*3/uL (ref 140–400)
RBC: 5.32 10*6/uL (ref 4.20–5.80)
RDW: 21.6 % — ABNORMAL HIGH (ref 11.0–15.0)
Total Lymphocyte: 22.2 %
WBC: 5.9 10*3/uL (ref 3.8–10.8)

## 2023-08-25 LAB — BASIC METABOLIC PANEL WITH GFR
BUN/Creatinine Ratio: 20 (calc) (ref 6–22)
BUN: 26 mg/dL — ABNORMAL HIGH (ref 7–25)
CO2: 27 mmol/L (ref 20–32)
Calcium: 9.5 mg/dL (ref 8.6–10.3)
Chloride: 101 mmol/L (ref 98–110)
Creat: 1.33 mg/dL — ABNORMAL HIGH (ref 0.70–1.28)
Glucose, Bld: 189 mg/dL — ABNORMAL HIGH (ref 65–99)
Potassium: 4.3 mmol/L (ref 3.5–5.3)
Sodium: 136 mmol/L (ref 135–146)
eGFR: 55 mL/min/{1.73_m2} — ABNORMAL LOW (ref >=60)

## 2023-08-25 LAB — IRON: Iron: 121 ug/dL (ref 50–180)

## 2023-08-25 MED ORDER — BREZTRI AEROSPHERE 160-9-4.8 MCG/ACT IN AERO: 2.0000 | INHALATION_SPRAY | Freq: Two times a day (BID) | RESPIRATORY_TRACT | 11 refills | Status: DC

## 2023-08-25 NOTE — Progress Notes (Signed)
Subjective:    Patient ID: Nicholas Huffman, male    DOB: Nov 15, 1946, 77 y.o.   MRN: [id number] 07/11/23 Patient had colostomy revision 02/19/23.  Had echo 8/23:  1. Left ventricular ejection fraction, by estimation, is 65 to 70%. The  left ventricle has normal function. The left ventricle has no regional  wall motion abnormalities. Left ventricular diastolic function could not  be evaluated.   2. Right ventricular systolic function is normal. The right ventricular  size is normal. Tricuspid regurgitation signal is inadequate for assessing  PA pressure.   3. The mitral valve is normal in structure. No evidence of mitral valve  regurgitation. No evidence of mitral stenosis.   4. The aortic valve was not well visualized. Aortic valve regurgitation  is not visualized. No aortic stenosis is present.   5. Aortic dilatation noted. There is mild dilatation of the aortic root,  measuring 38 mm.   Patient has a longstanding history of smoking.  He quit less than a year ago.  He now reports dyspnea on exertion.  He denies any angina.  He denies any chest pressure.  However he reports feeling extremely winded with very little activity.  At that time, my plan was: Begin obtaining baseline lab work including CBC CMP lipid panel and PSA to screen for prostate cancer.  I like to see his LDL cholesterol well below 100.  Blood pressure today is borderline.  Recommended getting a CT scan of the chest to screen for lung cancer.  Try the patient on Breztri 2 puffs twice daily for possible emphysema to see if this helps with shortness of breath.  Meanwhile consult cardiology for possible stress test to rule out ischemic heart disease given the patient's risk factors  08/25/23 Patient was anemic with hgb of 10.3 and iron level of 23.  Blood was present in his stool and we recommended a GI consult.  Colonoscopy last year (7/23) was negative for malignancy.  Saw cardiology.  Stress test was negative for ischemia.  CT of  the lung showed no lung cancer but did show COPD.  Patient is here today for follow-up.  He states that he did not see a significant benefit from [facility] while he was on the medication.  He took it for approximately 2 weeks.  After he stopped the medication however he believes he saw a difference and he would like to try this again.  He denies seeing any blood in his stool.  He is consistently taking the iron pills.  He denies any epigastric pain, nausea, heartburn, vomiting. Past Medical History:  Diagnosis Date   A-fib (HCC)    Cancer (HCC)    basal cell   Cataract    Diverticulitis    Fractured skull (HCC)    as a child   Hyperlipidemia    Hypertension    Primary localized osteoarthritis of right knee    hx of- knee has been replaced   Smoker    Wears glasses    Wears hearing aid in both ears    Past Surgical History:  Procedure Laterality Date   BASAL CELL CARCINOMA EXCISION  2016   face   CARDIOVERSION N/A 09/12/2022   Procedure: CARDIOVERSION;  Surgeon: Jake Bathe, MD;  [facility]: [facility];  Service: Cardiovascular;  Laterality: N/A;   CATARACT EXTRACTION W/ INTRAOCULAR LENS  IMPLANT, BILATERAL     COLONOSCOPY  2008, 2002   hx polyps/Dr. Russella Dar   EYE SURGERY     retina  detachment repair x 2   FLEXIBLE SIGMOIDOSCOPY N/A 02/19/2023   Procedure: FLEXIBLE SIGMOIDOSCOPY;  Surgeon: Andria Meuse, MD;  Location: [facility];  Service: General;  Laterality: N/A;   KNEE ARTHROSCOPY  April/2013   left   LAPAROTOMY N/A 07/16/2022   Procedure: EXPLORATORY LAPAROTOMY, HARTMAN'S PROCEDURE;  Surgeon: Gaynelle Adu, MD;  Location: [facility] OR;  Service: General;  Laterality: N/A;   LYSIS OF ADHESION N/A 02/19/2023   Procedure: LYSIS OF ADHESION;  Surgeon: Andria Meuse, MD;  Location: [facility];  Service: General;  Laterality: N/A;   TOTAL KNEE ARTHROPLASTY Right 03/26/2018   Procedure: RIGHT TOTAL KNEE ARTHROPLASTY;  Surgeon: Tarry Kos, MD;  Location: [facility];  Service: Orthopedics;   Laterality: Right;   XI ROBOTIC ASSISTED COLOSTOMY TAKEDOWN N/A 02/19/2023   Procedure: XI ROBOTIC ASSISTED COLOSTOMY REVERSAL WITH POSSIBLE LOW ANTERIOR RESECTION;  Surgeon: Andria Meuse, MD;  Location: [facility];  Service: General;  Laterality: N/A;   Current Outpatient Medications on File Prior to Visit  Medication Sig Dispense Refill   apixaban (ELIQUIS) 5 MG TABS tablet Take 1 tablet (5 mg total) by mouth 2 (two) times daily. 180 tablet 1   atorvastatin (LIPITOR) 40 MG tablet TAKE 1 TABLET BY MOUTH EVERY DAY 90 tablet 3   Budeson-Glycopyrrol-Formoterol (BREZTRI AEROSPHERE) 160-9-4.8 MCG/ACT AERO Inhale 2 puffs into the lungs 2 (two) times daily. Two wee trial     diltiazem (CARDIZEM SR) 120 MG 12 hr capsule Take 2 capsules (240 mg total) by mouth daily. 180 capsule 2   ferrous sulfate 325 (65 FE) MG tablet Take 1 tablet (325 mg total) by mouth daily with breakfast. 90 tablet 1   irbesartan (AVAPRO) 150 MG tablet TAKE 1 TABLET BY MOUTH EVERY DAY 90 tablet 1   metoprolol succinate (TOPROL-XL) 25 MG 24 hr tablet Take 1 tablet (25 mg total) by mouth daily. Take with or immediately following a meal. 90 tablet 3   No current facility-administered medications on file prior to visit.   No Known Allergies Social History   Socioeconomic History   Marital status: Married    Spouse name: Not on file   Number of children: Not on file   Years of education: Not on file   Highest education level: Not on file  Occupational History   Not on file  Tobacco Use   Smoking status: Former    Current packs/day: 0.00    Average packs/day: 1 pack/day for 60.0 years (60.0 ttl pk-yrs)    Types: Cigarettes    Start date: 07/09/1962    Quit date: 07/09/2022    Years since quitting: 1.1   Smokeless tobacco: Never   Tobacco comments:    pt to call PCP for patch  Vaping Use   Vaping status: Never Used  Substance and Sexual Activity   Alcohol use: No   Drug use: No   Sexual activity: Not on file  Other  Topics Concern   Not on file  Social History Narrative   Not on file   Social Determinants of Health   Financial Resource Strain: Not on file  Food Insecurity: No Food Insecurity (02/19/2023)   Hunger Vital Sign    Worried About Running Out of Food in the Last Year: Never true    Ran Out of Food in the Last Year: Never true  Transportation Needs: No Transportation Needs (02/19/2023)   PRAPARE - Administrator, Civil Service (Medical): No    Lack of Transportation (  Non-Medical): No  Physical Activity: Not on file  Stress: Not on file  Social Connections: Not on file  Intimate Partner Violence: Unknown (02/19/2023)   Humiliation, Afraid, Rape, and Kick questionnaire    Fear of Current or Ex-Partner: Patient declined    Emotionally Abused: No    Physically Abused: No    Sexually Abused: No      Review of Systems  All other systems reviewed and are negative.      Objective:   Physical Exam Vitals reviewed.  Constitutional:      General: He is not in acute distress.    Appearance: He is well-developed. He is not diaphoretic.  HENT:     Head: Normocephalic and atraumatic.     Right Ear: External ear normal.     Left Ear: External ear normal.     Nose: Nose normal.     Mouth/Throat:     Pharynx: No oropharyngeal exudate.  Eyes:     Conjunctiva/sclera: Conjunctivae normal.  Cardiovascular:     Rate and Rhythm: Normal rate. Rhythm irregular.     Heart sounds: Normal heart sounds. No murmur heard. Pulmonary:     Effort: Pulmonary effort is normal. No respiratory distress.     Breath sounds: Rales present. No wheezing.  Abdominal:     General: Bowel sounds are normal. There is no distension.     Palpations: Abdomen is soft.     Tenderness: There is no abdominal tenderness. There is no rebound.  Musculoskeletal:     Cervical back: Neck supple.     Right lower leg: No edema.     Left lower leg: No edema.  Lymphadenopathy:     Cervical: No cervical  adenopathy.  Skin:    Findings: No erythema or rash.           Assessment & Plan:  Dyspnea on exertion - Plan: CBC with Differential/Platelet, BASIC METABOLIC PANEL WITH GFR, Iron, Ambulatory referral to Gastroenterology  Occult blood in stools - Plan: CBC with Differential/Platelet, BASIC METABOLIC PANEL WITH GFR, Iron, Ambulatory referral to Gastroenterology I believe his dyspnea is multifactorial and related to COPD, age, and anemia.  The only test that has not been performed is an EGD.  He has not heard from gastroenterology.  Therefore I will place the referral again.  If he has not heard from gastroenterologist in a week or so I have asked him to contact me.  We will repeat an iron level and a CBC.  If the CBC has not improved and the iron level has, I would recommend an EGD to evaluate for blood loss.  If the hemoglobin has improved significantly, I believe his anemia is most likely iron deficiency and we may be able to forego the EGD.  We will try the patient on Breztri 2 puffs twice daily for emphysema.

## 2023-10-05 ENCOUNTER — Other Ambulatory Visit: Payer: Self-pay | Admitting: Student

## 2023-10-06 ENCOUNTER — Other Ambulatory Visit (HOSPITAL_BASED_OUTPATIENT_CLINIC_OR_DEPARTMENT_OTHER): Payer: Self-pay

## 2023-10-06 MED ORDER — METOPROLOL SUCCINATE ER 25 MG PO TB24: 25.0000 mg | ORAL_TABLET | Freq: Every day | ORAL | 3 refills | Status: DC

## 2023-11-03 ENCOUNTER — Ambulatory Visit (INDEPENDENT_AMBULATORY_CARE_PROVIDER_SITE_OTHER): Payer: Medicare Other

## 2023-11-03 VITALS — BP 132/78 | HR 63 | Ht 66.0 in | Wt 181.4 lb

## 2023-11-03 DIAGNOSIS — Z0000 Encounter for general adult medical examination without abnormal findings: Secondary | ICD-10-CM | POA: Diagnosis not present

## 2023-11-03 NOTE — Patient Instructions (Signed)
  Mr. Nicholas Huffman , Thank you for taking time to come for your Medicare Wellness Visit. I appreciate your ongoing commitment to your health goals. Please review the following plan we discussed and let me know if I can assist you in the future.   These are the goals we discussed:  Goals      DIET - INCREASE WATER INTAKE        This is a list of the screening recommended for you and due dates:  Health Maintenance  Topic Date Due   COVID-19 Vaccine (6 - 2023-24 season) 11/19/2023*   DTaP/Tdap/Td vaccine (3 - Td or Tdap) 11/02/2024*   Screening for Lung Cancer  07/20/2024   Medicare Annual Wellness Visit  11/02/2024   Colon Cancer Screening  06/19/2025   Pneumonia Vaccine  Completed   Flu Shot  Completed   Hepatitis C Screening  Completed   Zoster (Shingles) Vaccine  Completed   HPV Vaccine  Aged Out  *Topic was postponed. The date shown is not the original due date.

## 2023-11-03 NOTE — Progress Notes (Signed)
Subjective:   Nicholas Huffman is a 77 y.o. male who presents for Medicare Annual/Subsequent preventive examination.  Visit Complete: In person  Patient Medicare AWV questionnaire was completed by the patient on 11/03/2023; I have confirmed that all information answered by patient is correct and no changes since this date.        Objective:    Today's Vitals   11/03/23 1027  BP: 132/78  Pulse: 63  SpO2: 94%  Weight: 181 lb 6.4 oz (82.3 kg)  Height: 5\' 6"  (1.676 m)   Body mass index is 29.28 kg/m.     02/19/2023    2:00 PM 02/19/2023    6:56 AM 02/07/2023   10:35 AM 09/12/2022    8:08 AM 07/17/2022    2:21 AM 03/26/2018    7:00 PM 03/17/2018   11:29 AM  Advanced Directives  Does Patient Have a Medical Advance Directive? Yes Yes Yes Yes No Yes Yes  Type of Estate agent of [employer] Power of [place];Living will Healthcare Power of [place];Living will Healthcare Power of [place];Living will  Healthcare Power of [place];Living will Healthcare Power of [place];Living will  Does patient want to make changes to medical advance directive? No - Patient declined No - Patient declined    No - Patient declined No - Patient declined  Copy of Healthcare Power of Attorney in Chart? No - copy requested No - copy requested No - copy requested No - copy requested  No - copy requested No - copy requested  Would patient like information on creating a medical advance directive?     No - Patient declined      Current Medications (verified) Outpatient Encounter Medications as of 11/03/2023  Medication Sig   apixaban (ELIQUIS) 5 MG TABS tablet Take 1 tablet (5 mg total) by mouth 2 (two) times daily.   atorvastatin (LIPITOR) 40 MG tablet TAKE 1 TABLET BY MOUTH EVERY DAY   Budeson-Glycopyrrol-Formoterol (BREZTRI AEROSPHERE) 160-9-4.8 MCG/ACT AERO Inhale 2 puffs into the lungs 2 (two) times daily. Two wee trial   Budeson-Glycopyrrol-Formoterol (BREZTRI AEROSPHERE)  160-9-4.8 MCG/ACT AERO Inhale 2 puffs into the lungs 2 (two) times daily.   diltiazem (CARDIZEM SR) 120 MG 12 hr capsule Take 2 capsules (240 mg total) by mouth daily.   ferrous sulfate 325 (65 FE) MG tablet Take 1 tablet (325 mg total) by mouth daily with breakfast.   irbesartan (AVAPRO) 150 MG tablet TAKE 1 TABLET BY MOUTH EVERY DAY   metoprolol succinate (TOPROL-XL) 25 MG 24 hr tablet Take 1 tablet (25 mg total) by mouth daily. Take with or immediately following a meal.   No facility-administered encounter medications on file as of 11/03/2023.    Allergies (verified) Patient has no known allergies.   History: Past Medical History:  Diagnosis Date   A-fib (HCC)    Cancer (HCC)    basal cell   Cataract    Diverticulitis    Fractured skull (HCC)    as a child   Hyperlipidemia    Hypertension    Primary localized osteoarthritis of right knee    hx of- knee has been replaced   Smoker    Wears glasses    Wears hearing aid in both ears    Past Surgical History:  Procedure Laterality Date   BASAL CELL CARCINOMA EXCISION  2016   face   CARDIOVERSION N/A 09/12/2022   Procedure: CARDIOVERSION;  Surgeon: Jake Bathe, MD;  Location: [facility];  Service: Cardiovascular;  Laterality: N/A;   CATARACT EXTRACTION W/ INTRAOCULAR LENS  IMPLANT, BILATERAL     COLONOSCOPY  2008, 2002   hx polyps/Dr. Russella Dar   EYE SURGERY     retina detachment repair x 2   FLEXIBLE SIGMOIDOSCOPY N/A 02/19/2023   Procedure: FLEXIBLE SIGMOIDOSCOPY;  Surgeon: Andria Meuse, MD;  Location: [facility];  Service: General;  Laterality: N/A;   KNEE ARTHROSCOPY  April/2013   left   LAPAROTOMY N/A 07/16/2022   Procedure: EXPLORATORY LAPAROTOMY, HARTMAN'S PROCEDURE;  Surgeon: Gaynelle Adu, MD;  Location: [facility] OR;  Service: General;  Laterality: N/A;   LYSIS OF ADHESION N/A 02/19/2023   Procedure: LYSIS OF ADHESION;  Surgeon: Andria Meuse, MD;  Location: [facility];  Service: General;  Laterality: N/A;   TOTAL  KNEE ARTHROPLASTY Right 03/26/2018   Procedure: RIGHT TOTAL KNEE ARTHROPLASTY;  Surgeon: Tarry Kos, MD;  Location: [facility] OR;  Service: Orthopedics;  Laterality: Right;   XI ROBOTIC ASSISTED COLOSTOMY TAKEDOWN N/A 02/19/2023   Procedure: XI ROBOTIC ASSISTED COLOSTOMY REVERSAL WITH POSSIBLE LOW ANTERIOR RESECTION;  Surgeon: Andria Meuse, MD;  Location: [facility];  Service: General;  Laterality: N/A;   Family History  Problem Relation Age of Onset   Cancer Mother    Breast cancer Mother    Cancer Father    Colon cancer Neg Hx    Esophageal cancer Neg Hx    Rectal cancer Neg Hx    Stomach cancer Neg Hx    Social History   Socioeconomic History   Marital status: Married    Spouse name: Not on file   Number of children: Not on file   Years of education: Not on file   Highest education level: Not on file  Occupational History   Not on file  Tobacco Use   Smoking status: Former    Current packs/day: 0.00    Average packs/day: 1 pack/day for 60.0 years (60.0 ttl pk-yrs)    Types: Cigarettes    Start date: 07/09/1962    Quit date: 07/09/2022    Years since quitting: 1.3   Smokeless tobacco: Never   Tobacco comments:    pt to call PCP for patch  Vaping Use   Vaping status: Never Used  Substance and Sexual Activity   Alcohol use: No   Drug use: No   Sexual activity: Not on file  Other Topics Concern   Not on file  Social History Narrative   Not on file   Social Determinants of Health   Financial Resource Strain: Low Risk  (11/03/2023)   Overall Financial Resource Strain (CARDIA)    Difficulty of Paying Living Expenses: Not hard at all  Food Insecurity: No Food Insecurity (11/03/2023)   Hunger Vital Sign    Worried About Running Out of Food in the Last Year: Never true    Ran Out of Food in the Last Year: Never true  Transportation Needs: No Transportation Needs (11/03/2023)   PRAPARE - Administrator, Civil Service (Medical): No    Lack of Transportation  (Non-Medical): No  Physical Activity: Insufficiently Active (11/03/2023)   Exercise Vital Sign    Days of Exercise per Week: 2 days    Minutes of Exercise per Session: 20 min  Stress: No Stress Concern Present (11/03/2023)   [employer] of Occupational Health - Occupational Stress Questionnaire    Feeling of Stress : Not at all  Social Connections: Socially Integrated (11/03/2023)   Social Connection and Isolation Panel [NHANES]  Frequency of Communication with Friends and Family: More than three times a week    Frequency of Social Gatherings with Friends and Family: More than three times a week    Attends Religious Services: More than 4 times per year    Active Member of [employer] or Organizations: Yes    Attends Engineer, structural: More than 4 times per year    Marital Status: Married    Tobacco Counseling Counseling given: Not Answered Tobacco comments: pt to call PCP for patch   Clinical Intake:  Pre-visit preparation completed: Yes  Pain : No/denies pain     BMI - recorded: 29.28 Nutritional Status: BMI 25 -29 Overweight Nutritional Risks: None Diabetes: No  How often do you need to have someone help you when you read instructions, pamphlets, or other written materials from your doctor or pharmacy?: 1 - Never  Interpreter Needed?: No      Activities of Daily Living    02/19/2023    2:00 PM 02/19/2023    6:59 AM  In your present state of health, do you have any difficulty performing the following activities:  Hearing? 0 1  Vision? 0 0  Difficulty concentrating or making decisions? 0 0  Walking or climbing stairs? 0 0  Dressing or bathing? 0 0  Doing errands, shopping? 0     Patient Care Team: Donita Brooks, MD as PCP - General (Family Medicine) Jens Som Madolyn Frieze, MD as PCP - Cardiology (Cardiology)  Indicate any recent Medical Services you may have received from other than [facility] providers in the past year (date may be approximate).      Assessment:   This is a routine wellness examination for [place].  Hearing/Vision screen No results found.   Goals Addressed             This Visit's Progress    DIET - INCREASE WATER INTAKE        Depression Screen    11/03/2023   10:35 AM 11/03/2023   10:33 AM 08/25/2023   12:31 PM 08/08/2022    3:59 PM 03/13/2021    8:16 AM 01/15/2018    8:16 AM 02/13/2017    8:57 AM  PHQ 2/9 Scores  PHQ - 2 Score 0 0 0 0 0 0 0    Fall Risk    11/03/2023   10:35 AM 08/25/2023   12:31 PM 03/13/2021    8:16 AM 11/03/2019   11:45 AM 07/08/2019    3:54 PM  Fall Risk   Falls in the past year? 0 0 0 0 --  Comment    Emmi Telephone Survey: data to providers prior to load [employer] Survey: data to providers prior to load  Number falls in past yr: 0    --  Comment     Emmi Telephone Survey Actual Response =   Injury with Fall? 0      Risk for fall due to : No Fall Risks  No Fall Risks    Follow up Falls prevention discussed  Falls evaluation completed      MEDICARE RISK AT HOME:    TIMED UP AND GO:  Was the test performed?  Yes  Length of time to ambulate 10 feet: 8 sec Gait steady and fast without use of assistive device    Cognitive Function:        11/03/2023   10:35 AM  6CIT Screen  What Year? 0 points  What month? 0 points  What time?  0 points  Count back from 20 0 points  Months in reverse 2 points  Repeat phrase 0 points  Total Score 2 points    Immunizations Immunization History  Administered Date(s) Administered   Fluad Quad(high Dose 65+) 10/03/2021, 10/09/2022   Influenza Split 09/22/2013   Influenza, High Dose Seasonal PF 09/22/2014, 09/06/2016, 09/15/2017, 09/30/2018, 07/28/2019, 08/23/2020, 08/31/2023   Influenza-Unspecified 09/26/2015   Moderna Covid-19 Fall Seasonal Vaccine 26yrs & older 08/31/2023   PFIZER(Purple Top)SARS-COV-2 Vaccination 12/28/2019, 01/15/2020, 09/27/2020   Pneumococcal Conjugate-13 03/07/2016   Pneumococcal Polysaccharide-23  09/28/2009, 11/18/2014   Td 08/20/2001   Tdap 07/22/2011   Unspecified SARS-COV-2 Vaccination 10/09/2022   Zoster Recombinant(Shingrix) 04/25/2021, 08/23/2021    TDAP status: Due, Education has been provided regarding the importance of this vaccine. Advised may receive this vaccine at local pharmacy or Health Dept. Aware to provide a copy of the vaccination record if obtained from local pharmacy or Health Dept. Verbalized acceptance and understanding.  Flu Vaccine status: Up to date  Pneumococcal vaccine status: Up to date  Covid-19 vaccine status: Information provided on how to obtain vaccines.   Qualifies for Shingles Vaccine? Yes   Zostavax completed Yes   Shingrix Completed?: Yes  Screening Tests Health Maintenance  Topic Date Due   COVID-19 Vaccine (6 - 2023-24 season) 11/19/2023 (Originally 10/26/2023)   DTaP/Tdap/Td (3 - Td or Tdap) 11/02/2024 (Originally 07/21/2021)   Lung Cancer Screening  07/20/2024   Medicare Annual Wellness (AWV)  11/02/2024   Colonoscopy  06/19/2025   Pneumonia Vaccine 45+ Years old  Completed   INFLUENZA VACCINE  Completed   Hepatitis C Screening  Completed   Zoster Vaccines- Shingrix  Completed   HPV VACCINES  Aged Out    Health Maintenance  There are no preventive care reminders to display for this patient.   Colorectal cancer screening: Type of screening: Colonoscopy. Completed 06/20/2015. Repeat every 10 years  Lung Cancer Screening: (Low Dose CT Chest recommended if Age 81-80 years, 20 pack-year currently smoking OR have quit w/in 15years.) does not qualify.   Lung Cancer Screening Referral: N/A  Additional Screening:  Hepatitis C Screening: does qualify; Completed 12/30/2018  Vision Screening: Recommended annual ophthalmology exams for early detection of glaucoma and other disorders of the eye. Is the patient up to date with their annual eye exam?  No  Who is the provider or what is the name of the office in which the patient  attends annual eye exams? N/A If pt is not established with a provider, would they like to be referred to a provider to establish care? No .   Dental Screening: Recommended annual dental exams for proper oral hygiene  Diabetic Foot Exam: PT IS NOT DIABETIC  Community Resource Referral / Chronic Care Management: CRR required this visit?  No   CCM required this visit?  No     Plan:     I have personally reviewed and noted the following in the patient's chart:   Medical and social history Use of alcohol, tobacco or illicit drugs  Current medications and supplements including opioid prescriptions. Patient is not currently taking opioid prescriptions. Functional ability and status Nutritional status Physical activity Advanced directives List of other physicians Hospitalizations, surgeries, and ER visits in previous 12 months Vitals Screenings to include cognitive, depression, and falls Referrals and appointments  In addition, I have reviewed and discussed with patient certain preventive protocols, quality metrics, and best practice recommendations. A written personalized care plan for preventive services as well as  general preventive health recommendations were provided to patient.     Darral Dash, LPN   [id number]   After Visit Summary: (In Person-Printed) AVS printed and given to the patient  Nurse Notes: Discussed Tdap and how/where to obtain. Pt declines at this time.

## 2023-11-18 ENCOUNTER — Ambulatory Visit: Payer: Medicare Other | Admitting: Gastroenterology

## 2023-11-18 ENCOUNTER — Other Ambulatory Visit (INDEPENDENT_AMBULATORY_CARE_PROVIDER_SITE_OTHER): Payer: Medicare Other

## 2023-11-18 ENCOUNTER — Encounter: Payer: Self-pay | Admitting: Gastroenterology

## 2023-11-18 VITALS — BP 118/70 | HR 68 | Ht 66.0 in | Wt 182.0 lb

## 2023-11-18 DIAGNOSIS — K5521 Angiodysplasia of colon with hemorrhage: Secondary | ICD-10-CM | POA: Diagnosis not present

## 2023-11-18 DIAGNOSIS — D500 Iron deficiency anemia secondary to blood loss (chronic): Secondary | ICD-10-CM

## 2023-11-18 DIAGNOSIS — Z860101 Personal history of adenomatous and serrated colon polyps: Secondary | ICD-10-CM

## 2023-11-18 DIAGNOSIS — R195 Other fecal abnormalities: Secondary | ICD-10-CM | POA: Diagnosis not present

## 2023-11-18 DIAGNOSIS — Z7901 Long term (current) use of anticoagulants: Secondary | ICD-10-CM | POA: Diagnosis not present

## 2023-11-18 LAB — CBC WITH DIFFERENTIAL/PLATELET
Basophils Absolute: 0.1 10*3/uL (ref 0.0–0.1)
Basophils Relative: 1.4 % (ref 0.0–3.0)
Eosinophils Absolute: 0.2 10*3/uL (ref 0.0–0.7)
Eosinophils Relative: 3.5 % (ref 0.0–5.0)
HCT: 46.2 % (ref 39.0–52.0)
Hemoglobin: 14.7 g/dL (ref 13.0–17.0)
Lymphocytes Relative: 16.1 % (ref 12.0–46.0)
Lymphs Abs: 1.1 10*3/uL (ref 0.7–4.0)
MCHC: 31.9 g/dL (ref 30.0–36.0)
MCV: 88.3 fL (ref 78.0–100.0)
Monocytes Absolute: 0.6 10*3/uL (ref 0.1–1.0)
Monocytes Relative: 8.4 % (ref 3.0–12.0)
Neutro Abs: 4.7 10*3/uL (ref 1.4–7.7)
Neutrophils Relative %: 70.6 % (ref 43.0–77.0)
Platelets: 268 10*3/uL (ref 150.0–400.0)
RBC: 5.24 Mil/uL (ref 4.22–5.81)
RDW: 15.1 % (ref 11.5–15.5)
WBC: 6.7 10*3/uL (ref 4.0–10.5)

## 2023-11-18 LAB — IBC + FERRITIN
Ferritin: 12 ng/mL — ABNORMAL LOW (ref 22.0–322.0)
Iron: 45 ug/dL (ref 42–165)
Saturation Ratios: 12.4 % — ABNORMAL LOW (ref 20.0–50.0)
TIBC: 364 ug/dL (ref 250.0–450.0)
Transferrin: 260 mg/dL (ref 212.0–360.0)

## 2023-11-18 NOTE — Patient Instructions (Signed)
Your provider has requested that you go to the basement level for lab work before leaving today. Press "B" on the elevator. The lab is located at the first door on the left as you exit the elevator.  Due to recent changes in healthcare laws, you may see the results of your imaging and laboratory studies on MyChart before your provider has had a chance to review them.  We understand that in some cases there may be results that are confusing or concerning to you. Not all laboratory results come back in the same time frame and the provider may be waiting for multiple results in order to interpret others.  Please give [place] 48 hours in order for your provider to thoroughly review all the results before contacting the office for clarification of your results.   Thank you for choosing me and [facility].  Pricilla Riffle. Dagoberto Ligas., MD., Marval Regal

## 2023-11-18 NOTE — Progress Notes (Signed)
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 Doy Hutching   HPI    This is a 77year old male who is FIT positive with iron deficiency anemia.  He is accompanied by his wife.  He has a history of tubulovillous adenomas and sessile serrated adenomas.  He is S/P sigmoid colectomy, colostomy, Hartmann's pouch and colostomy takedown for perforated sigmoid diverticulitis.  He has multiple colonic AVMs.  Following his colostomy takedown he was noted to be anemic, iron deficient, and he states he took iron for 1 month and then discontinued.  His hemoglobin returned to normal in September.  He has no gastrointestinal complaints.  Denies weight loss, abdominal pain, constipation, diarrhea, change in stool caliber, melena, hematochezia, nausea, vomiting, dysphagia, reflux symptoms, chest pain.   Barium enema Mar 2024 [facility] pouch, with a few small colonic diverticulae at the proximal end of the pouch in the distal sigmoid region.  No evidence of diverticulitis or perforation.  Colonoscopy July 2023 - Multiple non-bleeding colonic angiodysplastic lesions.  - A tattoo was seen in the ascending colon. The tattoo site appeared normal.  - Six 6 to 9 mm polyps in the sigmoid colon, in the descending colon and in the transverse colon, removed with a cold snare. Resected and retrieved.  - Moderate diverticulosis in the left colon.  - Anal papilla(e) were hypertrophied.  - Internal hemorrhoids.  - The examination was  otherwise normal on direct and retroflexion views.   Labs / Imaging       Latest Ref Rng & Units 07/11/2023    9:21 AM 02/07/2023   10:49 AM 08/08/2022    4:25 PM  Hepatic Function  Total Protein 6.1 - 8.1 g/dL 6.8  7.2  6.1   Albumin 3.5 - 5.0 g/dL  3.8    AST 10 - 35 U/L 17  21  17    ALT 9 - 46 U/L 16  21  27    Alk Phosphatase 38 - 126 U/L  79    Total Bilirubin 0.2 - 1.2 mg/dL 0.7  0.6  0.5        Latest Ref Rng & Units 08/25/2023   12:47 PM 07/11/2023    9:21 AM 03/14/2023   10:13 AM  CBC  WBC 3.8 - 10.8 Thousand/uL 5.9  6.9  6.5   Hemoglobin 13.2 - 17.1 g/dL 66.4  40.3  47.4   Hematocrit 38.5 - 50.0 % 44.5  35.3  33.4   Platelets 140 - 400 Thousand/uL 262  343  448    Current Medications, Allergies, Past Medical History, Past Surgical History, Family History and Social History were reviewed in [employer] record.   Physical Exam: General: Well developed, well nourished, no acute distress Head: Normocephalic and atraumatic Eyes: Sclerae anicteric, EOMI Ears: Normal auditory acuity Mouth: No deformities or lesions noted Lungs: Clear throughout to auscultation Heart: Regular rate and rhythm; No murmurs, rubs or bruits Abdomen: Soft, non tender and non distended. No masses, hepatosplenomegaly or hernias noted. Normal Bowel sounds Rectal: Not done Musculoskeletal: Symmetrical  with no gross deformities  Pulses:  Normal pulses noted Extremities: No edema or deformities noted Neurological: Alert oriented x 4, grossly nonfocal Psychological:  Alert and cooperative. Normal mood and affect   Nicholas Labuda T. Russella Dar, MD 11/18/2023, 8:23 AM

## 2023-11-19 ENCOUNTER — Other Ambulatory Visit: Payer: Self-pay

## 2023-11-19 ENCOUNTER — Other Ambulatory Visit: Payer: Self-pay | Admitting: Family Medicine

## 2023-11-19 DIAGNOSIS — D500 Iron deficiency anemia secondary to blood loss (chronic): Secondary | ICD-10-CM

## 2023-11-19 MED ORDER — FERROUS SULFATE 325 (65 FE) MG PO TABS: 325.0000 mg | ORAL_TABLET | Freq: Every day | ORAL | 2 refills | Status: AC

## 2023-12-28 ENCOUNTER — Other Ambulatory Visit: Payer: Self-pay | Admitting: Cardiology

## 2024-01-20 ENCOUNTER — Other Ambulatory Visit: Payer: Self-pay | Admitting: Cardiology

## 2024-01-28 ENCOUNTER — Ambulatory Visit: Payer: Medicare Other | Attending: Nurse Practitioner | Admitting: Nurse Practitioner

## 2024-01-28 ENCOUNTER — Encounter: Payer: Self-pay | Admitting: Nurse Practitioner

## 2024-01-28 VITALS — BP 118/74 | HR 64 | Ht 66.0 in | Wt 187.0 lb

## 2024-01-28 DIAGNOSIS — I10 Essential (primary) hypertension: Secondary | ICD-10-CM

## 2024-01-28 DIAGNOSIS — E782 Mixed hyperlipidemia: Secondary | ICD-10-CM | POA: Diagnosis not present

## 2024-01-28 DIAGNOSIS — R0609 Other forms of dyspnea: Secondary | ICD-10-CM

## 2024-01-28 DIAGNOSIS — I480 Paroxysmal atrial fibrillation: Secondary | ICD-10-CM

## 2024-01-28 DIAGNOSIS — I4819 Other persistent atrial fibrillation: Secondary | ICD-10-CM | POA: Diagnosis not present

## 2024-01-28 MED ORDER — APIXABAN 5 MG PO TABS: 5.0000 mg | ORAL_TABLET | Freq: Two times a day (BID) | ORAL | 3 refills | Status: AC

## 2024-01-28 MED ORDER — DILTIAZEM HCL ER 120 MG PO CP12: 240.0000 mg | ORAL_CAPSULE | Freq: Every day | ORAL | 3 refills | Status: AC

## 2024-01-28 MED ORDER — ATORVASTATIN CALCIUM 40 MG PO TABS: 40.0000 mg | ORAL_TABLET | Freq: Every day | ORAL | 3 refills | Status: AC

## 2024-01-28 MED ORDER — IRBESARTAN 150 MG PO TABS: 150.0000 mg | ORAL_TABLET | Freq: Every day | ORAL | 3 refills | Status: AC

## 2024-01-28 MED ORDER — METOPROLOL SUCCINATE ER 25 MG PO TB24: 25.0000 mg | ORAL_TABLET | Freq: Every day | ORAL | 3 refills | Status: AC

## 2024-01-28 NOTE — Patient Instructions (Addendum)
Medication Instructions:  Your physician recommends that you continue on your current medications as directed. Please refer to the Current Medication list given to you today.  *If you need a refill on your cardiac medications before your next appointment, please call your pharmacy*   Lab Work: NONE ordered at this time of appointment   Testing/Procedures: NONE ordered at this time of appointment   Follow-Up: At [facility], you and your health needs are our priority.  As part of our continuing mission to provide you with exceptional heart care, we have created designated Provider Care Teams.  These Care Teams include your primary Cardiologist (physician) and Advanced Practice Providers (APPs -  Physician Assistants and Nurse Practitioners) who all work together to provide you with the care you need, when you need it.  We recommend signing up for the patient portal called "MyChart".  Sign up information is provided on this After Visit Summary.  MyChart is used to connect with patients for Virtual Visits (Telemedicine).  Patients are able to view lab/test results, encounter notes, upcoming appointments, etc.  Non-urgent messages can be sent to your provider as well.   To learn more about what you can do with MyChart, go to [email or web address].    Your next appointment:   6 months   Provider:    Dr. Jens Som or Bernadene Person NP

## 2024-01-28 NOTE — Progress Notes (Signed)
Office Visit    Patient Name: Nicholas Huffman Date of Encounter: 01/28/2024  Primary Care Provider:  Donita Brooks, MD Primary Cardiologist:  Olga Millers, MD  Chief Complaint    78 year old male with a history of persistent atrial fibrillation, dyspnea on exertion, hypertension, hyperlipidemia, and former tobacco use who presents for follow-up related to atrial fibrillation and dyspnea on exertion.  Past Medical History    Past Medical History:  Diagnosis Date   A-fib (HCC)    Cancer (HCC)    basal cell   Cataract    Diverticulitis    Fractured skull (HCC)    as a child   Hyperlipidemia    Hypertension    Primary localized osteoarthritis of right knee    hx of- knee has been replaced   Smoker    Wears glasses    Wears hearing aid in both ears    Past Surgical History:  Procedure Laterality Date   BASAL CELL CARCINOMA EXCISION  2016   face   CARDIOVERSION N/A 09/12/2022   Procedure: CARDIOVERSION;  Surgeon: Jake Bathe, MD;  Location: [facility];  Service: Cardiovascular;  Laterality: N/A;   CATARACT EXTRACTION W/ INTRAOCULAR LENS  IMPLANT, BILATERAL     COLONOSCOPY  2008, 2002   hx polyps/Dr. Russella Dar   EYE SURGERY     retina detachment repair x 2   FLEXIBLE SIGMOIDOSCOPY N/A 02/19/2023   Procedure: FLEXIBLE SIGMOIDOSCOPY;  Surgeon: Andria Meuse, MD;  Location: [facility];  Service: General;  Laterality: N/A;   KNEE ARTHROSCOPY  April/2013   left   LAPAROTOMY N/A 07/16/2022   Procedure: EXPLORATORY LAPAROTOMY, HARTMAN'S PROCEDURE;  Surgeon: Gaynelle Adu, MD;  Location: [facility] OR;  Service: General;  Laterality: N/A;   LYSIS OF ADHESION N/A 02/19/2023   Procedure: LYSIS OF ADHESION;  Surgeon: Andria Meuse, MD;  Location: [facility];  Service: General;  Laterality: N/A;   TOTAL KNEE ARTHROPLASTY Right 03/26/2018   Procedure: RIGHT TOTAL KNEE ARTHROPLASTY;  Surgeon: Tarry Kos, MD;  Location: [facility] OR;  Service: Orthopedics;  Laterality: Right;   XI ROBOTIC  ASSISTED COLOSTOMY TAKEDOWN N/A 02/19/2023   Procedure: XI ROBOTIC ASSISTED COLOSTOMY REVERSAL WITH POSSIBLE LOW ANTERIOR RESECTION;  Surgeon: Andria Meuse, MD;  Location: [facility];  Service: General;  Laterality: N/A;    Allergies  No Known Allergies   Labs/Other Studies Reviewed    The following studies were reviewed today:  Cardiac Studies & Procedures   ______________________________________________________________________________________________   STRESS TESTS  MYOCARDIAL PERFUSION IMAGING 08/01/2023  Narrative   Findings are consistent with no ischemia. The study is intermediate risk.   No ST deviation was noted.   LV perfusion is abnormal. Defect 1: There is a small defect with moderate reduction in uptake present in the apical apex location(s) that is fixed. There is normal wall motion in the defect area. Consistent with artifact.   Left ventricular function is low-normal. End diastolic cavity size is mildly enlarged. End systolic cavity size is normal.   Prior study not available for comparison.  Apical perfusion defect in rests and stress. No wall motion abnormality; there is prominent diaphragmatic attenuation in all views.  Suspect artifact, but cannot rule out apical infarct. Low normal LV function with visual dilation of the LV cavity.  Specificity of these findings is decreased when imaging in atrial fibrillation. No ischemia.   ECHOCARDIOGRAM  ECHOCARDIOGRAM COMPLETE 07/19/2022  Narrative ECHOCARDIOGRAM REPORT    Patient Name:   Nicholas Huffman  Date of Exam: 07/19/2022 Medical Rec #:  [id number]     Height:       67.0 in Accession #:    [id number]    Weight:       159.6 lb Date of Birth:  [birth year]     BSA:          1.837 m Patient Age:    75 years      BP:           129/78 mmHg Patient Gender: M             HR:           98 bpm. Exam Location:  Inpatient  Procedure: 2D Echo, Cardiac Doppler, Color Doppler and Intracardiac Opacification  Agent  Indications:    Atrial fibrillation  History:        Patient has no prior history of Echocardiogram examinations. Signs/Symptoms:Shortness of Breath.  Sonographer:    Roosvelt Maser RDCS Referring Phys: [id number] BRIAN S CRENSHAW  IMPRESSIONS   1. Left ventricular ejection fraction, by estimation, is 65 to 70%. The left ventricle has normal function. The left ventricle has no regional wall motion abnormalities. Left ventricular diastolic function could not be evaluated. 2. Right ventricular systolic function is normal. The right ventricular size is normal. Tricuspid regurgitation signal is inadequate for assessing PA pressure. 3. The mitral valve is normal in structure. No evidence of mitral valve regurgitation. No evidence of mitral stenosis. 4. The aortic valve was not well visualized. Aortic valve regurgitation is not visualized. No aortic stenosis is present. 5. Aortic dilatation noted. There is mild dilatation of the aortic root, measuring 38 mm.  FINDINGS Left Ventricle: Left ventricular ejection fraction, by estimation, is 65 to 70%. The left ventricle has normal function. The left ventricle has no regional wall motion abnormalities. Definity contrast agent was given IV to delineate the left ventricular endocardial borders. The left ventricular internal cavity size was normal in size. There is no left ventricular hypertrophy. Left ventricular diastolic function could not be evaluated due to atrial fibrillation. Left ventricular diastolic function could not be evaluated.  Right Ventricle: The right ventricular size is normal. No increase in right ventricular wall thickness. Right ventricular systolic function is normal. Tricuspid regurgitation signal is inadequate for assessing PA pressure.  Left Atrium: Left atrial size was normal in size.  Right Atrium: Right atrial size was normal in size.  Pericardium: There is no evidence of pericardial effusion.  Mitral Valve: The mitral  valve is normal in structure. No evidence of mitral valve regurgitation. No evidence of mitral valve stenosis.  Tricuspid Valve: The tricuspid valve is normal in structure. Tricuspid valve regurgitation is not demonstrated. No evidence of tricuspid stenosis.  Aortic Valve: The aortic valve was not well visualized. Aortic valve regurgitation is not visualized. No aortic stenosis is present.  Pulmonic Valve: The pulmonic valve was normal in structure. Pulmonic valve regurgitation is not visualized. No evidence of pulmonic stenosis.  Aorta: Aortic dilatation noted. There is mild dilatation of the aortic root, measuring 38 mm.  Venous: The inferior vena cava was not well visualized.  IAS/Shunts: No atrial level shunt detected by color flow Doppler.   LEFT VENTRICLE PLAX 2D LVIDd:         4.80 cm LVIDs:         3.40 cm LV PW:         1.20 cm LV IVS:        0.90 cm LVOT diam:  2.20 cm LV SV:         44 LV SV Index:   24 LVOT Area:     3.80 cm   LEFT ATRIUM           Index        RIGHT ATRIUM           Index LA diam:      4.50 cm 2.45 cm/m   RA Area:     16.40 cm LA Vol (A4C): 76.0 ml 41.36 ml/m  RA Volume:   44.50 ml  24.22 ml/m AORTIC VALVE LVOT Vmax:   87.40 cm/s LVOT Vmean:  61.000 cm/s LVOT VTI:    0.115 m  AORTA Ao Root diam: 3.80 cm Ao Asc diam:  3.30 cm   SHUNTS Systemic VTI:  0.12 m Systemic Diam: 2.20 cm  Armanda Magic MD Electronically signed by Armanda Magic MD Signature Date/Time: 07/19/2022/5:12:13 PM    Final          ______________________________________________________________________________________________     Recent Labs: 07/11/2023: ALT 16 08/25/2023: BUN 26; Creat 1.33; Potassium 4.3; Sodium 136 11/18/2023: Hemoglobin 14.7; Platelets 268.0  Recent Lipid Panel    Component Value Date/Time   CHOL 103 07/11/2023 0921   TRIG 118 07/11/2023 0921   HDL 49 07/11/2023 0921   CHOLHDL 2.1 07/11/2023 0921   VLDL 24 02/13/2017 0922    LDLCALC 34 07/11/2023 0921    History of Present Illness   78 year old male with the above past medical history including persistent atrial fibrillation, dyspnea on exertion, hypertension, hyperlipidemia, and former tobacco use.  Abdominal ultrasound in April 2021 showed no aneurysm.  He was hospitalized in August 2023 with perforated sigmoid diverticulitis/peritonitis requiring surgical intervention.  He developed postop atrial fibrillation.  Echocardiogram at the time showed normal LV function.  He underwent successful DCCV in October 2023.  Unfortunately, he had early recurrence of atrial fibrillation, rate control was subsequently pursued.  He was last seen in the office on 07/29/2023 and was stable overall from a cardiac standpoint.  He did note some dyspnea on exertion.  Lexiscan myoview in 07/2023 showed low normal LV function (EF 51%), no evidence of ischemia.  It was noted that his dyspnea was likely related to COPD.  He presents today for follow-up accompanied by his wife.  Since his last visit he has done well from a cardiac standpoint.  He denies any chest pain, palpitations, dizziness, dyspnea, edema, PND, orthopnea, weight gain.  Overall, he reports feeling well.  Home Medications    Current Outpatient Medications  Medication Sig Dispense Refill   apixaban (ELIQUIS) 5 MG TABS tablet Take 1 tablet (5 mg total) by mouth 2 (two) times daily. 180 tablet 1   atorvastatin (LIPITOR) 40 MG tablet TAKE 1 TABLET BY MOUTH EVERY DAY 90 tablet 3   Budeson-Glycopyrrol-Formoterol (BREZTRI AEROSPHERE) 160-9-4.8 MCG/ACT AERO Inhale 2 puffs into the lungs 2 (two) times daily. Two wee trial     diltiazem (CARDIZEM SR) 120 MG 12 hr capsule TAKE 2 CAPSULES BY MOUTH DAILY. 180 capsule 2   irbesartan (AVAPRO) 150 MG tablet TAKE 1 TABLET BY MOUTH EVERY DAY 90 tablet 2   metoprolol succinate (TOPROL-XL) 25 MG 24 hr tablet Take 1 tablet (25 mg total) by mouth daily. Take with or immediately following a meal.  90 tablet 3   Budeson-Glycopyrrol-Formoterol (BREZTRI AEROSPHERE) 160-9-4.8 MCG/ACT AERO Inhale 2 puffs into the lungs 2 (two) times daily. (Patient not taking: Reported on 01/28/2024) 10.7 g 11  ferrous sulfate 325 (65 FE) MG tablet Take 1 tablet (325 mg total) by mouth daily with breakfast. (Patient not taking: Reported on 01/28/2024) 30 tablet 2   No current facility-administered medications for this visit.     Review of Systems    He denies chest pain, palpitations, dyspnea, pnd, orthopnea, n, v, dizziness, syncope, edema, weight gain, or early satiety. All other systems reviewed and are otherwise negative except as noted above.   Physical Exam    VS:  BP 118/74 (BP Location: Left Arm, Patient Position: Sitting)   Pulse 64   Ht 5\' 6"  (1.676 m)   Wt 187 lb (84.8 kg)   SpO2 (!) 89%   BMI 30.18 kg/m   GEN: Well nourished, well developed, in no acute distress. HEENT: normal. Neck: Supple, no JVD, carotid bruits, or masses. Cardiac: IRIR, no murmurs, rubs, or gallops. No clubbing, cyanosis, edema.  Radials/DP/PT 2+ and equal bilaterally.  Respiratory:  Respirations regular and unlabored, clear to auscultation bilaterally. GI: Soft, nontender, nondistended, BS + x 4. MS: no deformity or atrophy. Skin: warm and dry, no rash. Neuro:  Strength and sensation are intact. Psych: Normal affect.  Accessory Clinical Findings    ECG personally reviewed by me today - EKG Interpretation Date/Time:  Wednesday January 28 2024 11:32:15 EST Ventricular Rate:  64 PR Interval:    QRS Duration:  102 QT Interval:  396 QTC Calculation: 408 R Axis:   19  Text Interpretation: Atrial fibrillation Cannot rule out Anterior infarct , age undetermined When compared with ECG of 29-Jul-2023 09:49, No significant change was found Confirmed by Bernadene Person ([id number]) on 01/28/2024 11:54:33 AM  - no acute changes.   Lab Results  Component Value Date   WBC 6.7 11/18/2023   HGB 14.7 11/18/2023   HCT 46.2  11/18/2023   MCV 88.3 11/18/2023   PLT 268.0 11/18/2023   Lab Results  Component Value Date   CREATININE 1.33 (H) 08/25/2023   BUN 26 (H) 08/25/2023   NA 136 08/25/2023   K 4.3 08/25/2023   CL 101 08/25/2023   CO2 27 08/25/2023   Lab Results  Component Value Date   ALT 16 07/11/2023   AST 17 07/11/2023   ALKPHOS 79 02/07/2023   BILITOT 0.7 07/11/2023   Lab Results  Component Value Date   CHOL 103 07/11/2023   HDL 49 07/11/2023   LDLCALC 34 07/11/2023   TRIG 118 07/11/2023   CHOLHDL 2.1 07/11/2023    No results found for: "HGBA1C"  Assessment & Plan    1. Persistent atrial fibrillation: S/p DCCV on 09/2022 with early recurrence of atrial fibrillation, now rate controlled.  Asymptomatic.  Continue metoprolol, diltiazem, Eliquis.  2. Dyspnea on exertion: Echocardiogram in 2023 showed normal LV function.  Lexiscan Myoview in 07/2023 showed low normal LV function (EF 51%), no evidence of ischemia.  He denies any recent dyspnea.  Prior symptoms were noted to be possibly in the setting of chronic lung changes/COPD.  Cardiac workup to date reassuring.  Continue to monitor for worsening symptoms.  3. Hypertension: BP well controlled. Continue current antihypertensive regimen.   4. Hyperlipidemia: LDL was 34 in 07/2023, well-controlled.  Continue Lipitor.  5. Disposition: Follow-up in 6 months.       Joylene Grapes, NP 01/28/2024, 12:03 PM

## 2024-03-16 ENCOUNTER — Encounter: Payer: Self-pay | Admitting: Family Medicine

## 2024-04-18 ENCOUNTER — Other Ambulatory Visit: Payer: Self-pay | Admitting: Nurse Practitioner

## 2024-09-02 ENCOUNTER — Other Ambulatory Visit (HOSPITAL_COMMUNITY): Payer: Self-pay

## 2024-09-02 ENCOUNTER — Telehealth: Payer: Self-pay | Admitting: Cardiology

## 2024-09-02 NOTE — Telephone Encounter (Signed)
 The only PAP application option will require patient to pay into their deductible some before they will qualify for BMS PAP. Best option may be requesting a medicare payment plan. Patient can call the customer service number in their insurance card and ask to setup a medicare payment plan option.

## 2024-09-02 NOTE — Telephone Encounter (Signed)
 Pt c/o medication issue:  1. Name of Medication: apixaban  (ELIQUIS ) 5 MG TABS tablet as written  2. How are you currently taking this medication (dosage and times per day)? As written  3. Are you having a reaction (difficulty breathing--STAT)? no  4. What is your medication issue? Discount has run out and the medication will cost them over $400. She wants to know is there anything to help with the cost.

## 2024-09-16 NOTE — Telephone Encounter (Signed)
Call has been addressed. °

## 2024-10-12 ENCOUNTER — Telehealth: Payer: Self-pay

## 2024-10-12 ENCOUNTER — Ambulatory Visit

## 2024-10-12 DIAGNOSIS — Z23 Encounter for immunization: Secondary | ICD-10-CM

## 2024-10-12 NOTE — Addendum Note (Signed)
 Addended by: CORINNA BRISKER R on: 10/12/2024 02:26 PM   Modules accepted: Orders

## 2024-10-12 NOTE — Progress Notes (Signed)
 Patient is in office today for a nurse visit for high dose Fluzone Immunization. Patient Injection was given in the  Left deltoid. Patient tolerated injection well.

## 2024-10-12 NOTE — Telephone Encounter (Signed)
 Copied from CRM [id number]. Topic: Clinical - Medication Question >> Oct 12, 2024 11:10 AM Emylou G wrote: Reason for CRM: Patient tried to get covid shot at [facility].. they said has to go through us  for ins reasons?  Can we add it to his flu shot today?  Pls advise patient

## 2024-10-15 ENCOUNTER — Other Ambulatory Visit: Payer: Self-pay | Admitting: Nurse Practitioner

## 2024-10-16 ENCOUNTER — Other Ambulatory Visit: Payer: Self-pay | Admitting: Family Medicine

## 2024-10-16 NOTE — Telephone Encounter (Signed)
 Requested medication (s) are due for refill today: Yes  Requested medication (s) are on the active medication list: Yes  Last refill:  08/25/23  Future visit scheduled: No  Notes to clinic:  Unable to refill due to no refill protocol for this medication.      Requested Prescriptions  Pending Prescriptions Disp Refills   BREZTRI  AEROSPHERE 160-9-4.8 MCG/ACT AERO inhaler [Pharmacy Med Name: BREZTRI  AEROSPHERE INHALER] 10.7 each 11    Sig: INHALE 2 PUFFS INTO THE LUNGS TWICE A DAY     Off-Protocol Failed - 10/16/2024 11:24 AM      Failed - Medication not assigned to a protocol, review manually.      Passed - Valid encounter within last 12 months    Recent Outpatient Visits           1 year ago Dyspnea on exertion   [place] [facility] Pickard, Butler DASEN, MD   1 year ago Atrial fibrillation, unspecified type [facility])   [place] [facility] Duanne Butler DASEN, MD   2 years ago Abnormal breath sounds   [place] [facility] Pickard, Butler DASEN, MD

## 2024-11-16 ENCOUNTER — Telehealth: Payer: Self-pay | Admitting: Acute Care

## 2024-11-16 NOTE — Telephone Encounter (Signed)
 Returned call to patient who was inquiring about LDCT. Advised since he has turned 78 yo, Medicare will not authorize a lung screening scan.  If interested in future CT chest scans, he should discuss with his PCP if other type is recommended for him.  The PCP would need to advise.  Offered to message PCP of ineligible with Medicare but patient states he will contact the office on his own.
